# Patient Record
Sex: Female | Born: 1945 | Race: White | Hispanic: No | Marital: Married | Smoking: Never smoker
Health system: Southern US, Community
[De-identification: ages and names within clinical notes are randomized; demographics above are authoritative.]

## PROBLEM LIST (undated history)

## (undated) DIAGNOSIS — I495 Sick sinus syndrome: Secondary | ICD-10-CM

## (undated) DIAGNOSIS — C50412 Malignant neoplasm of upper-outer quadrant of left female breast: Secondary | ICD-10-CM

## (undated) DIAGNOSIS — E079 Disorder of thyroid, unspecified: Secondary | ICD-10-CM

## (undated) DIAGNOSIS — E039 Hypothyroidism, unspecified: Secondary | ICD-10-CM

## (undated) DIAGNOSIS — J309 Allergic rhinitis, unspecified: Secondary | ICD-10-CM

## (undated) DIAGNOSIS — K922 Gastrointestinal hemorrhage, unspecified: Secondary | ICD-10-CM

## (undated) DIAGNOSIS — K219 Gastro-esophageal reflux disease without esophagitis: Secondary | ICD-10-CM

## (undated) DIAGNOSIS — E785 Hyperlipidemia, unspecified: Secondary | ICD-10-CM

## (undated) DIAGNOSIS — I10 Essential (primary) hypertension: Secondary | ICD-10-CM

## (undated) DIAGNOSIS — Z950 Presence of cardiac pacemaker: Secondary | ICD-10-CM

## (undated) DIAGNOSIS — M1990 Unspecified osteoarthritis, unspecified site: Secondary | ICD-10-CM

## (undated) DIAGNOSIS — Z803 Family history of malignant neoplasm of breast: Secondary | ICD-10-CM

## (undated) DIAGNOSIS — Z923 Personal history of irradiation: Secondary | ICD-10-CM

## (undated) DIAGNOSIS — Z800 Family history of malignant neoplasm of digestive organs: Secondary | ICD-10-CM

## (undated) DIAGNOSIS — K279 Peptic ulcer, site unspecified, unspecified as acute or chronic, without hemorrhage or perforation: Secondary | ICD-10-CM

## (undated) DIAGNOSIS — Z85828 Personal history of other malignant neoplasm of skin: Secondary | ICD-10-CM

## (undated) DIAGNOSIS — M519 Unspecified thoracic, thoracolumbar and lumbosacral intervertebral disc disorder: Secondary | ICD-10-CM

## (undated) HISTORY — DX: Sick sinus syndrome: I49.5

## (undated) HISTORY — DX: Allergic rhinitis, unspecified: J30.9

## (undated) HISTORY — DX: Gastro-esophageal reflux disease without esophagitis: K21.9

## (undated) HISTORY — DX: Unspecified thoracic, thoracolumbar and lumbosacral intervertebral disc disorder: M51.9

## (undated) HISTORY — DX: Gastrointestinal hemorrhage, unspecified: K92.2

## (undated) HISTORY — PX: HERNIA REPAIR: SHX51

## (undated) HISTORY — DX: Presence of cardiac pacemaker: Z95.0

## (undated) HISTORY — DX: Hyperlipidemia, unspecified: E78.5

## (undated) HISTORY — DX: Family history of malignant neoplasm of breast: Z80.3

## (undated) HISTORY — PX: BREAST SURGERY: SHX581

## (undated) HISTORY — DX: Peptic ulcer, site unspecified, unspecified as acute or chronic, without hemorrhage or perforation: K27.9

## (undated) HISTORY — PX: ABDOMINAL HYSTERECTOMY: SHX81

## (undated) HISTORY — DX: Personal history of other malignant neoplasm of skin: Z85.828

## (undated) HISTORY — PX: PACEMAKER INSERTION: SHX728

## (undated) HISTORY — DX: Malignant neoplasm of upper-outer quadrant of left female breast: C50.412

## (undated) HISTORY — DX: Family history of malignant neoplasm of digestive organs: Z80.0

## (undated) HISTORY — DX: Hypothyroidism, unspecified: E03.9

---

## 1998-05-30 ENCOUNTER — Encounter: Admission: RE | Admit: 1998-05-30 | Discharge: 1998-06-21 | Payer: Self-pay | Admitting: Family Medicine

## 1999-06-12 ENCOUNTER — Encounter: Admission: RE | Admit: 1999-06-12 | Discharge: 1999-06-12 | Payer: Self-pay | Admitting: Obstetrics and Gynecology

## 1999-06-12 ENCOUNTER — Encounter: Payer: Self-pay | Admitting: Obstetrics and Gynecology

## 1999-07-24 ENCOUNTER — Encounter: Admission: RE | Admit: 1999-07-24 | Discharge: 1999-07-24 | Payer: Self-pay | Admitting: General Surgery

## 1999-07-24 ENCOUNTER — Encounter: Payer: Self-pay | Admitting: General Surgery

## 1999-07-26 ENCOUNTER — Ambulatory Visit (HOSPITAL_BASED_OUTPATIENT_CLINIC_OR_DEPARTMENT_OTHER): Admission: RE | Admit: 1999-07-26 | Discharge: 1999-07-26 | Payer: Self-pay | Admitting: General Surgery

## 1999-08-27 ENCOUNTER — Encounter: Admission: RE | Admit: 1999-08-27 | Discharge: 1999-08-27 | Payer: Self-pay | Admitting: Family Medicine

## 1999-08-27 ENCOUNTER — Encounter: Payer: Self-pay | Admitting: Family Medicine

## 1999-09-27 ENCOUNTER — Ambulatory Visit (HOSPITAL_BASED_OUTPATIENT_CLINIC_OR_DEPARTMENT_OTHER): Admission: RE | Admit: 1999-09-27 | Discharge: 1999-09-27 | Payer: Self-pay | Admitting: General Surgery

## 2000-01-11 ENCOUNTER — Inpatient Hospital Stay (HOSPITAL_COMMUNITY): Admission: EM | Admit: 2000-01-11 | Discharge: 2000-01-15 | Payer: Self-pay | Admitting: Emergency Medicine

## 2000-01-11 ENCOUNTER — Encounter: Payer: Self-pay | Admitting: Dentistry

## 2000-01-12 ENCOUNTER — Encounter: Payer: Self-pay | Admitting: Cardiology

## 2000-01-13 ENCOUNTER — Encounter: Payer: Self-pay | Admitting: Cardiology

## 2000-01-14 ENCOUNTER — Encounter: Payer: Self-pay | Admitting: Interventional Cardiology

## 2000-01-14 ENCOUNTER — Encounter: Payer: Self-pay | Admitting: Neurology

## 2000-01-14 ENCOUNTER — Encounter: Payer: Self-pay | Admitting: Neurosurgery

## 2000-01-15 ENCOUNTER — Encounter: Payer: Self-pay | Admitting: Interventional Cardiology

## 2000-01-16 ENCOUNTER — Encounter: Payer: Self-pay | Admitting: Interventional Cardiology

## 2000-02-17 ENCOUNTER — Encounter: Admission: RE | Admit: 2000-02-17 | Discharge: 2000-05-17 | Payer: Self-pay | Admitting: Anesthesiology

## 2001-01-25 ENCOUNTER — Encounter: Admission: RE | Admit: 2001-01-25 | Discharge: 2001-01-25 | Payer: Self-pay | Admitting: Family Medicine

## 2001-01-25 ENCOUNTER — Encounter: Payer: Self-pay | Admitting: Family Medicine

## 2001-03-26 ENCOUNTER — Ambulatory Visit (HOSPITAL_COMMUNITY): Admission: RE | Admit: 2001-03-26 | Discharge: 2001-03-26 | Payer: Self-pay | Admitting: Gastroenterology

## 2001-06-21 ENCOUNTER — Ambulatory Visit (HOSPITAL_COMMUNITY): Admission: RE | Admit: 2001-06-21 | Discharge: 2001-06-21 | Payer: Self-pay | Admitting: Gastroenterology

## 2002-05-03 ENCOUNTER — Encounter: Admission: RE | Admit: 2002-05-03 | Discharge: 2002-05-03 | Payer: Self-pay | Admitting: Family Medicine

## 2002-05-03 ENCOUNTER — Encounter: Payer: Self-pay | Admitting: Family Medicine

## 2003-02-17 ENCOUNTER — Encounter: Payer: Self-pay | Admitting: Family Medicine

## 2003-02-17 ENCOUNTER — Encounter: Admission: RE | Admit: 2003-02-17 | Discharge: 2003-02-17 | Payer: Self-pay | Admitting: Family Medicine

## 2003-08-07 ENCOUNTER — Encounter: Admission: RE | Admit: 2003-08-07 | Discharge: 2003-08-07 | Payer: Self-pay | Admitting: Family Medicine

## 2003-12-22 ENCOUNTER — Emergency Department (HOSPITAL_COMMUNITY): Admission: EM | Admit: 2003-12-22 | Discharge: 2003-12-22 | Payer: Self-pay | Admitting: Emergency Medicine

## 2004-08-23 ENCOUNTER — Encounter: Admission: RE | Admit: 2004-08-23 | Discharge: 2004-08-23 | Payer: Self-pay | Admitting: Orthopedic Surgery

## 2005-02-14 ENCOUNTER — Encounter: Admission: RE | Admit: 2005-02-14 | Discharge: 2005-02-14 | Payer: Self-pay | Admitting: Family Medicine

## 2005-10-09 ENCOUNTER — Inpatient Hospital Stay (HOSPITAL_COMMUNITY): Admission: EM | Admit: 2005-10-09 | Discharge: 2005-10-12 | Payer: Self-pay | Admitting: Internal Medicine

## 2005-10-09 ENCOUNTER — Encounter: Payer: Self-pay | Admitting: Internal Medicine

## 2006-02-06 ENCOUNTER — Ambulatory Visit: Payer: Self-pay | Admitting: Internal Medicine

## 2006-02-13 ENCOUNTER — Ambulatory Visit: Admission: RE | Admit: 2006-02-13 | Discharge: 2006-02-13 | Payer: Self-pay | Admitting: Internal Medicine

## 2006-02-13 ENCOUNTER — Ambulatory Visit: Payer: Self-pay | Admitting: Internal Medicine

## 2006-03-27 ENCOUNTER — Ambulatory Visit: Payer: Self-pay | Admitting: Internal Medicine

## 2006-04-17 ENCOUNTER — Encounter: Payer: Self-pay | Admitting: Internal Medicine

## 2006-04-17 ENCOUNTER — Ambulatory Visit: Payer: Self-pay | Admitting: Cardiovascular Disease

## 2006-04-17 ENCOUNTER — Ambulatory Visit: Payer: Self-pay

## 2006-07-09 ENCOUNTER — Ambulatory Visit (HOSPITAL_COMMUNITY): Admission: RE | Admit: 2006-07-09 | Discharge: 2006-07-09 | Payer: Self-pay | Admitting: Cardiology

## 2007-01-18 ENCOUNTER — Encounter: Payer: Self-pay | Admitting: Interventional Cardiology

## 2007-01-18 ENCOUNTER — Inpatient Hospital Stay (HOSPITAL_COMMUNITY): Admission: EM | Admit: 2007-01-18 | Discharge: 2007-01-19 | Payer: Self-pay | Admitting: Emergency Medicine

## 2007-03-29 ENCOUNTER — Encounter: Admission: RE | Admit: 2007-03-29 | Discharge: 2007-05-13 | Payer: Self-pay | Admitting: Family Medicine

## 2008-11-08 DIAGNOSIS — Z85828 Personal history of other malignant neoplasm of skin: Secondary | ICD-10-CM

## 2008-11-08 HISTORY — DX: Personal history of other malignant neoplasm of skin: Z85.828

## 2010-06-22 ENCOUNTER — Encounter: Payer: Self-pay | Admitting: Family Medicine

## 2010-10-15 NOTE — Discharge Summary (Signed)
[name], [name]                 ACCOUNT NO.:  [id number]   MEDICAL RECORD NO.:  [id number]          PATIENT TYPE:  INP   LOCATION:  1444                         FACILITY:  [facility]   PHYSICIAN:  Barnetta Chapel, MDDATE OF BIRTH:  14-Jan-1946   DATE OF ADMISSION:  01/18/2007  DATE OF DISCHARGE:  01/19/2007                               DISCHARGE SUMMARY   PRIMARY CARE PHYSICIAN:  Caryn Bee L. Little, M.D.   DISCHARGE DIAGNOSES:  1. Chest pain, likely musculoskeletal in origin.  2. Hypertension, well controlled.  3. Dyslipidemia.   CONSULTATIONS:  Cardiology.  The patient was seen by Dr. Katrinka Blazing.  The  patient had a CT angiogram of the coronaries.  This was a very poor  study.  It showed calcification in the left main, LAD, and circumflex  arteries.  Left ventricular function was said to be  normal.   PROCEDURES:  1. CT angiogram of the coronaries.  2. Cardiac catheterization which revealed normal coronaries.   BRIEF HISTORY AND HOSPITAL COURSE:  Please refer to the H&P done on  January 18, 2007.  The patient is 65 years old with past medical history  significant for hypertension, hyperlipidemia, hypothyroidism, and status  post atrial pacemaker placement for sick sinus syndrome.  The patient  presented with chest pain which was felt to be atypical.  However,  considering the patient's risk factors, the patient was admitted for  further cardiac workup.   The patient was admitted to the telemetry floor.  A cardiology consult  was called, and the patient was seen by Dr. Katrinka Blazing.  The patient had a CT  angiography of coronaries which was inconclusive.  Cardiac  catheterization was done today, and it revealed normal coronaries.  The  patient is chest pain free. She will be discharged home today to the  care of her primary care physician.   DISCHARGE MEDICATIONS:  1. Enteric-coated aspirin 81 mg p.o. once daily.  2. Darvocet-N 100 one tablet p.o. q. 6 h p.r.n. (28 tablets  prescribed).  3. Lipitor 80 mg p.o. once daily.  4. TriCor 145mg  p.o. once daily.  5. Synthroid 100 mcg p.o. once daily.  6. Hydrochlorothiazide.  7. Nexium 40 mg p.o. b.i.d.  8. Nasonex 2 sprays to each nostril once daily p.r.n.  9. MiraLax 17 grams p.o. once daily p.r.n.  10.Flovent 2 puffs b.i.d.   DISCHARGE PLAN:  1. Discharge patient home on above medications.  2. Follow up with primary care physician, Dr. Catha Gosselin, in a week.  3. Cardiac diet.  4. Activity as tolerated.      Barnetta Chapel, MD  Electronically Signed     SIO/MEDQ  D:  01/19/2007  T:  01/19/2007  Job:  [id number]   cc:   Caryn Bee L. Little, M.D.  Fax: [phone]

## 2010-10-15 NOTE — Consult Note (Signed)
[name], [name]                 ACCOUNT NO.:  [id number]   MEDICAL RECORD NO.:  [id number]          PATIENT TYPE:  INP   LOCATION:  1444                         FACILITY:  [facility]   PHYSICIAN:  Lyn Records, M.D.   DATE OF BIRTH:  10-16-1945   DATE OF CONSULTATION:  01/18/2007  DATE OF DISCHARGE:                                 CONSULTATION   REASON FOR CONSULTATION:  Chest pain.   CONCLUSION:  1. Prolonged chest pain involving left shoulder, neck, back, and arm.      Atypical features suggesting low to intermediate risk for coronary      disease.  Myocardial infarction has been ruled out.  2. Sick sinus syndrome, status post DDD pacemaker therapy      approximately 10 years ago.  3. Hypertension.  4. Hypothyroidism.  5. Hypercholesterolemia.   RECOMMENDATIONS:  1. We will administer a low-dose beta-blocker in the form of      metoprolol 25 mg.  2. Multi-slice coronary CT angiography will be done to rule out      significant obstructive coronary disease in this patient who is      unable to exercise on a treadmill because of pacemaker therapy, and      unable to receive adenosine because of asthma.  We were not able to      get her heart rate with pharmacologic stress either.  3. We will start and antecubital IV.  We will give 25 mg of metoprolol      orally, and we will perform CTA after sublingual nitroglycerine      administration later this afternoon.   COMMENTS:  The patient is 25, and has no prior history of coronary  artery disease.  She has sinus node dysfunction, and underwent DDD  pacemaker insertion approximately 10 years ago for chronotropic  incompetence and sinus node dysfunction.  She has done relatively well.  She has had some exertional dyspnea over the years.  She had a stress  Cardiolite performed with adenosine pharmacologic stress approximately 2  years ago that was apparently negative for evidence of ischemia.  She is  admitted to the hospital at this  time after beginning to have shoulder  and chest discomfort at bedtime.  Discomfort became more severe during  the night.  It radiated into the neck, arm, and into the back.  She was  brought to the emergency room at [facility] were morphine and  nitroglycerine was given, and she had gradual resolution of the  discomfort.  She had mild recurrence of chest tightness this morning.  She currently feels pain-free.   HABITS:  Does not smoke or drink.   SIGNIFICANT MEDICAL PROBLEMS:  1. See above.  2. Asthma, not mentioned above.   FAMILY HISTORY:  Father had strokes.  He died in his 9s.  No siblings,  and her mother did not have coronary disease.   VITAL SIGNS:  Blood pressure 128/90, heart rate 60, O2 saturation 98%.  NECK:  Exam reveals no JVD or carotid bruits.  LUNGS:  Clear.  CARDIAC:  No murmur.  No rub.  No click.  No gallop.  ABDOMEN:  Soft.  EXTREMITIES:  No edema.  Pulses are 2+ and symmetric in the upper and  lower extremities.   BUN and creatinine are 17 and 0.75.  Potassium is 3.8.  Hemoglobin is  13.2.  Cardiac markers are unremarkable.  EKG reveals atrial pacing  without significant ventricular pacing at a rate of 60 beats per minute.  Chest x-ray:  No active disease.   DISCUSSION:  The patient is 66, has moderate risk factors for coronary  artery disease.  Presented with a somewhat atypical story for coronary  artery disease, and was judged to have low to intermediate probability  pretest likelihood.  We are unable to do a stress Cardiolite today.  She  has had adenosine Cardiolites in the past.  We will go ahead and perform  a coronary CTA for expediency sake, and hopefully get the patient  discharged later today.      Lyn Records, M.D.  Electronically Signed     HWS/MEDQ  D:  01/18/2007  T:  01/18/2007  Job:  [id number]   cc:   Caryn Bee L. Little, M.D.  Fax: [phone]

## 2010-10-15 NOTE — Cardiovascular Report (Signed)
[name], [name]                 ACCOUNT NO.:  [id number]   MEDICAL RECORD NO.:  [id number]          PATIENT TYPE:  OUT   LOCATION:  CATH                         FACILITY:  [facility]   PHYSICIAN:  Lyn Records, M.D.   DATE OF BIRTH:  01-19-1946   DATE OF PROCEDURE:  01/19/2007  DATE OF DISCHARGE:                            CARDIAC CATHETERIZATION   INDICATION FOR STUDY:  Chest discomfort, recurrent, with atypical  features and felt to be intermediate probability recent coronary CTA  demonstrating heavy circumflex, left main and LAD calcification. Study  is being done to define coronary anatomy and rule out obstructive  disease.   PROCEDURE PERFORMED:  1. Left heart catheterization.  2. Selective coronary angiogram.  3. Left ventriculography.   OPERATOR:  Jake Bathe, M.D. and Lyn Records, M.D.   DESCRIPTION:  A 6-French sheath was placed in the right femoral artery  after 2 mg of IV Versed and 15 mcg of fentanyl.  Preformed 6-French  Judkins catheters were used for left and right coronary angiography and  an angled pigtail for left ventriculography.  The case was terminated  without complications.   RESULTS:  1. Hemodynamic data:.      a.     Aortic pressure 127/74.      b.     Left ventricular pressure 127/16.  2. Left ventriculography:  Left ventricular size and function are      normal.  No mitral regurgitation is noted.  EF is 70%.  3. Coronary angiography.      a.     Left main coronary:  Calcified, widely patent.      b.     Left anterior descending coronary artery:  The left anterior       descending coronary artery contains moderate calcification       proximally.  The vessel ends on the LV apex.  No significant       obstruction is seen.  There is up to 30-40% mid vessel narrowing.      c.     Circumflex:  Circumflex is dominant, gives origin to the PDA       and three large obtuse marginal branches.  Irregularities are       noted.  No high-grade obstruction  is seen.      d.     Right coronary artery:  Right coronary is nondominant,       ending in the mid right atrioventricular groove.   CONCLUSION:  1. Patent coronaries with evidence of atherosclerosis but no      obstructive lesions.  2. Normal left ventricular function.  3. Chest discomfort, noncardiac.   PLAN:  Discharge later today.      Lyn Records, M.D.  Electronically Signed     HWS/MEDQ  D:  01/19/2007  T:  01/19/2007  Job:  [id number]   cc:   Caryn Bee L. Little, M.D.

## 2010-10-15 NOTE — H&P (Signed)
NAMERAELEEN, WINSTANLEY                 ACCOUNT NO.:  [id number]   MEDICAL RECORD NO.:  [id number]          PATIENT TYPE:  EMS   LOCATION:  ED                           [facility]ACILITY:  [facility]   PHYSICIAN:  Barnetta Chapel, MDDATE OF BIRTH:  1946/04/01   DATE OF ADMISSION:  01/18/2007  DATE OF DISCHARGE:                              HISTORY & PHYSICAL   PRIMARY CARE PHYSICIAN:  Caryn Bee L. Little, M.D.   CHIEF COMPLAINT:  Chest discomfort.   HISTORY OF PRESENT ILLNESS:  The patient is a 65 year old white female  with past medical history of hypertension, hyperlipidemia, and atrial  pacemaker secondary to sick sinus syndrome who has had no previous  episodes of chest discomfort, and then starting tonight, on the night of  January 17, 2007, she started having what she describes as throbbing  above her left breast.  It started radiating down her left arm, with  some numbness.  She also had some mild associated shortness of breath.  She became concerned and came into the emergency room for further  evaluation.  In the emergency room, she was noted to have an EKG showing  some nonspecific T-wave abnormalities with essentially normal sinus  rhythm, specifically an electronic atrial paced rhythm.  She received  morphine and nitroglycerin in the emergency room, which she said helped  ease her pain off completely away.  She currently is doing well.  She  denies any headaches, vision changes, dysphagia, chest pain,  palpitations, shortness of breath, wheeze, cough, abdominal pain,  hematuria, dysuria, constipation, diarrhea, focal extremity numbness,  weakness, or pain.  Review of systems otherwise negative.  Her cardiac  markers, chest x-ray, and rest of labs were unremarkable.   PAST MEDICAL HISTORY:  1. Hypertension.  2. Hyperlipidemia.  3. Hypothyroidism.  4. Status post atrial pacemaker for sick sinus syndrome.   MEDICATIONS:  HCTZ, Lipitor, TriCor, Synthroid, Pulmicort, Flovent,  Nasonex.   ALLERGIES:  1. PENICILLIN.  2. TUSSIN.   SOCIAL HISTORY:  She denies any tobacco, alcohol, or drug use.   FAMILY HISTORY:  Noncontributory.   PHYSICAL EXAMINATION:  VITAL SIGNS:  Temperature 97.1, heart rate 61,  blood pressure 141/84, respirations 20, O2 saturations 99% on room air.  GENERAL:  The patient is alert and oriented x3, in no apparent distress.  HEENT:  Normocephalic, atraumatic.  Her mucous membranes are moist.  NECK:  She has no carotid bruits.  HEART:  Regular rate and rhythm.  S1, S2.  LUNGS:  Clear to auscultation bilaterally.  ABDOMEN:  Soft, nontender, nondistended.  Positive bowel sounds.  EXTREMITIES:  She had no clubbing, cyanosis, or edema.   LABORATORY WORK:  Sodium 141, potassium 3.8, chloride 110, bicarbonate  25, BUN 17, creatinine 0.8, glucose 102.  White count 5, H&H 13 and 38,  MCV 87, platelet count 326, no shift.  D-dimer 0.27.  CPK 41.5, MB less  than 1, troponin less than 0.5, second set is similar.  EKG and chest x-  ray as per HPI.   ASSESSMENT AND PLAN:  1. Chest pain.  The patient has multiple risk  factors, including age,      lipids, and hypertension.  Reportedly, her blood pressure at home      prior to coming in was in the 160s/110.  We will plan to check two      more sets of cardiac markers and, if negative, plan for a      Cardiolite stress test.  2. Hypertension.  Continue p.o. medication.  3. Hypothyroidism.  Continue Synthroid.      Hollice Espy, M.D.      Barnetta Chapel, MD  Electronically Signed    SKK/MEDQ  D:  01/18/2007  T:  01/18/2007  Job:  [id number]   cc:   Caryn Bee L. Little, M.D.  Fax: [phone]

## 2010-10-18 NOTE — Op Note (Signed)
[name], [name]                 ACCOUNT NO.:  [id number]   MEDICAL RECORD NO.:  [id number]          PATIENT TYPE:  INP   LOCATION:  3310                         FACILITY:  [facility]   PHYSICIAN:  John C. Madilyn Fireman, M.D.    DATE OF BIRTH:  28-Dec-1945   DATE OF PROCEDURE:  10/10/2005  DATE OF DISCHARGE:                                 OPERATIVE REPORT   PROCEDURE:  Esophagogastroduodenoscopy.   ENDOSCOPIST:  Everardo All. Madilyn Fireman, M.D.   INDICATIONS FOR PROCEDURE:  Continued GI bleeding with upper source  suspected and EGD yesterday nondiagnostic but showing some coffee-ground  material in the stomach.  Pooled RBC scan today was negative but the patient  continues to pass significant volumes of bloody stools and remains  orthostatic with an elevated BUN relative to creatinine and there is  suspicion of missed upper GI tract lesion prompting today's procedure with  the pediatric colonoscope for more distal visualization of small bowel.   DESCRIPTION OF PROCEDURE:  The patient was placed in the left lateral  decubitus position and placed on the pulse monitor with continuous low-flow  oxygen delivered by nasal cannula.  She was sedated with 50 mcg IV fentanyl  and 5 mg IV Versed.  The Olympus video pediatric colonoscope was passed  under direct vision into the oropharynx and esophagus.  The esophagus was  straight and of normal caliber at squamocolumnar line at 38 cm.  There was  no visible hiatal hernia, ring, stricture or other abnormality of the  esophagus or GE junction.  The stomach was entered and a small amount of  liquid secretions were suctioned from the he fundus.  A retroflexed view of  the cardia was unremarkable.  The fundus showed evidence of a previous  Nissen fundoplication and was otherwise unremarkable.  The fundus and body  appeared normal.  In the antrum there was seen a small crescent shaped  antral erosion with no stigma of hemorrhage as seen on yesterday's  procedure.  On  this occasion no coffee-ground material was seen anywhere in  the stomach and no blood seen in the stomach, duodenum or esophagus.  The  pylorus was traversed and both the bulb and second portion were well  inspected and appeared to be within normal limits.  The scope was able to be  advanced for a significant length down into the distal duodenum.  It was  unclear whether the ligament Treitz was traversed but with the scope  inserted its furthest distance, I did not see any old or fresh blood or any  potential bleeding sites.  The  scope were then withdrawn and the patient  returned to the recovery room in stable condition.  She tolerated the  procedure well, and there were no immediate complications.   IMPRESSION:  Basically normal study with partial small bowel enteroscopy.   PLAN:  At this point we will need to more strongly consider lower GI source  and will arrange for a colonoscopy after a purge.           ______________________________  Everardo All. Madilyn Fireman, M.D.  JCH/MEDQ  D:  10/10/2005  T:  10/11/2005  Job:  [id number]   cc:   Griffith Citron, M.D.  Fax: [phone]   Anna Genre Little, M.D.  Fax: [phone]   Lyn Records, M.D.  Fax: [phone]

## 2010-10-18 NOTE — Op Note (Signed)
NAMEJOMAIRA, [name]                 ACCOUNT NO.:  [id number]   MEDICAL RECORD NO.:  [id number]          PATIENT TYPE:  INP   LOCATION:  3310                         FACILITY:  [facility]   PHYSICIAN:  Shirley Friar, MDDATE OF BIRTH:  Jan 16, 1946   DATE OF PROCEDURE:  DATE OF DISCHARGE:  10/12/2005                                 OPERATIVE REPORT   PROCEDURE:  Colonoscopy.   INDICATIONS:  Melena, GI bleed.   MEDICATIONS:  Fentanyl 100 mcg IV, Versed 7 mg IV.   FINDINGS:  Rectal exam was normal.  The adult adjustable colonoscope was  inserted and advanced through a tortuous and redundant colon to the cecum.  Prior to reaching the cecum, abdominal pressure and changing the patient's  position to the supine position was necessary.  Once reaching the cecum, the  ileocecal valve and appendiceal orifice were identified.  No bleeding and no  blood was noted throughout the colon.  The terminal ileum was intubated and  was normal in appearance and the endoscope was advanced approximately 10 cm  up into the terminal ileum and no abnormalities were seen.  Careful  withdrawal of the colonoscope revealed no abnormalities and no bleeding.  Retroflexion showed small internal hemorrhoids but otherwise normal  colonoscopy.   ASSESSMENT:  1.  Small internal hemorrhoids.  2.  No bleeding seen.  3.  5.  At at this no  4.  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   PLAN:  1.  Recommend outpatient small bowel capsule endoscopy unless bleeding      recurs, and then would recommend inpatient small bowel endoscopy if the      patient remains hemodynamically stable  2.  Clear liquid diet.  3.  Check the H&H's.  4.  The patient needs to avoid all NSAIDs.      Shirley Friar, MD  Electronically Signed     VCS/MEDQ  D:  10/11/2005  T:  10/13/2005  Job:  [id number]   cc:   Everardo All. Madilyn Fireman, M.D.  Fax: [phone]

## 2010-10-18 NOTE — H&P (Signed)
NAMECHERE, BABSON                 ACCOUNT NO.:  [id number]   MEDICAL RECORD NO.:  [id number]          PATIENT TYPE:  EMS   LOCATION:  ED                           [facility]ACILITY:  [facility]   PHYSICIAN:  Jackie Plum, M.D.DATE OF BIRTH:  May 25, 1946   DATE OF ADMISSION:  10/09/2005  DATE OF DISCHARGE:                                HISTORY & PHYSICAL   CHIEF COMPLAINT:  Bloody stools.   HISTORY OF PRESENT ILLNESS:  The patient is a 65 year old lady with a  history of hypertension, dyslipidemia, allergies and sick sinus syndrome  status post pacemaker placement many years.  According to the patient, she  was in her usual health until this morning when she noted bloody, dark clots  in her stools.  She called her PCP who referred her to the emergency room.  Patient gives a history generalized weakness and diaphoresis.  Denied any  chest pain or shortness of breath.  She had felt dizzy a little bit.  She  has not had any dysuria, frequency, micturition, cough or sputum production.  She has not had any abdomen pain.  She has also had some epigastric pain  without any nausea and vomiting.  No fever or chills.  No recent weight gain  or weight loss.  In the emergency room, the patient was seen by Dr. Joen Laura who requested that the patient be admitted for further evaluation of  her GI bleed to the hospitalist service.   PAST MEDICAL HISTORY:  As listed above.   MEDICATIONS:  Patient is on aspirin, Lipitor, TriCor, Maxzide, Nexium,  Synthroid and MiraLax.   FAMILY HISTORY:  Positive for heart disease.  There is no family history of  bowel cancer.   SOCIAL HISTORY:  Patient is married.  Does not smoke cigarettes.  Does not  drink alcohol.   REVIEW OF SYSTEMS:  As listed above.  Otherwise unremarkable.   PHYSICAL EXAMINATION:  VITAL SIGNS:  BP 107/68, temperature 98.8 degrees  Fahrenheit (temperature was 99.1 degrees Fahrenheit earlier).  Pulse rate of  74 (pulse rate of 96  __________ ), respiratory rate 20, O2 sat of 99%.  GENERAL:  Patient was not in acute cardiopulmonary distress.  HEENT:  Normocephalic, atraumatic.  Pupils were equal, round and reactive to  light.  Extraocular movements were intact.  Her oropharynx was moist.  She  had mild scleral pallor without icterus.  NECK:  Supple.  No JVD.  LUNGS:  Clear to auscultation.  CARDIAC:  Regular.  No gallops.  ABDOMEN:  Patient had some mild epigastric tenderness without any guarding  or rebound tenderness.  EXTREMITIES:  No cyanosis or edema.  CNS:  Nonfocal.   LABORATORY DATA:  WBC count 11.6, hemoglobin 10.4, hematocrit 30.5, MCV  85.2, platelet count 455,000.  Sodium  137, potassium 3.9, chloride 104, CO2  37, glucose 163, BUN 49.0, creatinine 0.8, calcium 8.5.   IMPRESSION:  1.  Gastrointestinal hemorrhage.  2.  Anemia of blood loss.  3.  History of sick sinus syndrome status post pacemaker placement.  4.  Hypertension.   PLAN:  Patient  gives history of previous progressive ulcer, and has been  seen by Dr. Kinnie Scales who is actually not in town at this moment.  We will  admit her for serial monitoring of her H&H with p.r.n. packed red blood cell  transfusion.  We will give her some proton-pump inhibitors IV and call Dr.  Madilyn Fireman to take a look at this patient for possible upper endoscopy.      Jackie Plum, M.D.  Electronically Signed     GO/MEDQ  D:  10/09/2005  T:  10/09/2005  Job:  [id number]

## 2010-10-18 NOTE — Procedures (Signed)
[facility]  Patient:    [name], [name]                        MRN: [id number] Proc. Date: 02/17/00 Adm. Date:  [id number] Attending:  Thyra Breed CC:         Harvie Junior, M.D.   Procedure Report  PREOPERATIVE DIAGNOSIS:  Cervical spondylosis.  POSTOPERATIVE DIAGNOSIS:  Cervical spondylosis.  PROCEDURE:  Cervical epidural steroid injection.  SURGEON:  Thyra Breed, M.D.  INDICATIONS:  The patient is a very pleasant 65 year old who was sent to [place] by Dr. Luiz Blare for a cervical epidural steroid injection.  The patient states that she was in her usual state of health up until August when she developed a two week history of neck discomfort characterized as a sharp pain radiating out into her shoulder blades and into the chest.  She was seen by her primary care physician, Dr. Clarene Duke who hospitalized her to rule out an MI.  The course of the hospitalization includes an evaluation by Dr. Newell Coral who performed a cervical myelogram which showed small central disk protrusion at C5-6 with uncovertebral spurring at C6-7 with no neuroforaminal encroachment.  She was later seen by Dr. Luiz Blare who did a trigger point injection and placed her in physical therapy.  She does to physical therapy three times a week.  She has been tried on Neurontin and prednisone which partially helped her, and Vioxx which did nothing for her pain, and eventually has been on Lorcet and Flexeril which has eased the pain in combination with the physical therapy.  She continues to have a throbbing dull discomfort which is constant.  She does not know what makes it worse, but it is improved by the medications and physical therapy.  She has numbness and tingling into the third and fourth digits of the left upper extremity, as well as intermittent weakness.  She denies any bowel or bladder incontinence.  CURRENT MEDICATIONS:  Lipitor, atenolol, hydrochlorothiazide, Premarin, Lorcet,  Flexeril, aspirin, and Synthroid.  ALLERGIES:  PENICILLIN.  FAMILY HISTORY:  Positive for coronary artery disease, thyroid disease, hypertension, and liver cancer.  PAST SURGICAL HISTORY:  Significant for pacemaker placement in 1998 for sick sinus syndrome, hysterectomy, Nissen fundoplication, and lumpectomy.  ACTIVE MEDICAL PROBLEMS:  Migraine, sick sinus syndrome, hypertension, osteoarthritis, hypothyroidism, and allergic rhinitis.  REVIEW OF SYSTEMS:  General negative.  Head significant for headache which began last night with a long history of migraine headaches.  Her headache has more of an occipital distribution today and I suspect it may be partially related to her underlying neck problems.  Eyes negative.  Nose, mouth, and throat negative.  Ears negative.  Pulmonary negative.  Cardiovascular - see active medical problems.  GI - history of gastroesophageal reflux, status post Nissen fundoplication with no recurrence of symptoms.  GU negative. Musculoskeletal - the patient has pain in the small joints of the hands and feet, as well as shoulders.  Has been told she has some osteoarthritis there. Neurologic - see HPI for pertinent positives.  No history of seizure or stroke.  Hematologic negative.  Cutaneous negative.  Endocrine - chronic hypothyroidism.  Psychiatric negative.  Allergy and immunology positive for allergies to _______ and pollens as characterized by nose and eye symptoms.  PHYSICAL EXAMINATION:  Blood pressure 152/78, heart rate 66, respiratory rate 14, and O2 saturations 95%.  Temperature is 97 degrees.  Pain level is 7 out of 10.  Head was  normocephalic and atraumatic.  Eyes - extraocular movements intact with conjunctivae and sclerae clear.  Nose patent nares without discharge.  Oropharynx was free of lesions.  Neck demonstrated positive Spurlings sign with mild restriction in range of motion.  Lungs were clear. Heart was regular rate and rhythm.  Breasts,  abdomen, genitalia, and rectal exams were not performed.  Back exam revealed a negative straight leg raise sign with intact gait.  Extremities with no cyanosis, clubbing, or edema. Radial pulses and dorsalis pedis pulses are 2+ and symmetric.  She had bony enlargement of the first MPPs of the feet.  Neurologic - the patient was oriented x 4.  Cranial nerves 2-12 are grossly intact.  Deep tendon reflexes were symmetric in the upper and lower extremities with downgoing toes.  Motor was significant for symmetric bulk and tone with equal strength except for some mild triceps weakness.  Sensory exam revealed attenuated pinprick over the dorsum of the hand, over the third and fourth digits.  IMPRESSION: 1. Cervical radiculopathy of what appears to be C7 with underlying cervical    spondylosis and myelogram confirming this. 2. Multiple other medical problems which include sick sinus syndrome for which    he is currently pacemaker dependent, hypothyroidism, migraine headaches,    hypertension, osteoarthritis, and allergic rhinitis.  DISPOSITION:  I discussed the potential risks, benefits, and limitations of a cervical epidural steroid injection in detail with the patient as well as reviewed the side effects of corticosteroids.  She wishes to proceed.  DESCRIPTION OF PROCEDURE:  After informed consent was obtained, the patient was placed in the left lateral decubitus position and monitored.  Her neck was prepped with Betadine x 3.  The skin level was raised at the C7-T1 interspace with 1% lidocaine.  A 20-gauge Tuohy needle was introduced to the cervical epidural space with loss of resistance to preservative-free normal saline. There was no CSF nor blood.  Forty milligrams and Medrol and 3 cc of preservative-free normal saline was gently injected.  The needle was flushed with preservative-free normal saline and removed intact.  POSTPROCEDURE CONDITION:  Stable.  DISCHARGE INSTRUCTIONS: 1.  Resume previous diet. 2. Limitation and activities per instruction sheet. 3. Continue with current medications.  4. The patient plans to follow up with Dr. Luiz Blare.  I advised the patient that    cervical epidural steroid injections are not done in a series of three, but    in a single shot.  There is no added advantage to doing a series of three    in the literature. DD:  02/17/00 TD:  02/18/00 Job: [id number]

## 2010-10-18 NOTE — Op Note (Signed)
[name], [name]                 ACCOUNT NO.:  [id number]   MEDICAL RECORD NO.:  [id number]          PATIENT TYPE:  INP   LOCATION:  0104                         FACILITY:  [facility]   PHYSICIAN:  John C. Madilyn Fireman, M.D.    DATE OF BIRTH:  Jun 29, 1945   DATE OF PROCEDURE:  10/09/2005  DATE OF DISCHARGE:                                 OPERATIVE REPORT   INDICATIONS FOR PROCEDURE:  Clinically significant GI bleeding with upper  source suggested.   PROCEDURE:  The patient was placed in the left lateral decubitus position  and placed on the pulse monitor with continuous low-flow oxygen delivered by  nasal cannula.  He was sedated with 50 mcg IV fentanyl and 4 mg IV Versed.  Olympus video endoscope was advanced under direct vision into the  oropharynx, and esophagus.  The esophagus was straight and of normal caliber  with the squamocolumnar line at 38 cm.  There was no visible hiatal hernia,  ring, stricture, Mallory-Weiss tear, or other abnormality of the GE  junction.  The stomach was entered and there was a light amount of strandy,  coffee-grounds material scattered along several places in the stomach,  rather diffusely; but there was no definite body of pooled coffee-grounds  black or bloody emesis, and no blood clots, and no fresh blood seen.  A  considerable amount of time was spent washing these areas carefully for an  underlying vascular lesion, or other stigma of hemorrhage.  A fairly good  view of the stomach was obtained; and the only thing that was seen was a  small shallow prepyloric erosion with some exudate, but no stigma of  hemorrhage at all.  There was evidence of previous fundoplication seen on  retroflex view.   The pylorus was not deformed and easily allowed passage of the endoscope tip  into the duodenum.  Both bulb and second portion were well inspected and  appeared to be within normal limits without any significant blood.  These  areas were all examined very carefully  and repeatedly for any high-risk  bleeding lesions.  The scope was then withdrawn; and the patient returned to  the recovery room in stable condition.  She tolerated the procedure well and  there were no immediate complications.   IMPRESSION:  Small gastric erosion with evidence of previous upper  gastrointestinal bleeding as evidenced by coffee-grounds material in the  stomach but no definite bleeding source seen.   PLAN:  Will monitor very carefully; treat with proton pump inhibitor, hold  aspirin and NSAIDS, and consider colonoscopy, consider possible small bowel  capsule endoscopy, or even repeat endoscopy as situation warrants.           ______________________________  Everardo All. Madilyn Fireman, M.D.     JCH/MEDQ  D:  10/09/2005  T:  10/10/2005  Job:  [id number]   cc:   Griffith Citron, M.D.  Fax: [phone]   Anna Genre Little, M.D.  Fax: [phone]   Lyn Records, M.D.  Fax: [phone]

## 2010-10-18 NOTE — Op Note (Signed)
[name], Alexis                 ACCOUNT NO.:  [id number]   MEDICAL RECORD NO.:  [id number]          PATIENT TYPE:  OUT   LOCATION:  CARD                         FACILITY:  [facility]   PHYSICIAN:  Oley Balm. Sung Amabile, MD   DATE OF BIRTH:  [birth year]   DATE OF PROCEDURE:  02/13/2006  DATE OF DISCHARGE:  02/13/2006                                 OPERATIVE REPORT   INDICATION FOR TESTING:  Dyspnea.   PROCEDURE:  Cardiopulmonary stress testing was performed on a graded  treadmill.  Testing was stopped due to dyspnea and fatigue.  Effort was  submaximal.  At peak exercise oxygen uptake was 21.2 mL/kg per minute or 83%  of predicted maximum indicating low normal exercise tolerance.   At peak exercise, heart rate was 129 beats per minute or 81% of predicted  maximum indicating that cardiovascular limitation was approached.  Oxygen  pulse was normal suggesting normal stroke volume.  Blood pressure response  was normal.  EKG tracings revealed no ischemic changes and no arrhythmias.   At peak exercise minute ventilation was 46.4 per liters per minute or 80% of  predicted maximum indicating that ventilatory limitation was approached or  reached.  Gas exchange parameters revealed no abnormalities.  Baseline  pulmonary function tests revealed borderline restriction.  Postexercise  spirometry revealed a 24% decrease in FEV-1.   SUMMARY:  Low normal exercise tolerance.  Cardiovascular and ventilatory limitations  were approached simultaneously.  There was a 24% decrease in FEV-1  postexercise, suggesting possible exercise-induced bronchospasm.  However  spirometry test performance appears to have been suboptimal based on the  flow volume curves.  If not already considered, would consider a trial  bronchodilator therapy prior to exertion to see if this improves exercise  tolerance.           ______________________________  Oley Balm. Sung Amabile, MD     DBS/MEDQ  D:  02/22/2006  T:  02/24/2006   Job:  [id number]   cc:   Charlaine Dalton. Sherene Sires, MD, FCCP  [address]  [place]  [place] [address]

## 2010-10-18 NOTE — Consult Note (Signed)
[place]  Patient:    [name], [name]                        MRN: [id number] Proc. Date: 01/13/00 Adm. Date:  [id number] Attending:  Lyn Records. Iii CC:         Kevin L. Little, M.D.  Darci Needle, M.D.   Consultation Report  HISTORY OF PRESENT ILLNESS:  The patient is a 65 year old right-handed white female who was admitted two days ago for rule out MI.  Her symptoms began five days ago, when she developed pain running down through her left upper extremity, through the left shoulder, arm, and forearm.  She had some sense of weakness in the left hand.  Initially the symptoms were mild, but two days ago the pain worsened and became increasingly throbbing and unbearable.  She had pain not only through the left upper extremity but also in the left anterior superior chest and the left scapular region.  She described tightness in her neck, and she presented to the emergency room concerned about the possibility of cardiac ischemia.  The patient was evaluated by Dr. Cassell Clement and admitted to the hospital for rule out MI.  The patient has undergone extensive cardiac workup, including a Cardiolite stress test.  The adenosine Cardiolite was felt to be normal.  There was no ischemia, showing an ejection fraction of 62%.  Despite that, though, she has continued to have pain radiating through the left upper extremity as well as in the left anterior superior chest and left parascapular regions.  She continues to have a sense of weakness in her left hand and numbness and tingling in the left hand digits, less so prominently in the left third and fourth digits.  Neurosurgical consultation was requested by Dr. Verdis Prime for evaluation of possible cervical radiculopathy.  PAST MEDICAL HISTORY:  Notable for history of sick sinus syndrome, for which she had a pacemaker placed five years ago.  Also a history of  hypertension, hypercholesterolemia.  Also notable for a history of migraines, is otherwise unremarkable.  PAST SURGICAL HISTORY:  Previous surgeries include hiatal hernia surgery eight years ago by Dr. Orpah Greek, inguinal herniorrhaphy by Dr. Lindie Spruce, pacemaker placement five years ago for sick sinus syndrome by Dr. Amil Amen, vaginal hysterectomy in 1993, breast biopsy.  ALLERGIES:  PENICILLIN.  MEDICATIONS:  Medications at the time of admission included Lipitor 40 mg q.d., atenolol q.d., hydrochlorothiazide 25/triamterene 37.5 mg q.d., Premarin q.d., aspirin q.d.  FAMILY HISTORY:  Her mother has a history of heart disease and is age 58.  Her father has a history of multiple strokes and is age 21.  Her sister had a carotid endarterectomy at age 62.  SOCIAL HISTORY:  The patient works for [employer].  She has never smoked.  She has occasional wine.  She is married and has two children.  REVIEW OF SYSTEMS:  Negative except as described in her history of present illness.  PHYSICAL EXAMINATION:  GENERAL:  The patient is a well-developed, well-nourished white female in no acute distress.  VITAL SIGNS:  Height is 5 feet 4 inches, weight 165 pounds.  Temperature is 97.6, pulse 64, blood pressure 110/56, respiratory rate 16.  LUNGS:  Clear to auscultation.  She has symmetrical respiratory excursion.  HEART:  Regular rhythm with a normal S1, S2.  There is no murmur.  ABDOMEN:  Soft, nondistended.  Bowel sounds are present.  EXTREMITIES:  No clubbing, cyanosis, or edema.  MUSCULOSKELETAL:  No tenderness to palpation over the cervical spinous processes or paracervical musculature.  She has a good range of motion in the neck.  There is mild discomfort on range of motion of the neck.  NEUROLOGIC:  Motor exam with significant weakness in the left upper extremity, the deltoid and biceps are 5, but the triceps was 3, the intrinsics are 5, the grip is 4, and the wrist extensor 4-.   The corresponding strength in the right upper extremity is 5/5, including the deltoid, biceps, triceps, intrinsics, grip, and wrist extensor.  Sensation is diminished to pinprick in the third, fourth, and fifth digits of the left hand.  Reflexes are 2 at the biceps and brachioradialis, triceps, quadriceps, and gastrocnemius, and symmetrical bilaterally.  The toes are downgoing bilaterally.  IMPRESSION:  Probable left cervical radiculopathy, etiology uncertain.  RECOMMENDATIONS:  Discussed my assessment and impression with the patient, her husband and her sister, both of whom were present.  I do feel that a workup is reasonable.  Unfortunately, she cannot undergo MRI scan of the cervical spine due to her pacemaker, and therefore will need to consider cervical myelogram plus will get a CT scan as well as a cervical spine x-ray series with flexion-extension views.  I have discussed with them the nature of myelogram and postmyelogram CT scan, including the risks of postmyelogram syndrome and allergic reaction to the contrast material.  _____ all this, the patient does want to proceed.  Will plan on scheduling this for the morning so as to facilitate further treatment. DD:  01/13/00 TD:  01/14/00 Job: [id number]

## 2010-10-18 NOTE — Assessment & Plan Note (Signed)
[facility]                               PULMONARY OFFICE NOTE   [name], [name]                        MRN:          [id number]  DATE:03/27/2006                            DOB:          06-25-1945    HISTORY:  A 65 year old white female with paroxysms of dyspnea dating back  months to years which occur both at rest and activity and occasionally  while sleeping.  I had asked her to try albuterol  to see if she noticed any  improvement during the spells, and also to notice whether she lost her voice  during spells.   She now returns today having undergone a CPST to see if she had any evidence  of exercise-induced asthma.  This study was essentially normal.  She had  also been on a trial of Nexium which she says did nothing for her, but has  not yet used the albuterol that I recommended.   PHYSICAL EXAMINATION:  On physical examination, she is a somewhat depressed  appearing ambulatory woman in no acute distress.  Stable vital signs.  HEENT:  Unremarkable.  LUNGS:  Clear perfectly clear bilaterally to auscultation and percussion.  HEART:  Regular rate and rhythm, without murmurs, rubs or gallops.  ABDOMEN:  Soft, benign,  EXTREMITIES:  Warm without clubbing, cyanosis or edema.   PFTs were normal before and after exercise, as was the exercise study for  which she reached 83% of her predicted work load and O2 uptake.   IMPRESSION:  No evidence of exercise dysfunction despite the patient's  complaints of dyspnea at rest.  No evidence of exercise-induced asthma.  This does not mean we have ruled out asthma completely and I would like the  patient to at least try albuterol to see to what extent it improves her.   I also spent extra time in emphasizing to the patient that if she has normal  exercise tolerance she could take advantage of this by exercising where she  is short of breath but not out of breath, 30 minutes daily, to reduce the  likelihood of deconditioning.   Overall, I think the pattern is more consistent with panic disorder however,  and I do not really have anything further from a pulmonary perspective to  offer here.  Since the Nexium had no impact on her symptoms, I have asked  her to stop it and followup with Dr. Catha Gosselin.    ______________________________  Charlaine Dalton. Sherene Sires, MD, [facility]    MBW/MedQ  DD: 03/30/2006  DT: 03/30/2006  Job #: [id number]   cc:   Caryn Bee L. Little, M.D.

## 2010-10-18 NOTE — Consult Note (Signed)
NAMEJEARLINE, [name]                 ACCOUNT NO.:  [id number]   MEDICAL RECORD NO.:  [id number]          PATIENT TYPE:  EMS   LOCATION:  ED                           [facility]ACILITY[facility]  [facility]   PHYSICIAN:  John C. Madilyn Fireman, M.D.    DATE OF BIRTH:  1946-05-16   DATE OF CONSULTATION:  10/09/2005  DATE OF DISCHARGE:                                   CONSULTATION   REASON FOR CONSULTATION:  GI bleeding.   HISTORY OF PRESENT ILLNESS:  The patient is a very nice 65 year old white  female whom we were consulted to see for GI bleeding.  She awoke this a.m.  shortly after rising, while standing felt weak, dizzy, dyspneic, and  diaphoretic, and had her husband check her blood pressure with the reading  of 76/53.  This continued to recur intermittently, but she went on to work,  saw 1 client, continued to have the same feelings with diaphoresis and  called her cardiologist, Dr. Garnette Scheuermann, who told her to come over and have  her pacemaker checked.  This checked out fine, but the patient continued to  feel poorly and was told to go to Dr. Fredirick Maudlin office.  On the way there,  she stopped with the need to have a bowel movement and had a very large,  bloody stool.  She called Dr. Fredirick Maudlin office, who told her to go to the  emergency room and called Dr. Kinnie Scales.  She did that but found he was out of  town, and she came to the emergency room and was admitted by Dr. Julio Sicks.  The patient has had some abdominal cramps between the onset of her systemic  symptoms and her bleeding, and these are mainly in the epigastric area.  She  states that she has had 3 colonoscopies and 3 EGDs by Dr. Kinnie Scales, each of  which were 2-1/2 years ago.  She does not know that she has ever had any  findings on any colonoscopy including diverticulosis or polyps but states  she had ulcer which she was shown pictures of on her last EKG 2-1/2 years  ago.  She takes Nexium 40 mg daily but also takes an aspirin a day and has  been on Advil for  bursitis for the last 2-1/2 months.  She describes the  bleeding as dark red to black.   PAST MEDICAL HISTORY:  1.  Hypertension.  2.  Hyperlipidemia.  3.  Cardiac arrhythmias.  4.  Recent bronchitis.   PAST SURGICAL HISTORY:  1.  Breast lumpectomy.  2.  Hiatal hernia repair.  3.  Hysterectomy.  4.  Nissen fundoplication.  5.  Pacemaker insertion.   SOCIAL HISTORY:  The patient denies alcohol or tobacco use.  She is married.  She has 2 daughters.   ALLERGIES:  PENICILLIN and SULFA.   MEDICATIONS:  Aspirin, Lipitor, Tricor, Maxzide, Nexium, Synthroid, MiraLAX.   LABORATORY DATA:  WBC 11,500, hemoglobin 10.1.  Other lab not available at  this time.   PHYSICAL EXAMINATION:  GENERAL:  Well-developed, well-nourished white female  in no acute distress.  VITAL SIGNS:  Blood pressure currently 90/60 with pulse of 90 lying flat  after receiving significant IV fluids.  HEART:  Regular rate and rhythm without murmur.  LUNGS:  Clear.  ABDOMEN:  Soft, nondistended with hyperactive bowel sounds.  There is mild  epigastric tenderness.  No hepatosplenomegaly, mass, or guarding.  RECTAL EXAM:  Not done.  The patient observed to pass a fairly large amount  of maroon colored, melenic-smelling stool in the bed pan.   IMPRESSION:  Acute gastrointestinal bleeding with orthostatic hypotension,  overall picture suggestive of fairly significant upper gastrointestinal  bleed.   PLAN:  Urgent endoscopy tonight as well as fluid and blood product  resuscitation as needed.           ______________________________  Everardo All Madilyn Fireman, M.D.     JCH/MEDQ  D:  10/09/2005  T:  10/09/2005  Job:  [id number]   cc:   Caryn Bee L. Little, M.D.  Fax: [phone]   Griffith Citron, M.D.  Fax: [phone]   Lyn Records, M.D.  Fax: [phone]

## 2010-10-18 NOTE — H&P (Signed)
[place]  Patient:    [name], [name]                        MRN: [id number] Adm. Date:  [id number] Attending:  Lyn Records. Iii CC:         Kevin L. Little, M.D.  Celso Sickle, M.D.   History and Physical  CHIEF COMPLAINT:  Chest pain.  HISTORY OF PRESENT ILLNESS:  This is a 65 year old married Caucasian woman, admitted with left chest pain which radiated to the left arm and left shoulder, and somewhat to the left scapular area.  The pain began on January 08, 2000, during sedentary activity, but was mild at that time.  She had very little discomfort on August 9, and January 10, 2000, but today at around 7 p.m. while folding clothes, she developed left chest discomfort, associated with dyspnea.  The pain is a dull pain.  It is not worse with inspiration or movement.  There is no association with food intake.  The patient felt nauseated but did not vomit at the onset of this pain.  She did not become diaphoretic.  She took an aspirin at home, with no relief, and then came to the emergency room where her pain has persisted, despite being given morphine 4 mg twice.  She is also on IV nitroglycerin.  She does not have any prior history of known coronary artery disease, but does have risk factors of hypercholesterolemia and hypertension.  She is not diabetic.  She has had some increased exertional dyspnea over the past several months, but no exertional chest pain.  CURRENT MEDICATIONS: 1. Lipitor 40 mg q.d. 2. Atenolol once q.d. 3. Hydrochlorothiazide 25/triamterene 37.5, one q.d. 4. Premarin 0.625 mg q.d. 5. Aspirin one q.d.  FAMILY HISTORY:  Mother is living at age 77, but has some type of heart trouble.  Father is living at age 100, but has had multiple strokes.  She has a sister who has had carotid artery surgery at age 26.  There is no history of any definite premature coronary artery disease.  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  She has never smoked.  She drinks occasional wine.  She is married and has two children living well.  ALLERGIES:  PENICILLIN.  PAST MEDICAL/SURGICAL HISTORY: 1. She the repair of a hiatal hernia eight years ago by Dr. Milus Mallick. 2. She had inguinal hernia surgery last year by Dr. Jimmye Norman. 3. Five years ago she was noted to have marked bradycardia, secondary    to sick sinus syndrome, and had a dual-chamber pacer inserted by    Dr. Francisca December. 4. She has had a vaginal hysterectomy in 1983. 5. She has had a breast biopsy for benign disease. 6. About five years ago she had a normal treadmill by Dr. Darci Needle.  REVIEW OF SYSTEMS:  GASTROINTESTINAL:  No history of heartburn or dysphagia. RESPIRATORY:  No cough or sputum production but she has had exertional dyspnea.  GENITOURINARY:  Reveals no bladder symptoms.  ENDOCRINE:  Reveals heat tolerance.  She has not had any significant weight loss.  CARDIOVASCULAR: No history of traditional exertional angina.  NEUROLOGIC:  A past history of migraines, followed by Dr. Clabe Seal. [place].  PHYSICAL EXAMINATION:  VITAL SIGNS:  Blood pressure 112/51, pulse 64, respirations normal.  HEENT/NECK:  Jugular venous pressure normal.  Carotids normal, no bruits.  LUNGS:  Clear.  HEART:  Reveals a quiet precordium without murmur, gallop, rub, or click. There is no chest wall tenderness.  The patient pacer site looks okay.  ABDOMEN:  Soft, nontender.  EXTREMITIES:  Show good peripheral pulses.  No phlebitis or edema.  Electrocardiogram shows that she has P-wave pacing with normal QRS.  She has no ischemic ST-T wave changes.  Chest x-ray shows no active disease.  Cardiac enzymes are negative x 1.  DIAGNOSTIC IMPRESSION: 1. Chest pain, rule out myocardial infarction. 2. Hypertensive cardiovascular disease. 3. History of hypercholesterolemia. 4.  Postmenopausal, status post hysterectomy.  DISPOSITION:  Admit to a monitored bed.  Continue with IV nitroglycerin, IV heparin, beta blockers, and aspirin.  Will also use IV morphine.  Serial enzymes will be obtained.  Will also check thyroid function studies, in view of heat intolerance.  If we can get her on the schedule on Sunday morning, we will do an adenosine Cardiolite tomorrow. DD:  01/11/00 TD:  01/12/00 Job: [id number]

## 2010-10-18 NOTE — Op Note (Signed)
[place]  Patient:    [name], [name]                        MRN: [id number] Proc. Date: 07/26/99 Adm. Date:  [id number] Attending:  Cherylynn Ridges                           Operative Report  PREOPERATIVE DIAGNOSIS:  Right inguinal hernia, likely direct.  POSTOPERATIVE DIAGNOSIS:  Right direct inguinal hernia.  OPERATION PERFORMED:  Repair of right inguinal direct hernia with Marlex mesh.  SURGEON:  Jimmye Norman, M.D.  ASSISTANT:  None.  ANESTHESIA:  General with laryngeal airway.  ESTIMATED BLOOD LOSS:  Less than 30 cc.  COMPLICATIONS:  None.  CONDITION:  Stable.  INDICATIONS FOR PROCEDURE:  The patient is a 65 year old woman with sick sinus syndrome and pacemaker in place who comes in with a right inguinal hernia.  OPERATIVE FINDINGS:  The patient had a direct hernia with no evidence of any cord hernia, no indirect sac.  She had mesh repair.  DESCRIPTION OF PROCEDURE:  The patient was taken to the operating room  and placed on the table in supine position.  After an adequate general anesthetic was administered, he was prepped and draped in the usual sterile manner exposing the right groin area.  The general anesthetic was done with a laryngeal airway.  A 15 blade was used to make a transverse curvilinear incision at the level of the superficial ring.  It was taken down to the external oblique fascia which is opened through its superficial ring exposing the round ligament.  This was mobilized at the pubic tubercle and encased with a Penrose drain.  We did not resect the round ligament.  We retracted it out of place, found there to be a large direct hernia. We imbricated the hernia on itself using interrupted 0 Ethibond sutures.  We then subsequently an oval piece of Marlex mesh measuring approximately 3 x 6 cm in size attaching it to the pubic tubercle and then attaching it to itself to the internal ring.  We sutured it in  place with #1 Prolene.  Once this was done, we irrigated with antibiotic solution.  We soaked it in antibiotic solution prior to implanting it in the repair.  Once this was done, we reapproximated the external oblique fascia around the round ligament using a running 3-0 Vicryl.  The Scarpas fascia was reapproximated using interrupted sutures of 3-9 Vicryl.  Then the skin was closed using a running subcuticular stitch of 4-0 Prolene.  The wound was injected with 0.25% Marcaine with epinephrine. A sterile dressing was applied. DD:  07/26/99 TD:  07/27/99 Job: [id number]

## 2010-10-18 NOTE — Discharge Summary (Signed)
[place]. [facility]  Patient:    [name], [name]                        MRN: [id number] Adm. Date:  [id number] Disc. Date: 01/15/00 Attending:  Thyra Breed Dictator:   Anselm Lis, N.P. CC:         Anna Genre. Little, M.D.  Catherine A. Orlin Hilding, M.D.  Hewitt Shorts, M.D.   Discharge Summary  PRIMARY CARE PHYSICIAN:  Dr. Catha Gosselin.  CONSULTANTS: 1. Catherine A. Orlin Hilding, M.D. (neurology). 2. Hewitt Shorts, M.D. (neurosurgery).  PROCEDURES: 1. On January 14, 2000, cervical spine film with flexion and extension,    cervical myelogram, CAT scan C9-spine with contrast; essentially no    significant abnormality.  Degenerative changes demonstrated at C6-7.    Cervical myelogram showed small ventral defects at C5-6 and C6-7.  There    was no nerve root cutoff seen on the AP and oblique projections.  There was    small central protrusion at C5-6 with uncovertebral spurring at the C6-7    level.  No foraminal encroachment noted. 2. On January 13, 2000, abdominal ultrasound revealing normal gallbladder and    biliary tree.  Excessive epigastric bowel gas obscured visualization of    the pancreas. 3. On January 14, 2000, CT of the head with and without contrast media revealed    diffuse contrast throughout the subarachnoid spaces secondary to prior    cervical myelogram and CT of the cervical spine earlier.  No acute process    noted. 4. On January 16, 2000, three view left shoulder film:  Negative. 5. On January 12, 2000, Adenosine Cardiolite myocardial perfusion scan which    was negative for evidence of infarction nor ischemia.  Ejection fraction of    62%. 6. Chest x-ray on January 11, 2000, mild cardiomegaly; no active disease.    Cardiac pacer present with stable positioning of leads.  DISCHARGE DIAGNOSES/HOSPITAL COURSE:  #1 - [name] is a 65 year old female.  History significant for dyslipidemia, sick sinus syndrome with subsequent  pacemaker five years earlier (Dr. Amil Amen), who presented with a three day history of left shoulder and chest discomfort which, on the evening of admission, was associated with dyspnea and nausea.  She was admitted via [facility] to telemetry, and she had IV nitrates and heparin.  She did rule out by serial cardiac enzymes.  Her chest and arm discomfort continued during course of admission.  On January 12, 2000, she underwent myocardial perfusion scan which was negative for evidence of ischemia nor infarct.  By the second hospital day she no longer had chest discomfort, but complained of severe left arm and shoulder discomfort with stiffness on right side of her neck intermittently. She felt her left arm was somewhat weak.  Dr. Newell Coral of neurosurgery was consulted; his further diagnostic studies included a cervical myelogram/CT, C-spine films for evaluation of probable left cervical radiculopathy. Those studies were essentially normal.  Dr. Orlin Hilding of neurology was consulted; head CT was essentially normal, but increased dye from recent myelogram made interpretation somewhat difficult.  She recommended discharge on nonopioid analgesics, and Neurontin with follow up EMG in approximately 1 to 2 weeks.  She also recommended shoulder films which were obtained prior to discharge and were negative for significant abnormality.  She is discharged home with some continued discomfort with plans for follow up with Dr. Orlin Hilding and with primary care as  soon as possible.  #2 - HYPOTHYROIDISM:  TSH this admission was elevated at 15.683, with reference range of 0.350 to 5.100.  T4 was within normal range at 10.4 (reference range 4.5 to 10.9), and normal T3 of 147.2 (reference range of 60 to 181).  She was initiated on a small dose of Synthroid this admission (50 mcg), but was not discharged on this medication; further evaluation and followup and decision to treat pending primary care  physicians review.  #3 - HISTORY OF DYSLIPIDEMIA:  On Lipitor.  #4 - HISTORY OF HYPERTENSION:  Good control on current medical regimen.  PLAN:  The patient is discharged home in fair condition.  DISCHARGE MEDICATIONS: 1. Lipitor 40 mg p.o. q.d. 2. Atenolol 100 mg p.o. q.d. 3. Hydrochlorothiazide/triamterene 25/37.5 p.o. q.d. 4. Premarin 0.625 mg p.o. q.d. 5. Baby aspirin once daily. 6. (New) Neurontin 300 mg one p.o. q.h.s. 7. (New) Darvocet-N 100 one or two tablets q.6h. p.r.n. pain.  ACTIVITY:  As tolerated.  DIET:  Low fat, low cholesterol recommended.  WOUND CARE:  May shower.  FOLLOWUP: 1. Dr. Catha Gosselin (primary care) as soon as possible. 2. Dr. Orlin Hilding or Dr. Noreene Filbert in 7 to 14 days.  LABORATORY TESTS AND DATA:  CBC revealed hemoglobin 8, hematocrit of 36.8, platelets of 372.  Admission coags within normal range.  Sodium 140, potassium 3.8, chloride 109, glucose 101, BUN 20, creatinine 1.1.  LFTs within normal range, though albumin low normal 3.1 to 3.4.  Serial cardiac enzymes and troponin-I were negative x 3 sets.  Lipid profile:  Cholesterol of 208, triglycerides 297, HDL 44, LDL of 105.  Thyroid function tests as detailed above.  Urine culture was negative for nitrite, negative for glucose, negative for ketones, nor protein.  Chest x-ray revealed no active disease.  EKG revealed T-wave paced; normal QRS.  No ischemic changes.  PAST MEDICAL HISTORY: 1. Hiatal hernia surgery 8 years early by Dr. Orpah Greek. 2. Inguinal hernia surgery by Dr. Lindie Spruce. 3. Pacemaker five years early for marked brady-sick sinus syndrome by Dr.    Amil Amen, normal treadmill 5 years earlier. 4. Vaginal hysterectomy in 1993. 5. Breast biopsy. 6. Dyslipidemia. 7. Hypertension. DD:  02/24/00 TD:  02/25/00 Job: [id number]

## 2010-10-18 NOTE — Discharge Summary (Signed)
[name], [name]                 ACCOUNT NO.:  [id number]   MEDICAL RECORD NO.:  [id number]          PATIENT TYPE:  INP   LOCATION:  3310                         FACILITY:  [facility]   PHYSICIAN:  Kela Millin, M.D.DATE OF BIRTH:  [birth year]   DATE OF ADMISSION:  10/09/2005  DATE OF DISCHARGE:  10/12/2005                                 DISCHARGE SUMMARY   DISCHARGE DIAGNOSES:  1. Gastrointestinal bleeding - arteriovenous malformation versus non-      steroidal antiinflammatory drug induced ulcer.  2. Anemia, blood loss - secondary to #1.  3. History of hypertension.  4. History of hyperlipidemia.   PROCEDURE:  1. EGD - gastric erosions, old flecks of coffeegrounds noted.  2. Colonoscopy - no bleeding seen. Internal hemorrhoids.   CONSULTATIONS:  Gastroenterology.   HISTORY:  The patient is a 65 year old white female with a history of  hypertension, dyslipidemia, sick sinus syndrome - status post pacemaker who  presented with complaints of bloody stools. The patient reported that she  had been in her usual state of health until the a.m. of admission when she  noted bloody dark clots in her stools. She called her primary care physician  and was asked to come to the ER. She admitted to generalized weakness and  diaphoresis. She denied chest pain, shortness of breath and she admitted to  feeling dizzy. She denied dysuria, urinary frequency, cough. She admitted to  some epigastric pain but denied nausea and vomiting. She denied fevers and  chills, also no recent weight gain or loss. The patient was seen in the ER  and admission to the [facility] hospitalist service was requested for further GI  evaluation.   PHYSICAL EXAMINATION ON ADMISSION:  Per Dr. Julio [name].  VITAL SIGNS:  Blood pressure of 107/68, temperature 98.8, pulse of 74,  respiratory rate of 20.  HEENT:  Mild scleral pallor, anicteric.  ABDOMEN:  Mild epigastric tenderness, no guarding and no rebound tenderness.  The  rest of the physical examination was reported to be within normal  limits.   White cell count 11.6, hemoglobin 10.4, hematocrit 30.5, platelet count 455.  Sodium 137, potassium 3.7, chloride 104, CO2 37, glucose 163, BUN 49.  Creatinine 0.8, calcium 8.5.   HOSPITAL COURSE:  GI bleed - AVM versus NSAID induced ulcer per GI. Upon  admission, the patient's H&H was monitored. The patient's H&H dropped to 7.5  and 21.5 and she was transfused a total of 3 units of packed red blood  cells. Gastroenterology was consulted and saw the patient. An EGD as well as  colonoscopy were both done, the results as stated above. A small gastric  erosion was noted. A nuclear medicine RBC scan was also done while the  patient was in the hospital but the source of the bleeding was not seen on  the bleeding scan. Following these studies and the transfusion, the patient  was monitored, her H&H remained stable - her last H&H prior to discharge on  May 13 was 10.3 and 29.4. The patient was placed on a PPI  upon admission  and GI  recommended that she continues to take the PPI upon discharge and  recommended that she be discharged home and followup with [facility] at which  time a capsule endoscopy would be considered as appropriate. The patient was  instructed to discontinue aspirin until followup with Dr. Madilyn [name] and she was  also instructed to avoid all NSAIDs.   DISCHARGE MEDICATIONS:  Nexium p.o. daily and patient to continue the rest  of her preadmission medications.   FOLLOWUP:  1. Dr. Madilyn [name] as scheduled.  2. Dr. Clarene [name] in one week.   CONDITION ON DISCHARGE:  Improved/stable.      Kela Millin, M.D.  Electronically Signed     ACV/MEDQ  D:  12/18/2005  T:  12/18/2005  Job:  [id number]   cc:   Dr. [name]  [name], M.D.

## 2010-10-18 NOTE — Op Note (Signed)
[name], [name]                 ACCOUNT NO.:  [id number]   MEDICAL RECORD NO.:  [id number]          PATIENT TYPE:  OIB   LOCATION:  2852                         FACILITY:  [facility]   PHYSICIAN:  Francisca December, M.D.  DATE OF BIRTH:  May 22, 1946   DATE OF PROCEDURE:  07/09/2006  DATE OF DISCHARGE:  07/09/2006                               OPERATIVE REPORT   PROCEDURE PERFORMED:  1. Explant old pacing generator.  2. Insert new dual-chamber pacemaker.  3. Atrial and ventricular lead threshold testing.   INDICATION:  A 65 year old woman with sick sinus syndrome has a  Medtronic device implanted 1997 now at elective replacement interval.   PROCEDURE NOTE:  The patient is brought to cardiac catheterization  laboratory in fasting state.  The left prepectoral region was prepped  and draped in the usual sterile fashion.  Local anesthesia was obtained  with infiltration 1% lidocaine.  A 6 to 7 cm incision was then made over  the old pacemaker insertion site.  This was carried down by sharp and  blunt dissection to the pacemaker capsule.  The capsule was incised and  the pacemaker delivered without difficulty.  Atrial ventricular leads  were tested for adequate pacing parameters and this was reported below.  The pocket was then irrigated with 1% kanamycin solution.  The new  pacing generator was attached to the pacing wires carefully identifying  each by its serial number and placing each into the appropriate  receptacle.  Each lead was tightened into place and tested for security.  The pacing generator was then placed within the old pacemaker capsule.  The wound was then closed using 2-0 Vicryl running fashion in the  subcutaneous tissue.  Two layers applied.  The skin was approximated  using 4-0 Vicryl running subcuticular fashion.  Steri-Strips and sterile  dressing were applied.  The patient is transported to the recovery area  in stable condition.   EQUIPMENT DATA:  The new pacing  generator is a Medtronic Adapta model  ADDR L1, serial number [id number].   PACING DATA:  The atrial lead detected a 4.7 mV P-wave.  The pacing  threshold was 1.1 volts at 0.5 milliseconds pulse width.  The impedance  was 542 ohms resulting in a current at capture threshold of 2.1 MA.  The  ventricular lead detected a 10.8 mV R wave.  The pacing threshold was  0.9 volts at 0.5 milliseconds pulse width.  The impedance was 747 ohms  resulting in a current at capture threshold 1.4 MA.      Francisca December, M.D.  Electronically Signed     JHE/MEDQ  D:  07/09/2006  T:  07/10/2006  Job:  [id number]

## 2010-10-18 NOTE — Assessment & Plan Note (Signed)
[facility]                               PULMONARY OFFICE NOTE   [name], [name]                        MRN:          [id number]  DATE:02/06/2006                            DOB:          11/14/1945    PULMONARY CONSULTATION   CHIEF COMPLAINT:  Dyspnea.   HISTORY:  This is a 65 year old white female, never smoker, with many  months of paroxysms of dyspnea that typically occur at rest, but also  partially are reproducible with exercise.  They occasionally also occur at  night, but most of the symptoms occur paroxysmally during the day and last  for several minutes.  During the spells, she says she can barely speak,  but I was never clear whether she meant she was hoarse during the spells.  She denies any exertional chest pain, orthopnea, PND, leg swelling, overt  itching, sneezing, or subjective wheezing during the spells.   PAST MEDICAL HISTORY:  1. Hypertension (note she is not on any ACE inhibitors).  2. A diagnosis of asthma (note that she has never tried Albuterol during      one of her spells).  3. Chronic allergies and sinus problems that are seasonal in nature.  4. Status post hysterectomy.  5. Status post pacemaker eight years ago.   ALLERGIES:  PENICILLIN.   SOCIAL HISTORY:  She has never smoked.  She denies any unusual travel, pet,  or hobby exposure.   MEDICATIONS:  1. Nexium 40 mg one daily.  2. Lipitor 80 mg one daily.  3. Tricor 145 one daily.  4. Synthroid 100 mcg per day.  5. Maxzide-25 one daily.  6. Pulmicort, Flovent, Rhinocort, and Nasonex p.r.n.   FAMILY HISTORY:  Positive for asthma in her mother.   REVIEW OF SYSTEMS:  Taken from the detailed worksheet and significant for  occasional bad heartburn, otherwise negative except as outlined above.   PHYSICAL EXAMINATION:  GENERAL:  This is an obese female, who has gained  about 5 to 10 pounds over the time she has developed her complaint of  paroxysms of  dyspnea.  VITAL SIGNS:  Stable vital signs.  HEENT:  Unremarkable.  Pharynx clear.  She was slightly hoarse with mild  voice fatigue, however, nasal turbinates show normal air canals.  NECK:  Supple without cervical adenopathy or tenderness.  Trachea was  midline.  LUNGS:  The lung fields were perfectly clear bilaterally to auscultation and  percussion.  HEART:  Regular rhythm without murmur, gallop, or rub.  No increase in PT.  ABDOMEN:  Obese.  EXTREMITIES:  No calf tenderness, cyanosis, clubbing, or edema.   Hemoglobin saturation 98% on room air.   IMPRESSION:  Paroxysms of dyspnea are consistent with panic disorder, but a  differential diagnosis includes poorly controlled gastroesophageal reflux  disease (especially if she actually loses her phonation during a spell).  The other issue is that she might have a component of asthma and has yet to  try Albuterol during one of her spells (again, this is the fact that the  spells rarely occur at night and  resolve on their own without treatment with  Albuterol).   The fact that the spells are partially reproducible with exertion would  indicate to me that we might be able to identify an organic cause for the  symptom, and therefore I recommended trying to reproduce this in a lab on a  treadmill, and I have scheduled this, therefore, for a CPST as soon as  possible.   In the meantime, I am emphasized the importance of taking her Nexium  perfectly regularly at 30 minutes to an hour before meals, and also  observing dietary recommendations reviewed with her in writing.   The next time she has a spell, I would like her to see if Albuterol will  eliminate it any quicker than it resolves on its own, and also whether or  not a lack of phonation is part of her spell as part of the symptom  complex and education.                                   [name]. Sherene Sires, MD, [facility]   MBW/MedQ  DD:  02/06/2006  DT:  02/07/2006  Job #:  [id number]

## 2010-10-18 NOTE — Op Note (Signed)
[facility]  Patient:    [name], [name]                        MRN: [id number] Proc. Date: 09/27/99 Adm. 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  PROCEDURE:  Exploration and resection of ilioinguinal and iliohypogastric nerves with a release of fibrous tissue.  SURGEON:  Jimmye Norman, M.D.  ASSISTANT:  None.  ANESTHESIA:  General with a laryngeal airway.  ESTIMATED BLOOD LOSS:  Less than 30 cc.  COMPLICATIONS:  None.  CONDITION:  Stable.  SPECIMEN:  Nerve tissue from the ilioinguinal and iliohypogastric area.  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  The tenderness was over the mons pubis and down paralleling the ilioinguinal nerve, also extending onto the anteromedial aspect of the thigh.  OPERATION:  The patient was taken to the operating room, placed on table in the supine position.  After an adequate general laryngeal airway anesthetic was administered, she was prepped and draped in usual sterile manner exposing the right groin area, where her previous inguinal repair had been performed.  An incision was made through the previous incision down into the subcutaneous tissue.  We used electrocautery to dissect down to the external oblique fascia, which opened up into the superficial ring towards the mons pubis.  It could be seen at the level of the superficial ring, a large amount of scar tissue, which was tethering a nerve, which appeared to exit from  the external canal, go down towards the area of distribution that the patient had noted to be the site of her discomfort.  This represented likely the ilioinguinal nerve.  We resected it at the tissue level of the subcu going down towards the thigh and also at the superficial ring.  We subsequently opened up the superficial ring and the inguinal canal in order to expose the round ligament again.  There was a second nerve that came out superomedially, which appeared to be either the iliohypogastric or the genitofemoral nerve, which was resected also and one became out more inferolateral to the round ligament, which was taken, which may have been iliohypogastric.  These were both sent as specimens as nerve tissue generally.  As opposed to closing the external oblique fascia after resecting this, we left that open, but did close Scarpas fascia using 3-0 Vicryl suture and then the skin using a running subcuticular stitch of 5-0 Vicryl.  Marcaine 0.25% with epinephrine was injected into the subcu and then sterile dressing was applied along with Steri-Strips. DD:  09/27/99 TD:  09/28/99 Job: [id number]

## 2011-03-14 LAB — DIFFERENTIAL
Basophils Relative: 0
Eosinophils Absolute: 0.1
Eosinophils Relative: 3
Lymphs Abs: 1.5
Monocytes Absolute: 0.4
Neutro Abs: 2.9
Neutrophils Relative %: 58

## 2011-03-14 LAB — PROTIME-INR: Prothrombin Time: 12.8

## 2011-03-14 LAB — CBC
Hemoglobin: 13.2
MCHC: 34.6
RBC: 4.37
RDW: 12.9

## 2011-03-14 LAB — CARDIAC PANEL(CRET KIN+CKTOT+MB+TROPI)
CK, MB: 1.3
CK, MB: 1.4
Relative Index: INVALID
Total CK: 38
Total CK: 42
Troponin I: 0.02
Troponin I: 0.02

## 2011-03-14 LAB — POCT CARDIAC MARKERS
Myoglobin, poc: 41.5
Operator id: 4531
Troponin i, poc: <0.05

## 2011-03-14 LAB — BASIC METABOLIC PANEL
BUN: 17
Creatinine, Ser: 0.75
GFR calc Af Amer: >60
Glucose, Bld: 102 — ABNORMAL HIGH
Potassium: 3.8

## 2011-03-14 LAB — D-DIMER, QUANTITATIVE: D-Dimer, Quant: 0.27

## 2013-03-30 ENCOUNTER — Encounter (HOSPITAL_BASED_OUTPATIENT_CLINIC_OR_DEPARTMENT_OTHER): Payer: Self-pay | Admitting: Emergency Medicine

## 2013-03-30 ENCOUNTER — Emergency Department (HOSPITAL_BASED_OUTPATIENT_CLINIC_OR_DEPARTMENT_OTHER): Payer: No Typology Code available for payment source

## 2013-03-30 ENCOUNTER — Emergency Department (HOSPITAL_BASED_OUTPATIENT_CLINIC_OR_DEPARTMENT_OTHER)
Admission: EM | Admit: 2013-03-30 | Discharge: 2013-03-30 | Disposition: A | Discharge status: Home / Self Care | Payer: No Typology Code available for payment source | Attending: Emergency Medicine | Admitting: Emergency Medicine

## 2013-03-30 DIAGNOSIS — S39012A Strain of muscle, fascia and tendon of lower back, initial encounter: Secondary | ICD-10-CM

## 2013-03-30 DIAGNOSIS — Y92410 Unspecified street and highway as the place of occurrence of the external cause: Secondary | ICD-10-CM | POA: Insufficient documentation

## 2013-03-30 DIAGNOSIS — Z880 Allergy status to penicillin: Secondary | ICD-10-CM | POA: Insufficient documentation

## 2013-03-30 DIAGNOSIS — S239XXA Sprain of unspecified parts of thorax, initial encounter: Secondary | ICD-10-CM | POA: Insufficient documentation

## 2013-03-30 DIAGNOSIS — S161XXA Strain of muscle, fascia and tendon at neck level, initial encounter: Secondary | ICD-10-CM

## 2013-03-30 DIAGNOSIS — S139XXA Sprain of joints and ligaments of unspecified parts of neck, initial encounter: Secondary | ICD-10-CM | POA: Insufficient documentation

## 2013-03-30 DIAGNOSIS — E079 Disorder of thyroid, unspecified: Secondary | ICD-10-CM | POA: Insufficient documentation

## 2013-03-30 DIAGNOSIS — I10 Essential (primary) hypertension: Secondary | ICD-10-CM | POA: Insufficient documentation

## 2013-03-30 DIAGNOSIS — Z79899 Other long term (current) drug therapy: Secondary | ICD-10-CM | POA: Insufficient documentation

## 2013-03-30 DIAGNOSIS — Y9389 Activity, other specified: Secondary | ICD-10-CM | POA: Insufficient documentation

## 2013-03-30 HISTORY — DX: Disorder of thyroid, unspecified: E07.9

## 2013-03-30 HISTORY — DX: Essential (primary) hypertension: I10

## 2013-03-30 MED ORDER — HYDROCODONE-ACETAMINOPHEN 5-325 MG PO TABS: 2.0000 | ORAL_TABLET | ORAL | Status: DC | PRN
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
## 2013-04-13 ENCOUNTER — Encounter: Payer: Self-pay | Admitting: Interventional Cardiology

## 2013-05-30 ENCOUNTER — Other Ambulatory Visit: Payer: Self-pay | Admitting: Dermatology

## 2013-05-30 DIAGNOSIS — C4492 Squamous cell carcinoma of skin, unspecified: Secondary | ICD-10-CM

## 2013-05-30 HISTORY — DX: Squamous cell carcinoma of skin, unspecified: C44.92

## 2013-06-24 ENCOUNTER — Encounter: Payer: Self-pay | Admitting: *Deleted

## 2013-06-29 ENCOUNTER — Encounter: Payer: Self-pay | Admitting: Internal Medicine

## 2013-07-20 ENCOUNTER — Ambulatory Visit: Payer: Self-pay | Admitting: Interventional Cardiology

## 2013-08-30 ENCOUNTER — Encounter: Payer: Self-pay | Admitting: Internal Medicine

## 2013-09-06 ENCOUNTER — Ambulatory Visit (INDEPENDENT_AMBULATORY_CARE_PROVIDER_SITE_OTHER): Payer: No Typology Code available for payment source | Admitting: *Deleted

## 2013-09-06 ENCOUNTER — Ambulatory Visit (INDEPENDENT_AMBULATORY_CARE_PROVIDER_SITE_OTHER): Payer: No Typology Code available for payment source | Admitting: Interventional Cardiology

## 2013-09-06 ENCOUNTER — Encounter: Payer: Self-pay | Admitting: Interventional Cardiology

## 2013-09-06 ENCOUNTER — Encounter: Payer: Self-pay | Admitting: Internal Medicine

## 2013-09-06 VITALS — BP 134/83 | HR 78 | Ht 62.0 in | Wt 153.8 lb

## 2013-09-06 DIAGNOSIS — I10 Essential (primary) hypertension: Secondary | ICD-10-CM | POA: Insufficient documentation

## 2013-09-06 DIAGNOSIS — Z950 Presence of cardiac pacemaker: Secondary | ICD-10-CM

## 2013-09-06 DIAGNOSIS — E785 Hyperlipidemia, unspecified: Secondary | ICD-10-CM

## 2013-09-06 DIAGNOSIS — M25512 Pain in left shoulder: Secondary | ICD-10-CM

## 2013-09-06 DIAGNOSIS — I495 Sick sinus syndrome: Secondary | ICD-10-CM

## 2013-09-06 DIAGNOSIS — M25519 Pain in unspecified shoulder: Secondary | ICD-10-CM

## 2013-09-06 LAB — MDC_IDC_ENUM_SESS_TYPE_INCLINIC
Battery Impedance: 1014 Ohm
Battery Voltage: 2.75 V
Brady Statistic AP VP Percent: 0 %
Brady Statistic AP VS Percent: 89 %
Date Time Interrogation Session: 20150407150530
Lead Channel Impedance Value: 360 Ohm
Lead Channel Impedance Value: 772 Ohm
Lead Channel Pacing Threshold Pulse Width: 0.4 ms
Lead Channel Pacing Threshold Pulse Width: 0.4 ms
Lead Channel Sensing Intrinsic Amplitude: 2 mV
Lead Channel Setting Pacing Amplitude: 2 V
Lead Channel Setting Pacing Amplitude: 2.75 V
Lead Channel Setting Sensing Sensitivity: 4 mV
MDC IDC MSMT BATTERY REMAINING LONGEVITY: 38 mo
MDC IDC MSMT LEADCHNL RA PACING THRESHOLD AMPLITUDE: 1.25 V
MDC IDC MSMT LEADCHNL RV PACING THRESHOLD AMPLITUDE: 0.75 V
MDC IDC MSMT LEADCHNL RV SENSING INTR AMPL: 8 mV
MDC IDC SET LEADCHNL RV PACING PULSEWIDTH: 0.4 ms
MDC IDC STAT BRADY AS VP PERCENT: 0 %
MDC IDC STAT BRADY AS VS PERCENT: 11 %

## 2013-09-06 NOTE — Progress Notes (Signed)
Pacemaker check in clinic. Normal device function. Thresholds, sensing, impedances consistent with previous measurements. Device programmed to maximize longevity. No mode switches 17 HVRs---longest w/ EGM 9 beats; numerous EGMS show SVT. Device programmed at appropriate safety margins. Histogram distribution appropriate for patient activity level. Device programmed to optimize intrinsic conduction. Estimated longevity 3 yrs. Enrolled in [employer]. ROV w/ Dr. Caryl Comes in 26mo.

## 2013-09-06 NOTE — Patient Instructions (Signed)
Your physician recommends that you continue on your current medications as directed. Please refer to the Current Medication list given to you today.  Your physician wants you to follow-up in: 1 year. You will receive a reminder letter in the mail two months in advance. If you don't receive a letter, please call our office to schedule the follow-up appointment.  

## 2013-09-06 NOTE — Progress Notes (Signed)
Patient ID: [name], female   DOB: [birth year], 68 y.o.   MRN: [id number]    [address], [address], [place]  [address] Phone: [phone] Fax:  [phone]  Date:  09/06/2013   ID:  [name], DOB [birth year], MRN [id number]  PCP:  Gennette Pac, MD   ASSESSMENT:  1. Sinus node dysfunction 2. DDD pacemaker 3. Hypertension 4. Left shoulder/clavicle discomfort, positional  PLAN:  1. One-year clinical followup 2. Continue active lifestyle   SUBJECTIVE: [name] is a 68 y.o. female who has no cardiopulmonary complaints. There is some soreness in the left clavicle and shoulder area particularly if she lies on the left side. And arm in certain positions also accentuates the discomfort. She has not had palpitations or syncope. She denies lower extremity swelling.   Wt Readings from Last 3 Encounters:  09/06/13 153 lb 12.8 oz (69.763 kg)  03/30/13 153 lb (69.4 kg)     Past Medical History  Diagnosis Date  . Hypertension   . Thyroid disease   . Hyperlipidemia   . Allergic rhinitis   . Hypothyroidism   . GERD (gastroesophageal reflux disease)   . GI bleed   . History of basal cell cancer   . SSS (sick sinus syndrome)   . Peptic ulcer disease   . Lumbar disc disease     Current Outpatient Prescriptions  Medication Sig Dispense Refill  . albuterol (PROVENTIL HFA;VENTOLIN HFA) 108 (90 BASE) MCG/ACT inhaler Inhale into the lungs every 6 (six) hours as needed for wheezing or shortness of breath.      Marland Kitchen albuterol (PROVENTIL) (2.5 MG/3ML) 0.083% nebulizer solution Take 2.5 mg by nebulization every 6 (six) hours as needed for wheezing or shortness of breath.      Marland Kitchen atorvastatin (LIPITOR) 80 MG tablet Take 80 mg by mouth daily.      Marland Kitchen azelastine (ASTELIN) 137 MCG/SPRAY nasal spray Place 2 sprays into both nostrils 2 (two) times daily. Use in each nostril as directed      . DIHYDROERGOTAMINE MESYLATE NA Inject 2 mLs as directed as needed.       Marland Kitchen  esomeprazole (NEXIUM) 40 MG capsule Take 40 mg by mouth 2 (two) times daily before a meal.      . fenofibrate 160 MG tablet Take 80 mg by mouth daily.      . Fish Oil OIL Take 2,000 mg by mouth daily.       . hydrochlorothiazide (HYDRODIURIL) 25 MG tablet Take 25 mg by mouth daily.      Marland Kitchen HYDROcodone-acetaminophen (NORCO/VICODIN) 5-325 MG per tablet Take 2 tablets by mouth every 4 (four) hours as needed for pain.  15 tablet  0  . levothyroxine (SYNTHROID, LEVOTHROID) 88 MCG tablet Take 88 mcg by mouth daily before breakfast.      . mometasone (NASONEX) 50 MCG/ACT nasal spray Place 2 sprays into the nose daily.      . promethazine (PHENERGAN) 25 MG tablet Take 25 mg by mouth every 6 (six) hours as needed for nausea or vomiting.      . Vitamin D, Cholecalciferol, 1000 UNITS TABS Take 1 tablet by mouth daily.        No current facility-administered medications for this visit.    Allergies:    Allergies  Allergen Reactions  . Nsaids     Upper GI bleed  . Penicillins   . Sulfa Antibiotics     itching  . Wasp Venom  Social History:  The patient  reports that she has never smoked. She does not have any smokeless tobacco history on file. She reports that she does not drink alcohol or use illicit drugs.   ROS:  Please see the history of present illness.   She is purposefully lost weight. Exertional tolerance is improved. She has left clavicular discomfort.  The discomfort is made worse by moving the left arm All other systems reviewed and negative.   OBJECTIVE: VS:  BP 134/83  Pulse 78  Ht 5\' 2"  (1.575 m)  Wt 153 lb 12.8 oz (69.763 kg)  BMI 28.12 kg/m2 Well nourished, well developed, in no acute distress, consistent with her stated age. HEENT: normal Neck: JVD flat. Carotid bruit absent  Cardiac:  normal S1, S2; RRR; no murmur Lungs:  clear to auscultation bilaterally, no wheezing, rhonchi or rales Abd: soft, nontender, no hepatomegaly Ext: Edema absent. Pulses 2+ Skin: warm and  dry Neuro:  CNs 2-12 intact, no focal abnormalities noted  EKG:  Normal sinus rhythm/atrial pacing with nonspecific T-wave abnormality.       Signed, Illene Labrador III, MD 09/06/2013 1:13 PM

## 2013-10-24 ENCOUNTER — Emergency Department (HOSPITAL_BASED_OUTPATIENT_CLINIC_OR_DEPARTMENT_OTHER)
Admission: EM | Admit: 2013-10-24 | Discharge: 2013-10-24 | Disposition: A | Discharge status: Home / Self Care | Payer: Medicare Other | Attending: Emergency Medicine | Admitting: Emergency Medicine

## 2013-10-24 ENCOUNTER — Encounter (HOSPITAL_BASED_OUTPATIENT_CLINIC_OR_DEPARTMENT_OTHER): Payer: Self-pay | Admitting: Emergency Medicine

## 2013-10-24 DIAGNOSIS — Z23 Encounter for immunization: Secondary | ICD-10-CM | POA: Insufficient documentation

## 2013-10-24 DIAGNOSIS — Y929 Unspecified place or not applicable: Secondary | ICD-10-CM | POA: Insufficient documentation

## 2013-10-24 DIAGNOSIS — IMO0002 Reserved for concepts with insufficient information to code with codable children: Secondary | ICD-10-CM | POA: Insufficient documentation

## 2013-10-24 DIAGNOSIS — E039 Hypothyroidism, unspecified: Secondary | ICD-10-CM | POA: Insufficient documentation

## 2013-10-24 DIAGNOSIS — T23259A Burn of second degree of unspecified palm, initial encounter: Secondary | ICD-10-CM | POA: Insufficient documentation

## 2013-10-24 DIAGNOSIS — X19XXXA Contact with other heat and hot substances, initial encounter: Secondary | ICD-10-CM | POA: Insufficient documentation

## 2013-10-24 DIAGNOSIS — Z880 Allergy status to penicillin: Secondary | ICD-10-CM | POA: Insufficient documentation

## 2013-10-24 DIAGNOSIS — Z8709 Personal history of other diseases of the respiratory system: Secondary | ICD-10-CM | POA: Insufficient documentation

## 2013-10-24 DIAGNOSIS — K219 Gastro-esophageal reflux disease without esophagitis: Secondary | ICD-10-CM | POA: Insufficient documentation

## 2013-10-24 DIAGNOSIS — Z85828 Personal history of other malignant neoplasm of skin: Secondary | ICD-10-CM | POA: Insufficient documentation

## 2013-10-24 DIAGNOSIS — Z950 Presence of cardiac pacemaker: Secondary | ICD-10-CM | POA: Insufficient documentation

## 2013-10-24 DIAGNOSIS — T23001A Burn of unspecified degree of right hand, unspecified site, initial encounter: Secondary | ICD-10-CM

## 2013-10-24 DIAGNOSIS — Z8739 Personal history of other diseases of the musculoskeletal system and connective tissue: Secondary | ICD-10-CM | POA: Insufficient documentation

## 2013-10-24 DIAGNOSIS — Z79899 Other long term (current) drug therapy: Secondary | ICD-10-CM | POA: Insufficient documentation

## 2013-10-24 DIAGNOSIS — E785 Hyperlipidemia, unspecified: Secondary | ICD-10-CM | POA: Insufficient documentation

## 2013-10-24 DIAGNOSIS — I10 Essential (primary) hypertension: Secondary | ICD-10-CM | POA: Insufficient documentation

## 2013-10-24 DIAGNOSIS — Z8711 Personal history of peptic ulcer disease: Secondary | ICD-10-CM | POA: Insufficient documentation

## 2013-10-24 DIAGNOSIS — Y9389 Activity, other specified: Secondary | ICD-10-CM | POA: Insufficient documentation

## 2013-10-24 MED ORDER — HYDROMORPHONE HCL PF 1 MG/ML IJ SOLN
1.0000 mg | Freq: Once | INTRAMUSCULAR | Status: AC
  Administered 2013-10-24: 1 mg via INTRAMUSCULAR
  Filled 2013-10-24: qty 1

## 2013-10-24 MED ORDER — OXYCODONE-ACETAMINOPHEN 5-325 MG PO TABS: 1.0000 | ORAL_TABLET | ORAL | Status: DC | PRN

## 2013-10-24 MED ORDER — OXYCODONE-ACETAMINOPHEN 5-325 MG PO TABS
2.0000 | ORAL_TABLET | Freq: Once | ORAL | Status: AC
  Administered 2013-10-24: 2 via ORAL
  Filled 2013-10-24: qty 2

## 2013-10-24 MED ORDER — TETANUS-DIPHTH-ACELL PERTUSSIS 5-2.5-18.5 LF-MCG/0.5 IM SUSP
0.5000 mL | Freq: Once | INTRAMUSCULAR | Status: AC
  Administered 2013-10-24: 0.5 mL via INTRAMUSCULAR
  Filled 2013-10-24: qty 0.5
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
## 2013-12-09 ENCOUNTER — Encounter: Payer: Self-pay | Admitting: Cardiology

## 2013-12-09 ENCOUNTER — Telehealth: Payer: Self-pay | Admitting: Cardiology

## 2013-12-09 NOTE — Telephone Encounter (Signed)
Called and informed pt that due to upgrades to home monitoring we need her to send manual transmission from [place]. I informed pt that I would send her instructions in the mail on how to do this. Pt verbalized understanding.

## 2013-12-16 ENCOUNTER — Encounter: Payer: Self-pay | Admitting: Cardiology

## 2014-05-19 ENCOUNTER — Encounter: Payer: Self-pay | Admitting: Internal Medicine

## 2014-05-19 ENCOUNTER — Ambulatory Visit (INDEPENDENT_AMBULATORY_CARE_PROVIDER_SITE_OTHER): Payer: Medicare Other | Admitting: *Deleted

## 2014-05-19 DIAGNOSIS — I495 Sick sinus syndrome: Secondary | ICD-10-CM

## 2014-05-19 DIAGNOSIS — Z950 Presence of cardiac pacemaker: Secondary | ICD-10-CM

## 2014-05-19 LAB — MDC_IDC_ENUM_SESS_TYPE_INCLINIC
Battery Remaining Longevity: 29 mo
Brady Statistic AP VP Percent: 0 %
Brady Statistic AP VS Percent: 89 %
Brady Statistic AS VP Percent: 0 %
Brady Statistic AS VS Percent: 11 %
Date Time Interrogation Session: 20151218113141
Lead Channel Impedance Value: 737 Ohm
Lead Channel Pacing Threshold Amplitude: 0.75 V
Lead Channel Pacing Threshold Amplitude: 1.25 V
Lead Channel Pacing Threshold Pulse Width: 0.4 ms
Lead Channel Pacing Threshold Pulse Width: 0.4 ms
Lead Channel Sensing Intrinsic Amplitude: 2 mV
Lead Channel Sensing Intrinsic Amplitude: 8 mV
Lead Channel Setting Pacing Amplitude: 2 V
Lead Channel Setting Pacing Pulse Width: 0.4 ms
MDC IDC MSMT BATTERY IMPEDANCE: 1417 Ohm
MDC IDC MSMT BATTERY VOLTAGE: 2.75 V
MDC IDC MSMT LEADCHNL RA IMPEDANCE VALUE: 356 Ohm
MDC IDC SET LEADCHNL RA PACING AMPLITUDE: 2.5 V
MDC IDC SET LEADCHNL RV SENSING SENSITIVITY: 2.8 mV

## 2014-05-19 NOTE — Progress Notes (Signed)
Pacemaker check in clinic. Normal device function. Thresholds, sensing, impedances consistent with previous measurements. Device programmed to maximize longevity. 2 mode switches--- <0.1%. 9 high ventricular rates noted---longest 18 beats. Device programmed at appropriate safety margins---changed RA output from 2.75 to 2.5V. Histogram distribution appropriate for patient activity level. Device programmed to optimize intrinsic conduction. Estimated longevity 2.56yrs. ROV w/ Dr. Caryl Comes 11/21/14.  Pt's smart phone software not compatible with [employer].

## 2014-09-22 ENCOUNTER — Other Ambulatory Visit: Payer: Self-pay | Admitting: Family Medicine

## 2014-09-22 DIAGNOSIS — N289 Disorder of kidney and ureter, unspecified: Secondary | ICD-10-CM

## 2014-09-25 ENCOUNTER — Ambulatory Visit
Admission: RE | Admit: 2014-09-25 | Discharge: 2014-09-25 | Disposition: A | Discharge status: Home / Self Care | Payer: Medicare Other | Source: Ambulatory Visit | Attending: Family Medicine | Admitting: Family Medicine

## 2014-09-25 ENCOUNTER — Other Ambulatory Visit: Payer: Medicare Other

## 2014-09-25 DIAGNOSIS — N289 Disorder of kidney and ureter, unspecified: Secondary | ICD-10-CM

## 2014-10-29 ENCOUNTER — Emergency Department (HOSPITAL_BASED_OUTPATIENT_CLINIC_OR_DEPARTMENT_OTHER)
Admission: EM | Admit: 2014-10-29 | Discharge: 2014-10-29 | Disposition: A | Discharge status: Home / Self Care | Payer: Medicare Other | Attending: Emergency Medicine | Admitting: Emergency Medicine

## 2014-10-29 ENCOUNTER — Emergency Department (HOSPITAL_BASED_OUTPATIENT_CLINIC_OR_DEPARTMENT_OTHER): Payer: Medicare Other

## 2014-10-29 ENCOUNTER — Encounter (HOSPITAL_BASED_OUTPATIENT_CLINIC_OR_DEPARTMENT_OTHER): Payer: Self-pay

## 2014-10-29 DIAGNOSIS — M545 Low back pain: Secondary | ICD-10-CM | POA: Diagnosis present

## 2014-10-29 DIAGNOSIS — Z8711 Personal history of peptic ulcer disease: Secondary | ICD-10-CM | POA: Diagnosis not present

## 2014-10-29 DIAGNOSIS — E785 Hyperlipidemia, unspecified: Secondary | ICD-10-CM | POA: Insufficient documentation

## 2014-10-29 DIAGNOSIS — Z8709 Personal history of other diseases of the respiratory system: Secondary | ICD-10-CM | POA: Insufficient documentation

## 2014-10-29 DIAGNOSIS — R52 Pain, unspecified: Secondary | ICD-10-CM

## 2014-10-29 DIAGNOSIS — Z79899 Other long term (current) drug therapy: Secondary | ICD-10-CM | POA: Insufficient documentation

## 2014-10-29 DIAGNOSIS — Z880 Allergy status to penicillin: Secondary | ICD-10-CM | POA: Insufficient documentation

## 2014-10-29 DIAGNOSIS — I10 Essential (primary) hypertension: Secondary | ICD-10-CM | POA: Insufficient documentation

## 2014-10-29 DIAGNOSIS — M5416 Radiculopathy, lumbar region: Secondary | ICD-10-CM | POA: Diagnosis not present

## 2014-10-29 DIAGNOSIS — Z85828 Personal history of other malignant neoplasm of skin: Secondary | ICD-10-CM | POA: Diagnosis not present

## 2014-10-29 DIAGNOSIS — E039 Hypothyroidism, unspecified: Secondary | ICD-10-CM | POA: Insufficient documentation

## 2014-10-29 DIAGNOSIS — K219 Gastro-esophageal reflux disease without esophagitis: Secondary | ICD-10-CM | POA: Insufficient documentation

## 2014-10-29 MED ORDER — DIAZEPAM 5 MG PO TABS
5.0000 mg | ORAL_TABLET | Freq: Once | ORAL | Status: AC
  Administered 2014-10-29: 5 mg via ORAL
  Filled 2014-10-29: qty 1

## 2014-10-29 MED ORDER — OXYCODONE-ACETAMINOPHEN 5-325 MG PO TABS
1.0000 | ORAL_TABLET | Freq: Once | ORAL | Status: AC
  Administered 2014-10-29: 1 via ORAL
  Filled 2014-10-29: qty 1

## 2014-10-29 MED ORDER — OXYCODONE-ACETAMINOPHEN 5-325 MG PO TABS: 1.0000 | ORAL_TABLET | Freq: Four times a day (QID) | ORAL | Status: DC | PRN

## 2014-10-29 MED ORDER — DIAZEPAM 5 MG PO TABS: 5.0000 mg | ORAL_TABLET | Freq: Two times a day (BID) | ORAL | Status: DC | PRN

## 2014-10-29 MED ORDER — LIDOCAINE 5 % EX PTCH: 1.0000 | MEDICATED_PATCH | CUTANEOUS | Status: DC

## 2014-10-29 NOTE — ED Notes (Signed)
Patient here with lower back pain x 1 day, reports that it started suddenly while sitting at table. Patient having great difficulty with any change in position and radiation down right leg. Used ice and tylenol with no relief

## 2014-10-29 NOTE — ED Provider Notes (Signed)
CSN: [id number]     Arrival date & time 10/29/14  0915 History   First MD Initiated Contact with Patient 10/29/14 0935     Chief Complaint  Patient presents with  . Back Pain     (Consider location/radiation/quality/duration/timing/severity/associated sxs/prior Treatment) Patient is a 69 y.o. female presenting with back pain. The history is provided by the patient.  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 Associated symptoms: leg pain   Associated symptoms: no abdominal pain, no bladder incontinence, no bowel incontinence, no dysuria, no fever, no paresthesias and no weakness   Risk factors: menopause   Risk factors: no hx of osteoporosis and no steroid use     Past Medical History  Diagnosis Date  . Hypertension   . Thyroid disease   . Hyperlipidemia   . Allergic rhinitis   . Hypothyroidism   . GERD (gastroesophageal reflux disease)   . GI bleed   . History of basal cell cancer   . SSS (sick sinus syndrome)   . Peptic ulcer disease   . Lumbar disc disease    Past Surgical History  Procedure Laterality Date  . Abdominal hysterectomy    . Breast surgery    . Hernia repair    . Pacemaker insertion     Family History  Problem Relation Age of Onset  . Hypertension Father    History  Substance Use Topics  . Smoking status: Never Smoker   . Smokeless tobacco: Not on file  . Alcohol Use: No   OB History    No data available     Review of Systems  Constitutional: Negative for fever.  Gastrointestinal: Negative for  abdominal pain and bowel incontinence.  Genitourinary: Negative for bladder incontinence and dysuria.  Musculoskeletal: Positive for back pain.  Neurological: Negative for weakness and paresthesias.  All other systems reviewed and are negative.     Allergies  Nsaids; Penicillins; Sulfa antibiotics; and Wasp venom  Home Medications   Prior to Admission medications   Medication Sig Start Date End Date Taking? Authorizing Provider  albuterol (PROVENTIL HFA;VENTOLIN HFA) 108 (90 BASE) MCG/ACT inhaler Inhale into the lungs every 6 (six) hours as needed for wheezing or shortness of breath.    Historical Provider, MD  albuterol (PROVENTIL) (2.5 MG/3ML) 0.083% nebulizer solution Take 2.5 mg by nebulization every 6 (six) hours as needed for wheezing or shortness of breath.    Historical Provider, MD  atorvastatin (LIPITOR) 80 MG tablet Take 80 mg by mouth daily.    Historical Provider, MD  azelastine (ASTELIN) 137 MCG/SPRAY nasal spray Place 2 sprays into both nostrils 2 (two) times daily. Use in each nostril as directed    Historical Provider, MD  diazepam (VALIUM) 5 MG tablet Take 1 tablet (5 mg total) by mouth every 12 (twelve) hours as needed for muscle spasms. 10/29/14   Blanchie Dessert, MD  DIHYDROERGOTAMINE MESYLATE NA Inject 2 mLs as directed as needed.     Historical Provider, MD  esomeprazole (NEXIUM) 40 MG capsule Take 40 mg by  mouth 2 (two) times daily before a meal.    Historical Provider, MD  fenofibrate 160 MG tablet Take 80 mg by mouth daily.    Historical Provider, MD  Fish Oil OIL Take 2,000 mg by mouth daily.     Historical Provider, MD  hydrochlorothiazide (HYDRODIURIL) 25 MG tablet Take 25 mg by mouth daily.    Historical Provider, MD  levothyroxine (SYNTHROID, LEVOTHROID) 88 MCG tablet Take 88 mcg by mouth daily before breakfast.    Historical Provider, MD  lidocaine (LIDODERM) 5 % Place 1 patch onto the skin daily. Remove & Discard patch within 12 hours or as directed by MD  10/29/14   Blanchie Dessert, MD  mometasone (NASONEX) 50 MCG/ACT nasal spray Place 2 sprays into the nose as needed.     Historical Provider, MD  oxyCODONE-acetaminophen (PERCOCET/ROXICET) 5-325 MG per tablet Take 1-2 tablets by mouth every 6 (six) hours as needed for severe pain. 10/29/14   Blanchie Dessert, MD  promethazine (PHENERGAN) 25 MG tablet Take 25 mg by mouth every 6 (six) hours as needed for nausea or vomiting.    Historical Provider, MD  Vitamin D, Cholecalciferol, 1000 UNITS TABS Take 1 tablet by mouth daily.     Historical Provider, MD   BP 103/60 mmHg  Pulse 62  Temp(Src) 97.5 F (36.4 C) (Oral)  Resp 18  Ht 5\' 2"  (1.575 m)  Wt 153 lb (69.4 kg)  BMI 27.98 kg/m2  SpO2 96% Physical Exam  Constitutional: She is oriented to person, place, and time. She appears well-developed and well-nourished. She appears distressed.  HENT:  Head: Normocephalic and atraumatic.  Mouth/Throat: Oropharynx is clear and moist.  Eyes: EOM are normal. Pupils are equal, round, and reactive to light.  Neck: Normal range of motion. Neck supple. No spinous process tenderness and no muscular tenderness present. No rigidity. Normal range of motion present.  Cardiovascular: Normal rate, regular rhythm, normal heart sounds and intact distal pulses.   No murmur heard. Pulmonary/Chest: Effort normal and breath sounds normal. She has no wheezes. She has no rales.  Abdominal: Soft. She exhibits no distension. There is no tenderness. There is no CVA tenderness.  Musculoskeletal: She exhibits no tenderness.       Lumbar back: She exhibits decreased range of motion, bony tenderness, pain and spasm. She exhibits no swelling, no deformity and normal pulse.       Back:  Neurological: She is alert and oriented to person, place, and time. Coordination and gait normal.  Reflex Scores:      Patellar reflexes are 1+ on the right side and 1+ on the left side. Skin: Skin is warm and dry. No rash noted.  Psychiatric: She  has a normal mood and affect.  Nursing note and vitals reviewed.   ED Course  Procedures (including critical care time) Labs Review Labs Reviewed - No data to display  Imaging Review Dg Lumbar Spine Complete  10/29/2014   CLINICAL DATA:  Low back pain x1 day  EXAM: LUMBAR SPINE - COMPLETE 4+ VIEW  COMPARISON:  Lumbar spine CT dated 08/23/2004  FINDINGS: Five lumbar type vertebral bodies.  Normal lumbar lordosis.  Grade 1 anterolisthesis of L4 on L5.  No evidence of fracture. Vertebral body heights and intervertebral disc spaces are maintained.  Mild multilevel degenerative changes.  Visualized bony pelvis appears intact.  Surgical clips in the left upper abdomen.  Vascular calcifications.  IMPRESSION: No fracture is seen.  Mild degenerative changes.  Grade 1 anterolisthesis of L4  on L5.   Electronically Signed   By: Julian Hy M.D.   On: 10/29/2014 10:06     EKG Interpretation None      MDM   Final diagnoses:  Pain  Lumbar radiculopathy    Pt with gradual onset of back pain suggestive of radiculopathy that started yesterday with prior hx of similar but more severe today.  No neurovascular compromise and no incontinence.  Pt has no infectious sx, hx of CA  or other red flags concerning for pathologic back pain such as IVDU.  Pt is able to ambulate but is painful.  Normal strength and reflexes on exam.  Denies trauma.  Imaging negative for compression fracture. Will give pt pain control and to return for developement of above sx.     Blanchie Dessert, MD 10/29/14 1447

## 2014-11-21 ENCOUNTER — Encounter: Payer: Medicare Other | Admitting: Internal Medicine

## 2014-11-24 ENCOUNTER — Encounter: Payer: Self-pay | Admitting: Internal Medicine

## 2014-11-24 ENCOUNTER — Ambulatory Visit (INDEPENDENT_AMBULATORY_CARE_PROVIDER_SITE_OTHER): Payer: Medicare Other | Admitting: Internal Medicine

## 2014-11-24 VITALS — BP 140/84 | HR 81 | Ht 62.0 in | Wt 152.0 lb

## 2014-11-24 DIAGNOSIS — Z950 Presence of cardiac pacemaker: Secondary | ICD-10-CM

## 2014-11-24 DIAGNOSIS — I495 Sick sinus syndrome: Secondary | ICD-10-CM | POA: Diagnosis not present

## 2014-11-24 DIAGNOSIS — R0602 Shortness of breath: Secondary | ICD-10-CM | POA: Diagnosis not present

## 2014-11-24 DIAGNOSIS — R079 Chest pain, unspecified: Secondary | ICD-10-CM | POA: Diagnosis not present

## 2014-11-24 DIAGNOSIS — Z45018 Encounter for adjustment and management of other part of cardiac pacemaker: Secondary | ICD-10-CM | POA: Diagnosis not present

## 2014-11-24 LAB — CUP PACEART INCLINIC DEVICE CHECK
Battery Remaining Longevity: 26 mo
Battery Voltage: 2.73 V
Brady Statistic AP VP Percent: 0 %
Brady Statistic AS VP Percent: 0 %
Brady Statistic AS VS Percent: 12 %
Lead Channel Pacing Threshold Amplitude: 1.25 V
Lead Channel Sensing Intrinsic Amplitude: 2 mV
Lead Channel Setting Pacing Amplitude: 2 V
Lead Channel Setting Pacing Amplitude: 2.5 V
Lead Channel Setting Pacing Pulse Width: 0.4 ms
Lead Channel Setting Sensing Sensitivity: 4 mV
MDC IDC MSMT BATTERY IMPEDANCE: 1786 Ohm
MDC IDC MSMT LEADCHNL RA IMPEDANCE VALUE: 347 Ohm
MDC IDC MSMT LEADCHNL RA PACING THRESHOLD PULSEWIDTH: 0.64 ms
MDC IDC MSMT LEADCHNL RV IMPEDANCE VALUE: 842 Ohm
MDC IDC MSMT LEADCHNL RV PACING THRESHOLD AMPLITUDE: 0.75 V
MDC IDC MSMT LEADCHNL RV PACING THRESHOLD PULSEWIDTH: 0.4 ms
MDC IDC MSMT LEADCHNL RV SENSING INTR AMPL: 11.2 mV
MDC IDC SESS DTM: 20160624140825
MDC IDC STAT BRADY AP VS PERCENT: 87 %

## 2014-11-24 NOTE — Progress Notes (Signed)
ELECTROPHYSIOLOGY CONSULT NOTE  Patient ID: [name], MRN: [id number], DOB/AGE: [birth year] 69 y.o. Admit date: (Not on file) Date of Consult: 11/24/2014  Primary Physician: Gennette Pac, MD Primary Cardiologist: [facility] Chief Complaint: Establish for pacemaker   HPI [name] is a 69 y.o. female  Seen to establish for pacemaker follow-up. The device was implanted about 15 years ago and underwent generator replacement in 2008  for sinus node dysfunction by Dr. Ilda Foil. It was a Medtronic Adapta L.  She has had problems with exertional chest discomfort which he describes as a tightness with some radiation into the left arm. It is relieved by rest. She has some dyspnea on exertion and these not infrequently: Clear. There is no peripheral edema. She has had no palpitations.  She has no family history of heart disease. She is on statins as well as FIBRATES she does not smoke  Echocardiogram 11/7 and demonstrated normal LV function  Past Medical History  Diagnosis Date  . Hypertension   . Thyroid disease   . Hyperlipidemia   . Allergic rhinitis   . Hypothyroidism   . GERD (gastroesophageal reflux disease)   . GI bleed   . History of basal cell cancer   . SSS (sick sinus syndrome)   . Peptic ulcer disease   . Lumbar disc disease       Surgical History:  Past Surgical History  Procedure Laterality Date  . Abdominal hysterectomy    . Breast surgery    . Hernia repair    . Pacemaker insertion       Home Meds: Prior to Admission medications   Medication Sig Start Date End Date Taking? Authorizing Provider  albuterol (PROVENTIL HFA;VENTOLIN HFA) 108 (90 BASE) MCG/ACT inhaler Inhale into the lungs every 6 (six) hours as needed for wheezing or shortness of breath.   Yes Historical Provider, MD  albuterol (PROVENTIL) (2.5 MG/3ML) 0.083% nebulizer solution Take 2.5 mg by nebulization every 6 (six) hours as needed for wheezing or shortness of breath.   Yes Historical  Provider, MD  atorvastatin (LIPITOR) 80 MG tablet Take 80 mg by mouth daily.   Yes Historical Provider, MD  azelastine (ASTELIN) 137 MCG/SPRAY nasal spray Place 2 sprays into both nostrils 2 (two) times daily. Use in each nostril as directed   Yes Historical Provider, MD  DIHYDROERGOTAMINE MESYLATE NA Inject 2 mLs as directed as needed.    Yes Historical Provider, MD  esomeprazole (NEXIUM) 40 MG capsule Take 40 mg by mouth 2 (two) times daily before a meal.   Yes Historical Provider, MD  fenofibrate 160 MG tablet Take 80 mg by mouth daily.   Yes Historical Provider, MD  Fish Oil OIL Take 2,000 mg by mouth daily.    Yes Historical Provider, MD  hydrochlorothiazide (HYDRODIURIL) 25 MG tablet Take 25 mg by mouth daily.   Yes Historical Provider, MD  levothyroxine (SYNTHROID, LEVOTHROID) 88 MCG tablet Take 88 mcg by mouth daily before breakfast.   Yes Historical Provider, MD  mometasone (NASONEX) 50 MCG/ACT nasal spray Place 2 sprays into the nose as needed.    Yes Historical Provider, MD  promethazine (PHENERGAN) 25 MG tablet Take 25 mg by mouth every 6 (six) hours as needed for nausea or vomiting.   Yes Historical Provider, MD  Vitamin D, Cholecalciferol, 1000 UNITS TABS Take 1 tablet by mouth daily.    Yes Historical Provider, MD      Allergies:  Allergies  Allergen Reactions  . Bee  Venom   . Nsaids     Upper GI bleed  . Penicillins   . Sulfa Antibiotics     itching  . Wasp Venom     History   Social History  . Marital Status: Married    Spouse Name: N/A  . Number of Children: N/A  . Years of Education: N/A   Occupational History  . Not on file.   Social History Main Topics  . Smoking status: Never Smoker   . Smokeless tobacco: Not on file  . Alcohol Use: No  . Drug Use: No  . Sexual Activity: No   Other Topics Concern  . Not on file   Social History Narrative     Family History  Problem Relation Age of Onset  . Hypertension Father      ROS:  Please see the  history of present illness.     All other systems reviewed and negative.    Physical Exam: Blood pressure 140/84, pulse 81, height 5\' 2"  (1.575 m), weight 152 lb (68.947 kg). General: Well developed, well nourished female in no acute distress. Head: Normocephalic, atraumatic, sclera non-icteric, no xanthomas, nares are without discharge. EENT: normal Lymph Nodes:  none Back: without scoliosis/kyphosis , no CVA tendersness Neck: Negative for carotid bruits. JVD not elevated. Lungs: Clear bilaterally to auscultation without wheezes, rales, or rhonchi. Breathing is unlabored. Heart: RRR with S1 S2.  2/6 systolic  murmur , rubs, or gallops appreciated. Abdomen: Soft, non-tender, non-distended with normoactive bowel sounds. No hepatomegaly. No rebound/guarding. No obvious abdominal masses. Msk:  Strength and tone appear normal for age. Extremities: No clubbing or cyanosis. No   edema.  Distal pedal pulses are 2+ and equal bilaterally. Skin: Warm and Dry Neuro: Alert and oriented X 3. CN III-XII intact Grossly normal sensory and motor function . Psych:  Responds to questions appropriately with a normal affect.      Labs: Cardiac Enzymes No results for input(s): CKTOTAL, CKMB, TROPONINI in the last 72 hours. CBC Lab Results  Component Value Date   WBC 5.0 01/18/2007   HGB 13.2 01/18/2007   HCT 38.2 01/18/2007   MCV 87.4 01/18/2007   PLT 326 01/18/2007   PROTIME: No results for input(s): LABPROT, INR in the last 72 hours. Chemistry No results for input(s): NA, K, CL, CO2, BUN, CREATININE, CALCIUM, PROT, BILITOT, ALKPHOS, ALT, AST, GLUCOSE in the last 168 hours.  Invalid input(s): LABALBU Lipids No results found for: CHOL, HDL, LDLCALC, TRIG BNP No results found for: PROBNP Thyroid Function Tests: No results for input(s): TSH, T4TOTAL, T3FREE, THYROIDAB in the last 72 hours.  Invalid input(s): FREET3    Miscellaneous Lab Results  Component Value Date   DDIMER  01/18/2007     0.27        AT THE INHOUSE ESTABLISHED CUTOFF VALUE OF 0.48 ug/mL FEU, THIS ASSAY HAS BEEN DOCUMENTED IN THE LITERATURE TO HAVE    Radiology/Studies:  Dg Lumbar Spine Complete  10/29/2014   CLINICAL DATA:  Low back pain x1 day  EXAM: LUMBAR SPINE - COMPLETE 4+ VIEW  COMPARISON:  Lumbar spine CT dated 08/23/2004  FINDINGS: Five lumbar type vertebral bodies.  Normal lumbar lordosis.  Grade 1 anterolisthesis of L4 on L5.  No evidence of fracture. Vertebral body heights and intervertebral disc spaces are maintained.  Mild multilevel degenerative changes.  Visualized bony pelvis appears intact.  Surgical clips in the left upper abdomen.  Vascular calcifications.  IMPRESSION: No fracture is seen.  Mild degenerative  changes.  Grade 1 anterolisthesis of L4 on L5.   Electronically Signed   By: Julian Hy M.D.   On: 10/29/2014 10:06    EKG: Atrial pacing with intrinsic conduction and left bundle branch block   Assessment and Plan:  Sinus node dysfunction  Medtronic pacemaker  Left bundle branch block  Exertional chest pain  Dyspnea on exertion  The patient has a stable pacemaker. The patient developed new left bundle branch block. The concern would be is her underlying cardiac myopathy and so we will evaluate left ventricular function. In the context of worsening symptoms of shortness of breath and exertional chest discomfort, will undertake a Myoview to assess perfusion as well as ejection fraction.  Her husband has cirrhosis with esophageal varices apparently idiopathic       Virl Axe

## 2014-11-24 NOTE — Patient Instructions (Addendum)
Medication Instructions:  Your physician recommends that you continue on your current medications as directed. Please refer to the Current Medication list given to you today.  Labwork: None ordered  Testing/Procedures: Your physician has requested that you have a lexiscan myoview. For further information please visit [email or web address]. Please follow instruction sheet, as given.  Follow-Up: Remote monitoring is used to monitor your Pacemaker of ICD from home. This monitoring reduces the number of office visits required to check your device to one time per year. It allows [place] to keep an eye on the functioning of your device to ensure it is working properly. You are scheduled for a device check from home on 02/26/15. You may send your transmission at any time that day. If you have a wireless device, the transmission will be sent automatically. After your physician reviews your transmission, you will receive a postcard with your next transmission date.  Your physician wants you to follow-up in: 1 year with Dr. Caryl Comes.  You will receive a reminder letter in the mail two months in advance. If you don't receive a letter, please call our office to schedule the follow-up appointment.  Thank you for choosing [facility]!!

## 2014-12-15 ENCOUNTER — Encounter (HOSPITAL_COMMUNITY): Payer: Medicare Other

## 2014-12-29 ENCOUNTER — Encounter (HOSPITAL_COMMUNITY): Payer: Medicare Other

## 2015-01-02 ENCOUNTER — Telehealth (HOSPITAL_COMMUNITY): Payer: Self-pay

## 2015-01-02 NOTE — Telephone Encounter (Signed)
Left message on voicemail in reference to upcoming appointment scheduled for 01-05-2015. Phone number given for a call back so details instructions can be given. [name], [name]

## 2015-01-03 ENCOUNTER — Telehealth: Payer: Self-pay | Admitting: Internal Medicine

## 2015-01-03 ENCOUNTER — Telehealth (HOSPITAL_COMMUNITY): Payer: Self-pay | Admitting: *Deleted

## 2015-01-03 NOTE — Telephone Encounter (Signed)
Spoke w/pt and gave her CPT [phone] and explained her test is outpt hospital service.  She will contact her insurance to see what her portion will be.  Also explained she can pick up a financial aid application to see if she qualifies for any [facility] financial assistance.

## 2015-01-03 NOTE — Telephone Encounter (Signed)
Left message on voicemail in reference to upcoming appointment scheduled for 01/05/15. Phone number given for a call back so details instructions can be given. Gissella Niblack, Ranae Palms

## 2015-01-03 NOTE — Telephone Encounter (Signed)
Patient given detailed instructions per Myocardial Perfusion Study Information Sheet for test on 01/05/15 at 0815. Patient Notified to arrive 15 minutes early, and that it is imperative to arrive on time for appointment to keep from having the test rescheduled. Patient verbalized understanding. [name], Ranae Palms

## 2015-01-03 NOTE — Telephone Encounter (Signed)
Pt called [name] in our nuclear depart because she had a message from [place] to call her insurance company.  She has never had to do that before.  I didn't leave original message but I called pt and left a message w/my name and direct# for pt to call back to try to answer any questions she may have.

## 2015-01-05 ENCOUNTER — Ambulatory Visit (HOSPITAL_COMMUNITY): Payer: Medicare Other | Attending: Cardiovascular Disease

## 2015-01-05 DIAGNOSIS — R9439 Abnormal result of other cardiovascular function study: Secondary | ICD-10-CM | POA: Diagnosis not present

## 2015-01-05 DIAGNOSIS — R079 Chest pain, unspecified: Secondary | ICD-10-CM | POA: Diagnosis not present

## 2015-01-05 DIAGNOSIS — R0602 Shortness of breath: Secondary | ICD-10-CM

## 2015-01-05 LAB — MYOCARDIAL PERFUSION IMAGING
CHL CUP NUCLEAR SRS: 5
CHL CUP NUCLEAR SSS: 6
LV dias vol: 79 mL
LVSYSVOL: 33 mL
Peak HR: 64 {beats}/min
RATE: 0.29
Rest HR: 69 {beats}/min
SDS: 1
TID: 0.95

## 2015-01-05 MED ORDER — TECHNETIUM TC 99M SESTAMIBI GENERIC - CARDIOLITE
30.0000 | Freq: Once | INTRAVENOUS | Status: AC | PRN
  Administered 2015-01-05: 30 via INTRAVENOUS

## 2015-01-05 MED ORDER — REGADENOSON 0.4 MG/5ML IV SOLN
0.4000 mg | Freq: Once | INTRAVENOUS | Status: AC
Start: 2015-01-05 — End: 2015-01-05
  Administered 2015-01-05: 0.4 mg via INTRAVENOUS

## 2015-01-05 MED ORDER — TECHNETIUM TC 99M SESTAMIBI GENERIC - CARDIOLITE
10.4000 | Freq: Once | INTRAVENOUS | Status: AC | PRN
  Administered 2015-01-05: 10 via INTRAVENOUS

## 2015-01-08 ENCOUNTER — Encounter: Payer: Self-pay | Admitting: Cardiology

## 2015-02-26 ENCOUNTER — Encounter: Payer: Medicare Other | Admitting: *Deleted

## 2015-02-26 ENCOUNTER — Telehealth: Payer: Self-pay | Admitting: Cardiology

## 2015-02-26 NOTE — Telephone Encounter (Signed)
Spoke with pt and reminded pt of remote transmission that is due today. Pt verbalized understanding.   

## 2015-02-27 ENCOUNTER — Encounter: Payer: Self-pay | Admitting: Cardiology

## 2015-03-27 ENCOUNTER — Ambulatory Visit (INDEPENDENT_AMBULATORY_CARE_PROVIDER_SITE_OTHER): Payer: Medicare Other | Admitting: *Deleted

## 2015-03-27 DIAGNOSIS — I495 Sick sinus syndrome: Secondary | ICD-10-CM

## 2015-03-28 NOTE — Progress Notes (Signed)
Remote pacemaker transmission.   

## 2015-04-03 LAB — CUP PACEART REMOTE DEVICE CHECK
Battery Impedance: 2121 Ohm
Brady Statistic AP VP Percent: 0 %
Brady Statistic AP VS Percent: 90 %
Brady Statistic AS VP Percent: 0 %
Brady Statistic AS VS Percent: 10 %
Date Time Interrogation Session: 20161025173913
Implantable Lead Implant Date: 19970926
Implantable Lead Location: 753860
Implantable Lead Model: 4524
Lead Channel Impedance Value: 368 Ohm
Lead Channel Impedance Value: 926 Ohm
Lead Channel Pacing Threshold Amplitude: 0.875 V
Lead Channel Pacing Threshold Amplitude: 1.375 V
Lead Channel Pacing Threshold Pulse Width: 0.4 ms
Lead Channel Sensing Intrinsic Amplitude: 8 mV
Lead Channel Setting Pacing Amplitude: 2.75 V
MDC IDC LEAD IMPLANT DT: 19970926
MDC IDC LEAD LOCATION: 753859
MDC IDC MSMT BATTERY REMAINING LONGEVITY: 20 mo
MDC IDC MSMT BATTERY VOLTAGE: 2.72 V
MDC IDC MSMT LEADCHNL RV PACING THRESHOLD PULSEWIDTH: 0.4 ms
MDC IDC SET LEADCHNL RV PACING AMPLITUDE: 2 V
MDC IDC SET LEADCHNL RV PACING PULSEWIDTH: 0.4 ms
MDC IDC SET LEADCHNL RV SENSING SENSITIVITY: 4 mV

## 2015-04-04 ENCOUNTER — Encounter: Payer: Self-pay | Admitting: Cardiology

## 2015-06-26 ENCOUNTER — Telehealth: Payer: Self-pay | Admitting: Cardiology

## 2015-06-26 ENCOUNTER — Encounter: Payer: Medicare Other | Admitting: *Deleted

## 2015-06-26 NOTE — Telephone Encounter (Signed)
LMOVM reminding pt to send remote transmission.   

## 2015-06-27 ENCOUNTER — Encounter: Payer: Self-pay | Admitting: Cardiology

## 2016-04-09 ENCOUNTER — Other Ambulatory Visit: Payer: Self-pay | Admitting: Radiology

## 2016-04-11 ENCOUNTER — Telehealth: Payer: Self-pay | Admitting: *Deleted

## 2016-04-11 ENCOUNTER — Encounter: Payer: Self-pay | Admitting: *Deleted

## 2016-04-11 DIAGNOSIS — C50412 Malignant neoplasm of upper-outer quadrant of left female breast: Secondary | ICD-10-CM | POA: Insufficient documentation

## 2016-04-11 DIAGNOSIS — Z171 Estrogen receptor negative status [ER-]: Secondary | ICD-10-CM

## 2016-04-11 HISTORY — DX: Malignant neoplasm of upper-outer quadrant of left female breast: C50.412

## 2016-04-11 NOTE — Telephone Encounter (Signed)
Confirmed [facility] for 04/16/16 at 0815 .  Instructions and contact information given.

## 2016-04-16 ENCOUNTER — Encounter: Payer: Self-pay | Admitting: Physical Therapy

## 2016-04-16 ENCOUNTER — Encounter: Payer: Self-pay | Admitting: Genetic Counselor

## 2016-04-16 ENCOUNTER — Ambulatory Visit
Admission: RE | Admit: 2016-04-16 | Discharge: 2016-04-16 | Disposition: A | Discharge status: Home / Self Care | Payer: Medicare Other | Source: Ambulatory Visit | Attending: Radiation Oncology | Admitting: Radiation Oncology

## 2016-04-16 ENCOUNTER — Ambulatory Visit (HOSPITAL_BASED_OUTPATIENT_CLINIC_OR_DEPARTMENT_OTHER): Payer: Medicare Other | Admitting: Genetic Counselor

## 2016-04-16 ENCOUNTER — Ambulatory Visit: Payer: Medicare Other

## 2016-04-16 ENCOUNTER — Encounter: Payer: Self-pay | Admitting: *Deleted

## 2016-04-16 ENCOUNTER — Ambulatory Visit: Payer: Medicare Other | Attending: Surgery | Admitting: Physical Therapy

## 2016-04-16 ENCOUNTER — Other Ambulatory Visit: Payer: Self-pay | Admitting: Surgery

## 2016-04-16 ENCOUNTER — Ambulatory Visit: Payer: Self-pay | Admitting: Oncology

## 2016-04-16 ENCOUNTER — Other Ambulatory Visit (HOSPITAL_BASED_OUTPATIENT_CLINIC_OR_DEPARTMENT_OTHER): Payer: Medicare Other

## 2016-04-16 VITALS — BP 156/87 | HR 81 | Temp 97.6°F | Resp 20 | Ht 62.0 in | Wt 158.9 lb

## 2016-04-16 DIAGNOSIS — Z171 Estrogen receptor negative status [ER-]: Secondary | ICD-10-CM | POA: Diagnosis present

## 2016-04-16 DIAGNOSIS — C50412 Malignant neoplasm of upper-outer quadrant of left female breast: Secondary | ICD-10-CM | POA: Diagnosis not present

## 2016-04-16 DIAGNOSIS — Z803 Family history of malignant neoplasm of breast: Secondary | ICD-10-CM | POA: Diagnosis not present

## 2016-04-16 DIAGNOSIS — Z170 Estrogen receptor positive status [ER+]: Principal | ICD-10-CM

## 2016-04-16 DIAGNOSIS — Z800 Family history of malignant neoplasm of digestive organs: Secondary | ICD-10-CM

## 2016-04-16 DIAGNOSIS — R293 Abnormal posture: Secondary | ICD-10-CM | POA: Diagnosis present

## 2016-04-16 DIAGNOSIS — C50912 Malignant neoplasm of unspecified site of left female breast: Secondary | ICD-10-CM

## 2016-04-16 LAB — CBC WITH DIFFERENTIAL/PLATELET
BASO%: 0.5 % (ref 0.0–2.0)
Basophils Absolute: 0 10*3/uL (ref 0.0–0.1)
EOS%: 3 % (ref 0.0–7.0)
Eosinophils Absolute: 0.2 10*3/uL (ref 0.0–0.5)
HCT: 38.7 % (ref 34.8–46.6)
HGB: 13.2 g/dL (ref 11.6–15.9)
LYMPH%: 21.5 % (ref 14.0–49.7)
MCH: 29.4 pg (ref 25.1–34.0)
MCHC: 34.1 g/dL (ref 31.5–36.0)
MCV: 86.2 fL (ref 79.5–101.0)
MONO#: 0.5 10*3/uL (ref 0.1–0.9)
MONO%: 9.5 % (ref 0.0–14.0)
NEUT#: 3.7 10*3/uL (ref 1.5–6.5)
NEUT%: 65.5 % (ref 38.4–76.8)
PLATELETS: 321 10*3/uL (ref 145–400)
RBC: 4.49 10*6/uL (ref 3.70–5.45)
RDW: 13.2 % (ref 11.2–14.5)
WBC: 5.7 10*3/uL (ref 3.9–10.3)
lymph#: 1.2 10*3/uL (ref 0.9–3.3)

## 2016-04-16 LAB — COMPREHENSIVE METABOLIC PANEL
ALT: 18 U/L (ref 0–55)
ANION GAP: 9 meq/L (ref 3–11)
AST: 20 U/L (ref 5–34)
Albumin: 3.8 g/dL (ref 3.5–5.0)
Alkaline Phosphatase: 68 U/L (ref 40–150)
BUN: 25.3 mg/dL (ref 7.0–26.0)
CALCIUM: 9.7 mg/dL (ref 8.4–10.4)
CHLORIDE: 110 meq/L — AB (ref 98–109)
CO2: 23 mEq/L (ref 22–29)
Creatinine: 0.8 mg/dL (ref 0.6–1.1)
EGFR: 71 mL/min/{1.73_m2} — ABNORMAL LOW (ref >90)
Glucose: 102 mg/dl (ref 70–140)
POTASSIUM: 3.8 meq/L (ref 3.5–5.1)
Sodium: 142 mEq/L (ref 136–145)
Total Bilirubin: 0.6 mg/dL (ref 0.20–1.20)
Total Protein: 6.9 g/dL (ref 6.4–8.3)

## 2016-04-16 NOTE — Progress Notes (Signed)
Clinical Social Work [place] Psychosocial Distress Screening [place]  Patient completed distress screening protocol and scored a 5 on the Psychosocial Distress Thermometer which indicates moderate distress. Clinical Social Worker met with patient and patients family in [facility] to assess for distress and other psychosocial needs. Patient stated she was feeling overwhelmed but felt "better" after meeting with the treatment team and getting more information on her treatment plan.  Patients main concern is finances.  She is the caregiver for her husband and primary provider for the family.  CSW and patient discussed financial resources and CSW provided patient with applications for [employer] [facility] [place] and the [employer].  Patient will collect requested documentation and return to CSW. CSW and patient also discussed common feeling and emotions when being diagnosed with cancer, and the importance of support during treatment. CSW informed patient of the support team and support services at [facility]. CSW provided contact information and encouraged patient to call with any questions or concerns.   ONCBCN DISTRESS SCREENING 04/16/2016  Screening Type Initial Screening  Distress experienced in past week (1-10) 5  Family Problem type Partner  Emotional problem type Nervousness/Anxiety  Information Concerns Type Lack of info about diagnosis;Lack of info about treatment;Lack of info about complementary therapy choices;Lack of info about maintaining fitness  Physical Problem type Changes in urination;Skin dry/itchy  Physician notified of physical symptoms Yes  Referral to clinical social work Yes  Referral to financial advocate Yes    Johnnye Lana, MSW, LCSW, OSW-C Clinical Social Worker [place] [phone]

## 2016-04-16 NOTE — Progress Notes (Signed)
Nutrition Assessment  Reason for Assessment:  Pt seen in Breast Clinic  ASSESSMENT:  70 year old female with left breast cancer.  Past medical history of HTN, HLD, PUD, GERD, pacemaker  Patient reports not eating as much as normal but still eating healthy diet. Reports stable weight.  Medications:  reviewed  Labs: reviewed  Anthropometrics:   Height: 62 Weight: 158 lb 14.4 oz BMI: 29.1   NUTRITION DIAGNOSIS: Food and nutrition related knowledge deficit related to new diagnosis of breast cancer as evidenced by no prior need for nutrition related information.  INTERVENTION:   Discussed and provided packet of information regarding nutritional tips for breast cancer patients.  Questions answered.  Teachback method used.      MONITORING, EVALUATION, and GOAL: Pt will consume a healthy plant based diet to maintain lean body mass throughout treatment.   [name] B. Zenia Resides, [place], [place] (pager)

## 2016-04-16 NOTE — Patient Instructions (Signed)

## 2016-04-16 NOTE — Progress Notes (Signed)
REFERRING PROVIDER: [name], MD [place], [place] [address]   Alexis Del, MD  PRIMARY PROVIDER:  Gennette Pac, MD  PRIMARY REASON FOR VISIT:  1. Malignant neoplasm of upper-outer quadrant of left female breast, unspecified estrogen receptor status ([place])   2. Family history of breast cancer   3. Family history of pancreatic cancer   4. Family history of colon cancer      HISTORY OF PRESENT ILLNESS:   [name], a 70 y.o. female, was seen for a [place] cancer genetics consultation at the request of Dr. Rex [name] due to a personal and family history of cancer.  [name] presents to clinic today, with her daughter [name], to discuss the possibility of a hereditary predisposition to cancer, genetic testing, and to further clarify her future cancer risks, as well as potential cancer risks for family members.   In 2017, at the age of 52, [name] was diagnosed with invasive ductal carcinoma of the left breast.  The tumor is ER-/PR-/Her2+.  This is anticipated to be treated with lumpectomy and radiation, however it will depend on her genetic test results.     CANCER HISTORY:   No history exists.     HORMONAL RISK FACTORS:  Menarche was at age 89.  First live birth at age 6.  OCP use for approximately 0 years.  Ovaries intact: yes.  Hysterectomy: no.  Menopausal status: postmenopausal.  HRT use: 2 years. Colonoscopy: yes; normal. Mammogram within the last year: yes. Number of breast biopsies: 2. Up to date with pelvic exams:  n/a. Any excessive radiation exposure in the past:  no  Past Medical History:  Diagnosis Date  . Allergic rhinitis   . Family history of breast cancer   . Family history of colon cancer   . Family history of pancreatic cancer   . GERD (gastroesophageal reflux disease)   . GI bleed   . History of basal cell cancer   . Hyperlipidemia   . Hypertension   . Hypothyroidism   . Lumbar disc disease   . Malignant neoplasm of  upper-outer quadrant of left female breast ([place]) 04/11/2016  . Peptic ulcer disease   . SSS (sick sinus syndrome) ([place])   . Thyroid disease     Past Surgical History:  Procedure Laterality Date  . ABDOMINAL HYSTERECTOMY    . BREAST SURGERY    . HERNIA REPAIR    . PACEMAKER INSERTION      Social History   Social History  . Marital status: Married    Spouse name: N/A  . Number of children: 2  . Years of education: N/A   Social History Main Topics  . Smoking status: Never Smoker  . Smokeless tobacco: Never Used  . Alcohol use No  . Drug use: No  . Sexual activity: No   Other Topics Concern  . None   Social History Narrative  . None     FAMILY HISTORY:  We obtained a detailed, 4-generation family history.  Significant diagnoses are listed below: Family History  Problem Relation Age of Onset  . Congestive Heart Failure Mother   . Hypertension Father   . Bone cancer Father   . Breast cancer Sister 20  . Bradycardia Cousin   . Breast cancer Cousin 2  . Breast cancer Cousin 17  . Colon cancer Cousin 25  . Pancreatic cancer Paternal Aunt 76  . Rectal cancer Paternal Aunt 49  . Heart attack Maternal Grandmother   .  Heart attack Paternal Grandmother   . Stroke Paternal Grandfather   . Melanoma Daughter     dx in her 4s    The patient has two daughters, one has had melanoma in her 69's.  The patient has three brothers and two sisters, one sister was diagnosed with breast cancer at 50.  The patient's mother died of CHF at 31 and her father died of bone cancer, possibly multiple myeloma, at 67.  The patient's mother had two sisters and a brother who did not have cancer.  One sister had a daughter with breast cancer at 37.  There is no other family history of cancer on this side of the family.  The patient's father had three sisters.  One sister had pancreatic cancer and this sister had a daughter with breat cancer and a son with colon cancer at 35.  Another sister had  rectal cancer 41.  Patient's maternal ancestors are of [place] descent, and paternal ancestors are of [place] descent. There is no reported Ashkenazi Jewish ancestry. There is no known consanguinity.  GENETIC COUNSELING ASSESSMENT: [name] is a 70 y.o. female with a personal and family history of breast cancer and family history of pancreatic, colon cancer and melanoma which is somewhat suggestive of a hereditary cancer syndrome and predisposition to cancer. We, therefore, discussed and recommended the following at today's visit.   DISCUSSION: We discussed that about 5-10% of breast cancer is hereditary with most cases due to BRCA mutations.  Other genes associated with hereditary breast cancer include PALB2, ATM and CHEK2.  When we see breast cancer with early onset colon cancer we can also think of Lynch syndrome.  Typically Lynch syndrome is a colon cancer syndrome, but some families have breast cancer.  We reviewed the characteristics, features and inheritance patterns of hereditary cancer syndromes. We also discussed genetic testing, including the appropriate family members to test, the process of testing, insurance coverage and turn-around-time for results. We discussed the implications of a negative, positive and/or variant of uncertain significant result. We recommended [name] pursue genetic testing for the Comprehensive cancer gene panel. The Comprehensive Cancer Panel offered by [facility] includes sequencing and/or deletion duplication testing of the following 32 genes: APC, ATM, AXIN2, BARD1, BMPR1A, BRCA1, BRCA2, BRIP1, CDH1, CDK4, CDKN2A, CHEK2, EPCAM, FANCC, MLH1, MSH2, MSH6, MUTYH, NBN, PALB2, PMS2, POLD1, POLE, PTEN, RAD51C, RAD51D, SCG5/GREM1, SMAD4, STK11, TP53, VHL, and XRCC2.     Based on [name]'s personal and family history of cancer, she meets medical criteria for genetic testing. Despite that she meets criteria, she may still have an out of pocket cost. We discussed that if her out  of pocket cost for testing is over $100, the laboratory will call and confirm whether she wants to proceed with testing.  If the out of pocket cost of testing is less than $100 she will be billed by the genetic testing laboratory.   PLAN: After considering the risks, benefits, and limitations, [name]  provided informed consent to pursue genetic testing and the blood sample was sent to [facility] for analysis of the Comprehensive cancer panel. Results were placed on a [facility], and should be available within approximately 2-3 weeks' time, at which point they will be disclosed by telephone to [name], as will any additional recommendations warranted by these results. [name] will receive a summary of her genetic counseling visit and a copy of her results once available. This information will also be available in Epic. We  encouraged [name] to remain in contact with cancer genetics annually so that we can continuously update the family history and inform her of any changes in cancer genetics and testing that may be of benefit for her family. [name] questions were answered to her satisfaction today. Our contact information was provided should additional questions or concerns arise.  Lastly, we encouraged [name] to remain in contact with cancer genetics annually so that we can continuously update the family history and inform her of any changes in cancer genetics and testing that may be of benefit for this family.   Ms.  [name] questions were answered to her satisfaction today. Our contact information was provided should additional questions or concerns arise. Thank you for the referral and allowing [place] to share in the care of your patient.   [name] P. Florene Glen, [place], [facility] Certified Genetic Counselor [email or web address] phone: [phone]  The patient was seen for a total of 45 minutes in face-to-face genetic counseling.  This patient was discussed with Drs. Magrinat, Lindi Adie and/or Burr Medico who  agrees with the above.    _______________________________________________________________________ For Office Staff:  Number of people involved in session: 2 Was an Intern/ student involved with case: no

## 2016-04-16 NOTE — Progress Notes (Signed)
Radiation Oncology         [phone] ________________________________  Initial Outpatient Consultation  Name: [name] MRN: [id number]  Date: 04/16/2016  DOB: 1945/08/27  WU:[id number],YNWGN Marigene Ehlers, MD  Alphonsa Overall, MD   REFERRING PHYSICIAN: Alphonsa Overall, MD  DIAGNOSIS:    ICD-9-CM ICD-10-CM   1. Malignant neoplasm of upper-outer quadrant of left breast in female, estrogen receptor negative ([place]) 174.4 C50.412    V86.1 Z17.1    Stage T1a N0 Left Breast UOQ Invasive Ductal Carcinoma, ER- / PR- / Her2+, Grade 1-2  CHIEF COMPLAINT: Here to discuss management of left breast cancer  HISTORY OF PRESENT ILLNESS::[name] is a 70 y.o. female who presented with a left breast mass upon screening mammography. Biopsy showed invasive ductal carcinoma with calcifications, grade 1-2. Ultrasound revealed 0.5 cm mass. Receptor status is ER (0%), PR (0%), Her2 (+), and Ki67 (10%)  Of note, patient has a pacemaker. She also has a family history of breast cancer, sister was diagnosed at age 74.   Patient is positive for fatigue, pain in the hands, hips, legs, and feet, sinus problems, heartburn, irregular heartbeat, poor circulation, nausea, psoriasis, bruise easily, back pain, joint pain, arthritis, headaches, and thyroid problems.   PREVIOUS RADIATION THERAPY: No  PAST MEDICAL HISTORY:  has a past medical history of Allergic rhinitis; Family history of breast cancer; Family history of colon cancer; Family history of pancreatic cancer; GERD (gastroesophageal reflux disease); GI bleed; History of basal cell cancer; Hyperlipidemia; Hypertension; Hypothyroidism; Lumbar disc disease; Malignant neoplasm of upper-outer quadrant of left female breast ([place]) (04/11/2016); Peptic ulcer disease; SSS (sick sinus syndrome) ([place]); and Thyroid disease.    PAST SURGICAL HISTORY: Past Surgical History:  Procedure Laterality Date  . ABDOMINAL HYSTERECTOMY    . BREAST SURGERY    . HERNIA REPAIR    .  PACEMAKER INSERTION      FAMILY HISTORY: family history includes Bone cancer in her father; Bradycardia in her cousin; Breast cancer (age of onset: 66) in her cousin and cousin; Breast cancer (age of onset: 37) in her sister; Colon cancer (age of onset: 17) in her cousin; Congestive Heart Failure in her mother; Heart attack in her maternal grandmother and paternal grandmother; Hypertension in her father; Melanoma in her daughter; Pancreatic cancer (age of onset: 42) in her paternal aunt; Rectal cancer (age of onset: 50) in her paternal aunt; Stroke in her paternal grandfather.  SOCIAL HISTORY:  reports that she has never smoked. She has never used smokeless tobacco. She reports that she does not drink alcohol or use drugs.  ALLERGIES: Nsaids; Penicillins; Bee venom; Sulfa antibiotics; and Wasp venom  MEDICATIONS:  Current Outpatient Prescriptions  Medication Sig Dispense Refill  . albuterol (PROVENTIL HFA;VENTOLIN HFA) 108 (90 BASE) MCG/ACT inhaler Inhale into the lungs every 6 (six) hours as needed for wheezing or shortness of breath.    Marland Kitchen albuterol (PROVENTIL) (2.5 MG/3ML) 0.083% nebulizer solution Take 2.5 mg by nebulization every 6 (six) hours as needed for wheezing or shortness of breath.    Marland Kitchen atorvastatin (LIPITOR) 80 MG tablet Take 80 mg by mouth daily.    Marland Kitchen azelastine (ASTELIN) 137 MCG/SPRAY nasal spray Place 2 sprays into both nostrils 2 (two) times daily. Use in each nostril as directed    . DIHYDROERGOTAMINE MESYLATE NA Inject 2 mLs as directed as needed.     Marland Kitchen esomeprazole (NEXIUM) 40 MG capsule Take 40 mg by mouth 2 (two) times daily before a meal.    .  fenofibrate 160 MG tablet Take 80 mg by mouth daily.    . Fish Oil OIL Take 2,000 mg by mouth daily.     Marland Kitchen levothyroxine (SYNTHROID, LEVOTHROID) 100 MCG tablet Take 100 mcg by mouth daily before breakfast.    . mometasone (NASONEX) 50 MCG/ACT nasal spray Place 2 sprays into the nose as needed.     . promethazine (PHENERGAN) 25 MG  tablet Take 25 mg by mouth every 6 (six) hours as needed for nausea or vomiting.    . triamterene-hydrochlorothiazide (MAXZIDE) 75-50 MG tablet Take by mouth daily.    . Vitamin D, Cholecalciferol, 1000 UNITS TABS Take 1 tablet by mouth daily.      No current facility-administered medications for this encounter.     REVIEW OF SYSTEMS:  Notable for that above. A 15 point review of systems was obtained. All pertinent positives noted in the HPI, all others are negative.   PHYSICAL EXAM:    Vitals with BMI 04/16/2016  Height _0   Weight 158 lbs 14 oz  BMI 40.8  Systolic 144  Diastolic 87  Pulse 81  Respirations 20   Lungs are clear to auscultation bilaterally. Heart has regular rate and rhythm. No palpable cervical, supraclavicular, or axillary adenopathy. Abdomen soft, non-tender, normal bowel sounds.  Right breast: no palpable mass or nipple discharge. Left breast: bruising in upper central aspect and left lower quadrant. No palpable mass within the left breast. No nipple discharge or bleeding. Faint periareolar scar present from prior benign biopsy. Pacemaker present in upper aspect of left breast .   ECOG = 0   LABORATORY DATA:  Lab Results  Component Value Date   WBC 5.7 04/16/2016   HGB 13.2 04/16/2016   HCT 38.7 04/16/2016   MCV 86.2 04/16/2016   PLT 321 04/16/2016   CMP     Component Value Date/Time   NA 142 04/16/2016 0822   K 3.8 04/16/2016 0822   CL 110 01/18/2007 0300   CO2 23 04/16/2016 0822   GLUCOSE 102 04/16/2016 0822   BUN 25.3 04/16/2016 0822   CREATININE 0.8 04/16/2016 0822   CALCIUM 9.7 04/16/2016 0822   PROT 6.9 04/16/2016 0822   ALBUMIN 3.8 04/16/2016 0822   AST 20 04/16/2016 0822   ALT 18 04/16/2016 0822   ALKPHOS 68 04/16/2016 0822   BILITOT 0.60 04/16/2016 0822   GFRNONAA >60 01/18/2007 0300   GFRAA  01/18/2007 0300    >60        The eGFR has been calculated using the MDRD equation. This calculation has not been validated in all  clinical         RADIOGRAPHY: reports and images from [facility] reviewed in conference    IMPRESSION/PLAN: ClinicalStage T1b N0 Left Breast UOQ Invasive Ductal Carcinoma, ER- / PR- / Her2+, Grade 1-2  Patient would appear to be a good candidate for breast conservation therapy with radiation therapy to left breast. Patient had an ER/PR- tumor and would not be a good candidate for adjuvant hormonal therapy and would be at risk for recurrence in the breast. So therefore, likely recommend radiotherapy in the post operative setting. Complicating her management is the pacemaker in the upper left breast which would need to be removed and placed along the right breast prior to radiation therapy. The other potential option would be to follow up the patient closely without radiation therapy as she appears to have a small  T1a lesion, but the patient is not comfortable with this approach.  Plan: Patient will be scheduled for cardiac evaluation prior to upcoming lumpectomy and sentinel lymph node procedure. Patient will also undergo genetic evaluation prior to her anticipated surgery as this may have bearing on her surgical management. Patient may be a potential candidate for chemotherapy depending on final tumor size, especially as tumor is Her2 +.       __________________________________________   Blair Promise, PhD, MD  This document serves as a record of services personally performed by Gery Pray, MD. It was created on his behalf by Bethann Humble, a trained medical scribe. The creation of this record is based on the scribe's personal observations and the provider's statements to them. This document has been checked and approved by the attending provider.

## 2016-04-16 NOTE — Progress Notes (Signed)
[name]  [phone] [phone]     ID: [id number] SACHDEV DOB: March 21, 1946  MR#: [id number]  DJM#:[id number]  Patient Care Team: [name], MD as PCP - General (Family Medicine) Chauncey Cruel, MD as Consulting Physician (Oncology) Gery Pray, MD as Consulting Physician (Radiation Oncology) Alphonsa Overall, MD as Consulting Physician (General Surgery) Chauncey Cruel, MD OTHER MD:  CHIEF COMPLAINT: HER-2 positive breast cancer  CURRENT TREATMENT: Awaiting definitive surgery   BREAST CANCER HISTORY: [name] had bilateral screening mammography at [facility] 03/28/2016. This found the breast density to be category A. In the left breast at the 12:00 position there was a new oval lesion. There were no other findings of concern. On 04/04/2016 the patient underwent left ultrasonography this showed a possible hypoechoic lesion in the upper outer quadrant of the left breast, measuring approximately 0.5 cm. The left axilla was mammographically benign.  Biopsy of the left breast area in question 04/09/2016 showed ([id number]) invasive ductal carcinoma, grade 1 or 2, estrogen and progesterone receptor negative, with an MIB-1 of 10%, but HER-2 amplified, the signals ratio being 7.36 and the number per cell 9.20.  Her subsequent history is as detailed below.  INTERVAL HISTORY: [name] was evaluated in the multidisciplinary breast cancer clinic 04/16/2016 accompanied by her daughter [name]. Her case was also presented in the multidisciplinary breast cancer conference that same morning. At that time a preliminary plan was proposed: Breast conserving surgery with sentinel lymph node sampling, then depending on the size of the tumor consideration of adjuvant chemotherapy with anti-HER-2 immunotherapy, and adjuvant radiation (which will necessitate pacemaker relocation).  REVIEW OF SYSTEMS: There were no specific symptoms leading to the original mammogram, which was  routinely scheduled. The patient denies unusual headaches, visual changes, nausea, vomiting, stiff neck, dizziness, or gait imbalance. There has been no cough, phlegm production, or pleurisy, no chest pain or pressure, and no change in bowel or bladder habits. The patient denies fever, rash, bleeding, unexplained fatigue or unexplained weight loss. She does have significant pain in her hands hips feet and legs which she describes as achy cramping and throbbing. She feels tired all the time. She has frequent sinus symptoms. 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 She has heartburn problems. She has psoriasis. She bruises easily. A detailed review of systems was otherwise entirely negative.   PAST MEDICAL HISTORY: Past Medical History:  Diagnosis Date  . Allergic rhinitis   . Family history of breast cancer   . Family history of colon cancer   . Family history of pancreatic cancer   . GERD (gastroesophageal reflux disease)   . GI bleed   . History of basal cell cancer   . Hyperlipidemia   . Hypertension   . Hypothyroidism   . Lumbar disc disease   . Malignant neoplasm of upper-outer quadrant of left female breast ([place]) 04/11/2016  . Peptic ulcer disease   . SSS (sick sinus syndrome) ([place])   . Thyroid disease     PAST SURGICAL HISTORY: Past Surgical History:  Procedure Laterality Date  . ABDOMINAL HYSTERECTOMY    . BREAST SURGERY    . HERNIA REPAIR    . PACEMAKER INSERTION      FAMILY HISTORY Family History  Problem Relation Age of Onset  . Congestive Heart Failure Mother   . Hypertension Father   . Bone cancer Father   . Breast cancer Sister 1  . Bradycardia Cousin   . Breast  cancer Cousin 29  . Breast cancer Cousin 37  . Colon cancer Cousin 60  . Pancreatic cancer Paternal Aunt 76  . Rectal cancer Paternal Aunt 75  . Heart attack Maternal Grandmother   . Heart attack Paternal Grandmother   . Stroke Paternal Grandfather   . Melanoma  Daughter     dx in her 38s  The patient's father died at the age of 71 with myeloma. The patient's mother died at the age of 71 from heart disease the patient has a sister diagnosed with breast cancer at age 45, a paternal cousin diagnosed with breast cancer at age 58, and a maternal cousin diagnosed with breast cancer. There is also a history of pancreatic, rectal, and: colon cancers all on the father's side of the family  GYNECOLOGIC HISTORY:  No LMP recorded. Patient has had a hysterectomy. Menarche age 56, first live birth age 48, the patient is [name]. She stopped having periods in 1981. She did not take hormone replacement. She used oral contraceptives remotely with no complications  SOCIAL HISTORY:  She has worked as a Pharmacist, hospital substituted, in Scientist, research (medical), and more recently as a Engineer, building services. Her husband Alexis(Bobby) [name] is under the care of hospice because of cirrhosis. The patient's daughter [name] lives in [place] where she works as a Pharmacist, hospital. Her daughter [name] lives in [place]. 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     ADVANCED DIRECTIVES: The patient's daughter, [name], is her healthcare power of attorney. She can be reached at [phone]   HEALTH MAINTENANCE: Social History  Substance Use Topics  . Smoking status: Never Smoker  . Smokeless tobacco: Never Used  . Alcohol use No     Colonoscopy: 2007/ [facility]  PAP:  Bone density: 03/28/2016 at [place], T score of -1.3   Allergies  Allergen Reactions  . Nsaids Other (See Comments)    Upper GI bleed  . Penicillins Itching  . Bee Venom   . Sulfa Antibiotics     itching  . Wasp Venom     Current Outpatient Prescriptions  Medication Sig Dispense Refill  . atorvastatin (LIPITOR) 80 MG tablet Take 80 mg by mouth daily.    [place] Kitchen esomeprazole (NEXIUM) 40 MG capsule Take 40 mg by mouth 2 (two) times daily before a meal.    . fenofibrate 160 MG tablet Take 80 mg by mouth daily.    . Fish Oil OIL Take  2,000 mg by mouth daily.     [place] Kitchen levothyroxine (SYNTHROID, LEVOTHROID) 100 MCG tablet Take 100 mcg by mouth daily before breakfast.    . triamterene-hydrochlorothiazide (MAXZIDE) 75-50 MG tablet Take by mouth daily.    . Vitamin D, Cholecalciferol, 1000 UNITS TABS Take 1 tablet by mouth daily.     [place] Kitchen albuterol (PROVENTIL HFA;VENTOLIN HFA) 108 (90 BASE) MCG/ACT inhaler Inhale into the lungs every 6 (six) hours as needed for wheezing or shortness of breath.    [place] Kitchen albuterol (PROVENTIL) (2.5 MG/3ML) 0.083% nebulizer solution Take 2.5 mg by nebulization every 6 (six) hours as needed for wheezing or shortness of breath.    [place] Kitchen azelastine (ASTELIN) 137 MCG/SPRAY nasal spray Place 2 sprays into both nostrils 2 (two) times daily. Use in each nostril as directed    . DIHYDROERGOTAMINE MESYLATE NA Inject 2 mLs as directed as needed.     . mometasone (NASONEX) 50 MCG/ACT nasal spray Place 2 sprays into the nose as needed.     . promethazine (PHENERGAN) 25 MG tablet  Take 25 mg by mouth every 6 (six) hours as needed for nausea or vomiting.     No current facility-administered medications for this visit.     OBJECTIVE: Middle-aged white woman in no acute distress Vitals:   04/16/16 0851  BP: (!) 156/87  Pulse: 81  Resp: 20  Temp: 97.6 F (36.4 C)     Body mass index is 29.06 kg/m.    ECOG FS:1 - Symptomatic but completely ambulatory  Ocular: Sclerae unicteric, pupils equal, round and reactive to light Ear-nose-throat: Oropharynx clear and moist Lymphatic: No cervical or supraclavicular adenopathy Lungs no rales or rhonchi, good excursion bilaterally Heart regular rate and rhythm, no murmur appreciated Abd soft, nontender, positive bowel sounds MSK no focal spinal tenderness, no joint edema Neuro: non-focal, well-oriented, appropriate affect Breasts: The right breast is unremarkable. I do not palpate a mass in the left breast. There are no skin or nipple changes of concern. The left axilla is  benign.   LAB RESULTS:  CMP     Component Value Date/Time   NA 142 04/16/2016 0822   K 3.8 04/16/2016 0822   CL 110 01/18/2007 0300   CO2 23 04/16/2016 0822   GLUCOSE 102 04/16/2016 0822   BUN 25.3 04/16/2016 0822   CREATININE 0.8 04/16/2016 0822   CALCIUM 9.7 04/16/2016 0822   PROT 6.9 04/16/2016 0822   ALBUMIN 3.8 04/16/2016 0822   AST 20 04/16/2016 0822   ALT 18 04/16/2016 0822   ALKPHOS 68 04/16/2016 0822   BILITOT 0.60 04/16/2016 0822   GFRNONAA >60 01/18/2007 0300   GFRAA  01/18/2007 0300    >60        The eGFR has been calculated using the MDRD equation. This calculation has not been validated in all clinical    INo results found for: SPEP, UPEP  Lab Results  Component Value Date   WBC 5.7 04/16/2016   NEUTROABS 3.7 04/16/2016   HGB 13.2 04/16/2016   HCT 38.7 04/16/2016   MCV 86.2 04/16/2016   PLT 321 04/16/2016      Chemistry      Component Value Date/Time   NA 142 04/16/2016 0822   K 3.8 04/16/2016 0822   CL 110 01/18/2007 0300   CO2 23 04/16/2016 0822   BUN 25.3 04/16/2016 0822   CREATININE 0.8 04/16/2016 0822      Component Value Date/Time   CALCIUM 9.7 04/16/2016 0822   ALKPHOS 68 04/16/2016 0822   AST 20 04/16/2016 0822   ALT 18 04/16/2016 0822   BILITOT 0.60 04/16/2016 0822       No results found for: LABCA2  No components found for: LABCA125  No results for input(s): INR in the last 168 hours.  Urinalysis No results found for: COLORURINE, APPEARANCEUR, LABSPEC, PHURINE, GLUCOSEU, HGBUR, BILIRUBINUR, KETONESUR, PROTEINUR, UROBILINOGEN, NITRITE, LEUKOCYTESUR   STUDIES: Outside studies reviewed with patient  ELIGIBLE FOR AVAILABLE RESEARCH PROTOCOL: no  ASSESSMENT: 70 y.[place]. [place] woman status post left breast upper outer quadrant biopsy 04/09/2016 for a clinical T1a N0, stage IA invasive ductal carcinoma, grade 1 or 2, estrogen and progesterone receptor negative, with an MIB-1 of 10%, but HER-2 amplified  (1) definitive  surgery pending  (2) consideration of anti-HER-2 immunotherapy/chemotherapy depending on final tumor size  (3) adjuvant radiation will require relocation of the patient's pacemaker  PLAN: We spent the better part of today's hour-long appointment discussing the biology of breast cancer in general, and the specifics of the patient's tumor in particular. We first  reviewed the fact that cancer is not one disease but more than 100 different diseases and that it is important to keep them separate-- otherwise when friends and relatives discuss their own cancer experiences with [place] confusion can result. Similarly we discussed that if breast cancer spreads to the bones, lungs or liver, Deshundra  would not have bone cancer or liver cancer, but breast cancer in the bone and breast cancer in the liver-- one cancer in three places-- not three different cancers, which otherwise would have to be treated in 3 different ways.  We emphasized the difference between local and systemic therapies. In terms of loco/regional treatment, lumpectomy plus radiation is equivalent to mastectomy as far as survival is concerned. We also noted that in terms of sequencing of treatments, whether systemic therapy or surgery is done first does not affect the ultimate outcome.  One concern regarding radiation treatments is the fact that her pacemaker is currently on the left side. However the patient tells me her pacemaker needs to be changed in the next 2-3 months. Possibly then it could be relocated at the same time to facilitate adjuvant radiation.  We then reviewed systemic therapy options. These include anti-estrogens, anti-HER-2 treatment, and chemotherapy. She is not a candidate for anti-estrogen therapy. She is a good candidate for anti-HER-2 immunotherapy, which is given with chemotherapy. However, if her breast cancer is 5 mm or less, even in HER-2 positive tumors NCCN guidelines recommend no systemic therapy.  Accordingly we  will await final surgical results before making a definitive decision regarding systemic treatment.  Callahan has a good understanding of the overall plan. She agrees with it. She knows the goal of treatment in her case is cure. She will call with any problems that may develop before her next visit here.  Chauncey Cruel, MD   04/19/2016 10:55 AM Medical Oncology and Hematology [facility] [address] [place], [place] [address] Tel. [phone]    Fax. [phone]

## 2016-04-16 NOTE — Therapy (Signed)
[place], [place], [address] Phone: [phone]   Fax:  [phone]  Physical Therapy Evaluation  Patient Details  Name: [name] MRN: [id number] Date of Birth: Oct 24, 1945 Referring Provider: Dr. Alphonsa Overall  Encounter Date: 04/16/2016      PT End of Session - 04/16/16 1156    Visit Number 1   Number of Visits 1   PT Start Time 1021   PT Stop Time 1050   PT Time Calculation (min) 29 min   Activity Tolerance Patient tolerated treatment well   Behavior During Therapy [facility] for tasks assessed/performed      Past Medical History:  Diagnosis Date  . Allergic rhinitis   . GERD (gastroesophageal reflux disease)   . GI bleed   . History of basal cell cancer   . Hyperlipidemia   . Hypertension   . Hypothyroidism   . Lumbar disc disease   . Malignant neoplasm of upper-outer quadrant of left female breast ([place]) 04/11/2016  . Peptic ulcer disease   . SSS (sick sinus syndrome) ([place])   . Thyroid disease     Past Surgical History:  Procedure Laterality Date  . ABDOMINAL HYSTERECTOMY    . BREAST SURGERY    . HERNIA REPAIR    . PACEMAKER INSERTION      There were no vitals filed for this visit.       Subjective Assessment - 04/16/16 1121    Subjective Patient reports she is here today to be seen for her newly diagnosed breast cancer.   Patient is accompained by: Family member   Pertinent History Patient was diagnosed 03/28/16 with left grade 1-2 invasive breast cancer.  It measures 5 mm, is ER/PR negative and HER2 positive.   Patient Stated Goals Reduce lymphedema risk and learn post op shoulder ROM HEP   Currently in Pain? No/denies  Has some chronic osteoarthritis throughout joints            [facility] PT Assessment - 04/16/16 0001      Assessment   Medical Diagnosis Lt breast cancer   Referring Provider Dr. Alphonsa Overall   Onset Date/Surgical Date 03/28/16   Hand Dominance Right    Prior Therapy none     Precautions   Precautions Other (comment)   Precaution Comments Active cancer; pacemaker; osteopenia     Restrictions   Weight Bearing Restrictions No     Balance Screen   Has the patient fallen in the past 6 months No   Has the patient had a decrease in activity level because of a fear of falling?  No   Is the patient reluctant to leave their home because of a fear of falling?  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   Leisure She reports that she walks "some" but her job is also physically demanding     Cognition   Overall Cognitive Status Within Functional Limits for tasks assessed     Posture/Postural Control   Posture/Postural Control Postural limitations   Postural Limitations Rounded Shoulders;Forward head;Increased thoracic kyphosis     ROM / Strength   AROM / PROM / Strength AROM;Strength     AROM   AROM Assessment Site Shoulder;Cervical  Right/Left Shoulder Right;Left   Right Shoulder Extension 58 Degrees   Right Shoulder Flexion 168 Degrees   Right Shoulder ABduction 177 Degrees   Right Shoulder Internal Rotation 71 Degrees   Right Shoulder External Rotation 63 Degrees   Left Shoulder Extension 40 Degrees   Left Shoulder Flexion 152 Degrees   Left Shoulder ABduction 160 Degrees   Left Shoulder Internal Rotation 79 Degrees   Left Shoulder External Rotation 72 Degrees   Cervical Flexion WNL   Cervical Extension WNL   Cervical - Right Side Bend 25% limited   Cervical - Left Side Bend 25% limited   Cervical - Right Rotation WNL   Cervical - Left Rotation WNL     Strength   Overall Strength Within functional limits for tasks performed           LYMPHEDEMA/ONCOLOGY QUESTIONNAIRE -  04/16/16 1151      Type   Cancer Type Left breast cancer     Lymphedema Assessments   Lymphedema Assessments Upper extremities     Right Upper Extremity Lymphedema   10 cm Proximal to Olecranon Process 26.1 cm   Olecranon Process 24.1 cm   10 cm Proximal to Ulnar Styloid Process 19.4 cm   Just Proximal to Ulnar Styloid Process 14.1 cm   Across Hand at [employer] 18.5 cm   At [place] of 2nd Digit 5.8 cm     Left Upper Extremity Lymphedema   10 cm Proximal to Olecranon Process 28.3 cm   Olecranon Process 23.9 cm   10 cm Proximal to Ulnar Styloid Process 19.9 cm   Just Proximal to Ulnar Styloid Process 14.2 cm   Across Hand at [employer] 18.2 cm   At [place] of 2nd Digit 5.7 cm      Patient was instructed today in a home exercise program today for post op shoulder range of motion. These included active assist shoulder flexion in sitting, scapular retraction, wall walking with shoulder abduction, and hands behind head external rotation.  She was encouraged to do these twice a day, holding 3 seconds and repeating 5 times when permitted by her physician.         PT Education - 04/16/16 1155    Education provided Yes   Education Details Lymphedema risk reduction and post op shoulder ROM HEP   Person(s) Educated Patient;Child(ren)   Methods Explanation;Demonstration;Handout   Comprehension Returned demonstration;Verbalized understanding              Breast Clinic Goals - 04/16/16 1207      Patient will be able to verbalize understanding of pertinent lymphedema risk reduction practices relevant to her diagnosis specifically related to skin care.   Time 1   Period Days   Status Achieved     Patient will be able to return demonstrate and/or verbalize understanding of the post-op home exercise program related to regaining shoulder range of motion.   Time 1   Period Days   Status Achieved     Patient will be able to verbalize understanding of the importance of  attending the postoperative After Breast Cancer Class for further lymphedema risk reduction education and therapeutic exercise.   Time 1   Period Days   Status Achieved              Plan - 04/16/16 1156    Clinical Impression Statement Patient was diagnosed 03/28/16 with left grade 1-2 invasive breast cancer.  It measures 5 mm, is ER/PR negative and HER2  positive. 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 Her multidisciplinary medical team met prior to her assessments to deterine a recommended treatment plan.  She is planning to have genetic testing, a left lumpectomy and sentinel node biopsy, possible chemo and possible radiation.  She may benefit from post op PT to regain shoulder ROM and reduce lymphedema risk.  Due to her current situation of needing to provide full time care to her disabled husband and also needing to work full time, and having several comorbidities, her eval is of moderate complexity.   Rehab Potential Excellent   Clinical Impairments Affecting Rehab Potential none   PT Frequency One time visit   PT Treatment/Interventions Patient/family education;Therapeutic exercise   PT Next Visit Plan Will f/u after surgery to determine PT needs   PT Home Exercise Plan Post op shoulder ROM and lymphedema risk reduction   Consulted and Agree with Plan of Care Patient;Family member/caregiver   Family Member Consulted daughter      Patient will benefit from skilled therapeutic intervention in order to improve the following deficits and impairments:  Postural dysfunction, Decreased knowledge of precautions, Pain, Impaired UE functional use, Decreased range of motion  Visit Diagnosis: Carcinoma of upper-outer quadrant of left breast in female, estrogen receptor negative ([place]) - Plan: PT plan of care cert/re-cert  Abnormal posture - Plan: PT plan of care cert/re-cert      G-Codes - 17/47/15 09-05-1205     Functional Assessment Tool Used Clinical judgement   Functional Limitation Other PT primary   Other PT Primary Current Status (N5396) At least 1 percent but less than 20 percent impaired, limited or restricted   Other PT Primary Goal Status (D2897) At least 1 percent but less than 20 percent impaired, limited or restricted   Other PT Primary Discharge Status (V1504) At least 1 percent but less than 20 percent impaired, limited or restricted       Problem List Patient Active Problem List   Diagnosis Date Noted  . Malignant neoplasm of upper-outer quadrant of left female breast ([place]) 04/11/2016  . Essential hypertension 09/06/2013  . Sick sinus syndrome ([place]) 09/06/2013  . History of permanent cardiac pacemaker placement 09/06/2013  . Hyperlipidemia 09/06/2013   Annia Friendly, PT 04/16/16 12:12 PM  [place], [place], [address] Phone: [phone]   Fax:  [phone]  Name: [name] MRN: [id number] Date of Birth: December 05, 1945

## 2016-04-17 ENCOUNTER — Other Ambulatory Visit: Payer: Medicare Other

## 2016-04-17 ENCOUNTER — Encounter: Payer: Medicare Other | Admitting: Genetic Counselor

## 2016-04-18 ENCOUNTER — Telehealth: Payer: Self-pay | Admitting: Interventional Cardiology

## 2016-04-18 NOTE — Telephone Encounter (Signed)
New message   Pt wantd to speak to rn she verbalized that she had been diagnosed with Cancer and that her pace maker needs to be moved to the other side.

## 2016-04-18 NOTE — Telephone Encounter (Signed)
Patient recently diagnosed with breast cancer- will forward to Dr. Caryl Comes to review.

## 2016-04-21 ENCOUNTER — Telehealth: Payer: Self-pay | Admitting: *Deleted

## 2016-04-21 NOTE — Telephone Encounter (Signed)
  Oncology Nurse Navigator Documentation  Navigator Location: [facility] (04/21/16 1000)   )Navigator Encounter Type: Telephone (04/21/16 1000) Telephone: [name] Call;Clinic/MDC Follow-up (04/21/16 1000)  Denies questions or concerns regarding dx or treatment care plan. Encourage pt to call with needs. Received verbal understanding.     Genetic Counseling Date: 04/17/16 (04/21/16 1000) Genetic Counseling Type: Urgent (04/21/16 1000)                                        Time Spent with Patient: 15 (04/21/16 1000)

## 2016-04-22 NOTE — Telephone Encounter (Signed)
Thanks

## 2016-04-22 NOTE — Telephone Encounter (Signed)
I called and spoke with the patient. I advised her Dr. Caryl Comes was aware of her diagnosis of breast cancer and that her device will need to be moved. He wants her to come to the office to discuss this.  I have scheduled her for 11/30 at 3:00 pm with Dr. Caryl Comes. I advised her I would advise Dr. Tamala Julian of this as well as she wanted to make sure he knew what was going on.

## 2016-05-01 ENCOUNTER — Ambulatory Visit (INDEPENDENT_AMBULATORY_CARE_PROVIDER_SITE_OTHER): Payer: Medicare Other | Admitting: Internal Medicine

## 2016-05-01 ENCOUNTER — Ambulatory Visit (INDEPENDENT_AMBULATORY_CARE_PROVIDER_SITE_OTHER): Payer: Medicare Other | Admitting: Cardiology

## 2016-05-01 ENCOUNTER — Encounter (INDEPENDENT_AMBULATORY_CARE_PROVIDER_SITE_OTHER): Payer: Self-pay

## 2016-05-01 ENCOUNTER — Encounter: Payer: Self-pay | Admitting: Internal Medicine

## 2016-05-01 ENCOUNTER — Telehealth: Payer: Self-pay | Admitting: Genetic Counselor

## 2016-05-01 ENCOUNTER — Encounter: Payer: Self-pay | Admitting: Genetic Counselor

## 2016-05-01 ENCOUNTER — Encounter: Payer: Self-pay | Admitting: Cardiology

## 2016-05-01 VITALS — BP 130/80 | HR 62 | Ht 63.23 in | Wt 156.0 lb

## 2016-05-01 VITALS — BP 130/86 | HR 76 | Ht 63.0 in | Wt 157.2 lb

## 2016-05-01 DIAGNOSIS — I10 Essential (primary) hypertension: Secondary | ICD-10-CM

## 2016-05-01 DIAGNOSIS — Z01812 Encounter for preprocedural laboratory examination: Secondary | ICD-10-CM | POA: Diagnosis not present

## 2016-05-01 DIAGNOSIS — Z950 Presence of cardiac pacemaker: Secondary | ICD-10-CM

## 2016-05-01 DIAGNOSIS — I447 Left bundle-branch block, unspecified: Secondary | ICD-10-CM | POA: Diagnosis not present

## 2016-05-01 DIAGNOSIS — I495 Sick sinus syndrome: Secondary | ICD-10-CM

## 2016-05-01 DIAGNOSIS — Z1379 Encounter for other screening for genetic and chromosomal anomalies: Secondary | ICD-10-CM | POA: Insufficient documentation

## 2016-05-01 LAB — BASIC METABOLIC PANEL
BUN: 18 mg/dL (ref 7–25)
CALCIUM: 9.6 mg/dL (ref 8.6–10.4)
CO2: 25 mmol/L (ref 20–31)
Chloride: 103 mmol/L (ref 98–110)
Creat: 0.97 mg/dL — ABNORMAL HIGH (ref 0.60–0.93)
GLUCOSE: 87 mg/dL (ref 65–99)
Potassium: 4 mmol/L (ref 3.5–5.3)
SODIUM: 140 mmol/L (ref 135–146)

## 2016-05-01 LAB — CBC WITH DIFFERENTIAL/PLATELET
BASOS PCT: 1 %
Basophils Absolute: 56 cells/uL (ref 0–200)
EOS ABS: 168 {cells}/uL (ref 15–500)
Eosinophils Relative: 3 %
HCT: 40.4 % (ref 35.0–45.0)
Hemoglobin: 13.5 g/dL (ref 11.7–15.5)
LYMPHS PCT: 32 %
Lymphs Abs: 1792 cells/uL (ref 850–3900)
MCH: 28.7 pg (ref 27.0–33.0)
MCHC: 33.4 g/dL (ref 32.0–36.0)
MCV: 85.8 fL (ref 80.0–100.0)
MONOS PCT: 10 %
MPV: 8.9 fL (ref 7.5–12.5)
Monocytes Absolute: 560 cells/uL (ref 200–950)
Neutro Abs: 3024 cells/uL (ref 1500–7800)
Neutrophils Relative %: 54 %
PLATELETS: 374 10*3/uL (ref 140–400)
RBC: 4.71 MIL/uL (ref 3.80–5.10)
RDW: 13.8 % (ref 11.0–15.0)
WBC: 5.6 10*3/uL (ref 3.8–10.8)

## 2016-05-01 NOTE — Telephone Encounter (Signed)
LM on VM with good news. Asked that she CB for results. 

## 2016-05-01 NOTE — Progress Notes (Signed)
Patient Care Team: [name], MD as PCP - General (Family Medicine) Chauncey Cruel, MD as Consulting Physician (Oncology) Gery Pray, MD as Consulting Physician (Radiation Oncology) Alphonsa Overall, MD as Consulting Physician (General Surgery) Teena Irani, MD as Consulting Physician (Gastroenterology)   HPI  [name] is a 70 y.o. female Is seen at the request of radiation oncology to discuss repositioning of her pacemaker pulse generator from the left side to the right side move it out of the way of anticipated radiation therapy for breast cancer. She is scheduled for surgery with Dr. Lucia Gaskins of 06/06/16. ; Radiation therapy is to follow  Device was originally implanted about 15+ years ago and she underwent generator replacement in 2008. Her device is approaching ERI.  Myoview 8/16 demonstrated normal LV function and no ischemia     Past Medical History:  Diagnosis Date  . Allergic rhinitis   . Family history of breast cancer   . Family history of colon cancer   . Family history of pancreatic cancer   . GERD (gastroesophageal reflux disease)   . GI bleed   . History of basal cell cancer   . Hyperlipidemia   . Hypertension   . Hypothyroidism   . Lumbar disc disease   . Malignant neoplasm of upper-outer quadrant of left female breast ([place]) 04/11/2016  . Pacemaker   . Peptic ulcer disease   . SSS (sick sinus syndrome) ([place])   . Thyroid disease     Past Surgical History:  Procedure Laterality Date  . ABDOMINAL HYSTERECTOMY    . BREAST SURGERY    . HERNIA REPAIR    . PACEMAKER INSERTION      Current Outpatient Prescriptions  Medication Sig Dispense Refill  . albuterol (PROVENTIL HFA;VENTOLIN HFA) 108 (90 BASE) MCG/ACT inhaler Inhale into the lungs every 6 (six) hours as needed for wheezing or shortness of breath.    Marland Kitchen albuterol (PROVENTIL) (2.5 MG/3ML) 0.083% nebulizer solution Take 2.5 mg by nebulization every 6 (six) hours as needed for wheezing or  shortness of breath.    Marland Kitchen atorvastatin (LIPITOR) 80 MG tablet Take 80 mg by mouth daily.    Marland Kitchen azelastine (ASTELIN) 137 MCG/SPRAY nasal spray Place 2 sprays into both nostrils 2 (two) times daily. Use in each nostril as directed    . DIHYDROERGOTAMINE MESYLATE NA Inject 2 mLs as directed as needed.     Marland Kitchen esomeprazole (NEXIUM) 40 MG capsule Take 40 mg by mouth 2 (two) times daily before a meal.    . fenofibrate 160 MG tablet Take 80 mg by mouth daily.    . Fish Oil OIL Take 2,000 mg by mouth daily.     Marland Kitchen levothyroxine (SYNTHROID, LEVOTHROID) 100 MCG tablet Take 100 mcg by mouth daily before breakfast.    . mometasone (NASONEX) 50 MCG/ACT nasal spray Place 2 sprays into the nose as needed.     . promethazine (PHENERGAN) 25 MG tablet Take 25 mg by mouth every 6 (six) hours as needed for nausea or vomiting.    . triamterene-hydrochlorothiazide (MAXZIDE) 75-50 MG tablet Take by mouth daily.    . Vitamin D, Cholecalciferol, 1000 UNITS TABS Take 1 tablet by mouth daily.      No current facility-administered medications for this visit.     Allergies  Allergen Reactions  . Nsaids Other (See Comments)    Upper GI bleed  . Penicillins Itching  . Bee Venom   . Sulfa Antibiotics  itching  . Wasp Venom       Review of Systems negative except from HPI and PMH  Physical Exam BP 130/86   Pulse 76   Ht 5\' 3"  (1.6 m)   Wt 157 lb 3.2 oz (71.3 kg)   SpO2 96%   BMI 27.85 kg/m  Well developed and well nourished in no acute distress HENT normal E scleral and icterus clear Neck Supple JVP flat; carotids brisk and full Clear to ausculation  Device pocket well healed; without hematoma or erythema.  There is no tethering Regular rate and rhythm, no murmurs gallops or rub Soft with active bowel sounds No clubbing cyanosis  Edema Alert and oriented, grossly normal motor and sensory function Skin Warm and Dry  ECG Apacing   Assessment and  Plan  Sinus node dysfunction  Breast Cancer    Pacemaker L sided with device approaching ERI  The patient has anticipated radiation therapy. Her device is approaching ERI. I have spoken with the radiation oncologist. Device will need to be moved. Radiographically there appears to be possibly enough slack to allow the least moved to the right side without need for extenders. We have reviewed the benefits and risks of generator replacement.  These include but are not limited to lead fracture and infection.  The patient understands, agrees and is willing to proceed.      My schedule does not permit for number of weeks. Is Dr. Dr. Lovena Le to see if he get it done for more expeditiously.   Current medicines are reviewed at length with the patient today .  The patient does not  have concerns regarding medicines.

## 2016-05-01 NOTE — Progress Notes (Signed)
05/01/2016 [place]   December 30, 1945  MD:[id number]  Primary Physician Gennette Pac, MD Primary Cardiologist: Dr. Tamala Julian Electrophysiologist: Dr. Caryl Comes   Reason for Visit/CC: Surgical Clearance  HPI:  [name] presents to clinic today for surgical clearance prior to undergoing left breast lumpectomy with radioactive seed placement + left axillary sentinel lymph node biopsy.    She has a h/o sinus node dysfunction s/p PPM, followed by Dr. Caryl Comes, as well as a h/o HTN and HLD. She has also been followed in the past by Dr. Tamala Julian. She has no known h/o of coronary disease. She as a LBBB. She had a NST 01/11/15 that showed no ischemia. EF was normal at 55-60%. This was a low risk study. Her last device interrogation was 04/08/15. This was a remote transmission which showed some NSVT and atrial tach but overall normal device function.   The patient also has an appt with Dr. Caryl Comes today to discuss relocation of her PPM due to breast CA and need for radiation. Her device will be interrogated later today.  She denies any anginal symptomatology. She is able to ambulate a flight of stairs w/o exertional CP or dyspnea. No syncope/ near syncope, dizziness, LEE, orthopnea or PND.   EKG today shows chronic LBBB with atrial pacemaker. HR 62 bpm. BP is well controlled at 130/80.    Current Meds  Medication Sig  . albuterol (PROVENTIL HFA;VENTOLIN HFA) 108 (90 BASE) MCG/ACT inhaler Inhale into the lungs every 6 (six) hours as needed for wheezing or shortness of breath.  Marland Kitchen albuterol (PROVENTIL) (2.5 MG/3ML) 0.083% nebulizer solution Take 2.5 mg by nebulization every 6 (six) hours as needed for wheezing or shortness of breath.  Marland Kitchen atorvastatin (LIPITOR) 80 MG tablet Take 80 mg by mouth daily.  Marland Kitchen azelastine (ASTELIN) 137 MCG/SPRAY nasal spray Place 2 sprays into both nostrils 2 (two) times daily. Use in each nostril as directed  . DIHYDROERGOTAMINE MESYLATE NA Inject 2 mLs as directed as needed.   Marland Kitchen  esomeprazole (NEXIUM) 40 MG capsule Take 40 mg by mouth 2 (two) times daily before a meal.  . fenofibrate 160 MG tablet Take 80 mg by mouth daily.  . Fish Oil OIL Take 2,000 mg by mouth daily.   Marland Kitchen levothyroxine (SYNTHROID, LEVOTHROID) 100 MCG tablet Take 100 mcg by mouth daily before breakfast.  . mometasone (NASONEX) 50 MCG/ACT nasal spray Place 2 sprays into the nose as needed.   . promethazine (PHENERGAN) 25 MG tablet Take 25 mg by mouth every 6 (six) hours as needed for nausea or vomiting.  . triamterene-hydrochlorothiazide (MAXZIDE) 75-50 MG tablet Take by mouth daily.  . Vitamin D, Cholecalciferol, 1000 UNITS TABS Take 1 tablet by mouth daily.    Allergies  Allergen Reactions  . Nsaids Other (See Comments)    Upper GI bleed  . Penicillins Itching  . Bee Venom   . Sulfa Antibiotics     itching  . Wasp Venom    Past Medical History:  Diagnosis Date  . Allergic rhinitis   . Family history of breast cancer   . Family history of colon cancer   . Family history of pancreatic cancer   . GERD (gastroesophageal reflux disease)   . GI bleed   . History of basal cell cancer   . Hyperlipidemia   . Hypertension   . Hypothyroidism   . Lumbar disc disease   . Malignant neoplasm of upper-outer quadrant of left female breast ([place]) 04/11/2016  . Pacemaker   .  Peptic ulcer disease   . SSS (sick sinus syndrome) ([place])   . Thyroid disease    Family History  Problem Relation Age of Onset  . Congestive Heart Failure Mother   . Hypertension Father   . Bone cancer Father   . Breast cancer Sister 58  . Bradycardia Cousin   . Breast cancer Cousin 51  . Breast cancer Cousin 28  . Colon cancer Cousin 12  . Pancreatic cancer Paternal Aunt 76  . Rectal cancer Paternal Aunt 14  . Heart attack Maternal Grandmother   . Heart attack Paternal Grandmother   . Stroke Paternal Grandfather   . Melanoma Daughter     dx in her 55s   Past Surgical History:  Procedure Laterality Date  . ABDOMINAL  HYSTERECTOMY    . BREAST SURGERY    . HERNIA REPAIR    . PACEMAKER INSERTION     Social History   Social History  . Marital status: Married    Spouse name: N/A  . Number of children: 2  . Years of education: N/A   Occupational History  . Not on file.   Social History Main Topics  . Smoking status: Never Smoker  . Smokeless tobacco: Never Used  . Alcohol use No  . Drug use: No  . Sexual activity: No   Other Topics Concern  . Not on file   Social History Narrative  . No narrative on file     Review of Systems: General: negative for chills, fever, night sweats or weight changes.  Cardiovascular: negative for chest pain, dyspnea on exertion, edema, orthopnea, palpitations, paroxysmal nocturnal dyspnea or shortness of breath Dermatological: negative for rash Respiratory: negative for cough or wheezing Urologic: negative for hematuria Abdominal: negative for nausea, vomiting, diarrhea, bright red blood per rectum, melena, or hematemesis Neurologic: negative for visual changes, syncope, or dizziness All other systems reviewed and are otherwise negative except as noted above.   Physical Exam:  Blood pressure 130/80, height 5' 3.23" (1.606 m), weight 156 lb (70.8 kg).  General appearance: alert, cooperative and no distress Neck: no carotid bruit and no JVD Lungs: clear to auscultation bilaterally Heart: regular rate and rhythm, S1, S2 normal, no murmur, click, rub or gallop Extremities: extremities normal, atraumatic, no cyanosis or edema Pulses: 2+ and symmetric Skin: Skin color, texture, turgor normal. No rashes or lesions Neurologic: Grossly normal  EKG chronic LBBB, electronic atrial pacemaker.   ASSESSMENT AND PLAN:   1. Sinus Node Dysfunction: s/p Med[employer] PPM insertion. Last device interrogation was 01/2015 which showed normal device function with NSVT and atrial tach. The patient also has an appt with Dr. Caryl Comes today to discuss relocation of her PPM due to  breast CA and need for radiation. Her device will be interrogated later today.  2. HTN: well controlled at 130/80.   3. HLD:  On Lipitor and fish oil. Followed by PCP.   4. Chronic LBBB: seen on prior EKGs. Was present on EKG 01/2015 with subsequent NST showing no ischemia with normal LVEF. She denies CP.   4. Pre-operative Assessment: Pt with no known h/o coronary disease. She had a NST a little over 1 year ago, 01/2015 that was low risk and negative for ischemia with normal EF. She denies any anginal symptomatology. She is able to ambuate a flight of stairs (4 + Mets of physical activity) w/o exertional chest pain or dyspnea. No EKG changes. We are awaiting PPM device interrogation by the EP staff later today. If  normal device function, she can be cleared for surgery.    PLAN  Keep EP clinic appt with Dr. Caryl Comes today and f/u with Dr. Tamala Julian 06/2017.   Lyda Jester PA-C 05/01/2016 8:29 AM

## 2016-05-01 NOTE — Patient Instructions (Addendum)
Medication Instructions:  Your physician recommends that you continue on your current medications as directed. Please refer to the Current Medication list given to you today.   Labwork: Your physician recommends that you return for lab work in: TODAY (bmet, cbc, pt/inr, ptt)   Testing/Procedures: Dates for possible procedure: with Dr. Crissie Sickles Friday,12/8 - preferred dated Monday, 12/11 Thursday, 12/14  We will follow up with your to schedule a date that works best for you once we have approval from Dr. Lovena Le.   Any Other Special Instructions Will Be Listed Below (If Applicable).     If you need a refill on your cardiac medications before your next appointment, please call your pharmacy.

## 2016-05-01 NOTE — Patient Instructions (Signed)
Medication Instructions:  Your physician recommends that you continue on your current medications as directed. Please refer to the Current Medication list given to you today.   Labwork: None   Testing/Procedures: None   Follow-Up: Your physician recommends that you schedule a follow-up appointment this afternoon with Dr Caryl Comes at 3 PM.  Your physician recommends that you schedule a follow-up appointment in: January 2018 with Dr Tamala Julian.       If you need a refill on your cardiac medications before your next appointment, please call your pharmacy.

## 2016-05-02 ENCOUNTER — Telehealth: Payer: Self-pay | Admitting: Oncology

## 2016-05-02 LAB — CUP PACEART INCLINIC DEVICE CHECK
Battery Voltage: 2.62 V
Brady Statistic AP VS Percent: 90 %
Brady Statistic AS VP Percent: 0 %
Brady Statistic AS VS Percent: 9 %
Date Time Interrogation Session: 20171130213917
Implantable Lead Implant Date: 19970926
Implantable Lead Implant Date: 19970926
Implantable Lead Location: 753859
Lead Channel Impedance Value: 885 Ohm
Lead Channel Pacing Threshold Amplitude: 1 V
Lead Channel Pacing Threshold Pulse Width: 0.4 ms
Lead Channel Pacing Threshold Pulse Width: 0.4 ms
Lead Channel Sensing Intrinsic Amplitude: 8 mV
Lead Channel Setting Pacing Amplitude: 2.75 V
Lead Channel Setting Sensing Sensitivity: 4 mV
MDC IDC LEAD LOCATION: 753860
MDC IDC MSMT BATTERY IMPEDANCE: 4532 Ohm
MDC IDC MSMT BATTERY REMAINING LONGEVITY: 5 mo
MDC IDC MSMT LEADCHNL RA IMPEDANCE VALUE: 384 Ohm
MDC IDC MSMT LEADCHNL RA PACING THRESHOLD AMPLITUDE: 1.25 V
MDC IDC MSMT LEADCHNL RA SENSING INTR AMPL: 2 mV
MDC IDC PG IMPLANT DT: 20080207
MDC IDC SET LEADCHNL RV PACING AMPLITUDE: 2.25 V
MDC IDC SET LEADCHNL RV PACING PULSEWIDTH: 0.4 ms
MDC IDC STAT BRADY AP VP PERCENT: 0 %

## 2016-05-02 LAB — PROTIME-INR
INR: 1.1
PROTHROMBIN TIME: 11.2 s (ref 9.0–11.5)

## 2016-05-02 LAB — APTT: aPTT: 27 s (ref 22–34)

## 2016-05-02 NOTE — Telephone Encounter (Signed)
LM on VM with good news.  Asked that she CB. 

## 2016-05-02 NOTE — Telephone Encounter (Signed)
sw pt to confirm r/s Dec appt to 06/16/16 per LOS

## 2016-05-02 NOTE — Telephone Encounter (Signed)
Negative genetic testing on the Comprehensive cancer panel.  Discussed that we do not know why she has developed breast cancer or why there is cancer in the family. We asked that she keep in contact with [place] every couple years to ensure that there is not any further testing that would need to be performed.

## 2016-05-05 ENCOUNTER — Telehealth: Payer: Self-pay | Admitting: Internal Medicine

## 2016-05-05 NOTE — Telephone Encounter (Signed)
Pt is wondering when she will get her new device

## 2016-05-05 NOTE — Telephone Encounter (Signed)
Pt states Dr Caryl Comes mentioned scheduling device change for this Friday Dec 8. Pt states she needs to make arrangements for work and wants to confirm device change will be done Dec 8. Pt advised I will ask Heather to follow up with her tomorrow, I am unable to confirm procedure will be scheduled Dec 8.

## 2016-05-06 ENCOUNTER — Encounter: Payer: Self-pay | Admitting: *Deleted

## 2016-05-06 NOTE — Telephone Encounter (Signed)
I called and spoke with the patient- she is aware that Dr. Lovena Le can do her procedure on 05/15/16. She is agreeable. I advised I will call to schedule this for her and call back to confirm the date/ time.

## 2016-05-06 NOTE — Telephone Encounter (Signed)
The patient is aware her gen change is on 12/14 at 7:30 am (arrive at 5:30 am). She will come pick up a letter of instructions and her surgical scrub.

## 2016-05-09 ENCOUNTER — Encounter: Payer: Medicare Other | Admitting: Nurse Practitioner

## 2016-05-09 ENCOUNTER — Ambulatory Visit: Payer: Self-pay | Admitting: Genetic Counselor

## 2016-05-09 DIAGNOSIS — Z170 Estrogen receptor positive status [ER+]: Secondary | ICD-10-CM

## 2016-05-09 DIAGNOSIS — Z803 Family history of malignant neoplasm of breast: Secondary | ICD-10-CM

## 2016-05-09 DIAGNOSIS — Z1379 Encounter for other screening for genetic and chromosomal anomalies: Secondary | ICD-10-CM

## 2016-05-09 DIAGNOSIS — Z800 Family history of malignant neoplasm of digestive organs: Secondary | ICD-10-CM

## 2016-05-09 DIAGNOSIS — C50412 Malignant neoplasm of upper-outer quadrant of left female breast: Secondary | ICD-10-CM

## 2016-05-09 NOTE — Progress Notes (Signed)
HPI: [name] was previously seen in the [facility] due to a personal and family history of cancer and concerns regarding a hereditary predisposition to cancer. Please refer to our prior cancer genetics clinic note for more information regarding [name]'s medical, social and family histories, and our assessment and recommendations, at the time. [name] recent genetic test results were disclosed to her, as were recommendations warranted by these results. These results and recommendations are discussed in more detail below.  FAMILY HISTORY:  We obtained a detailed, 4-generation family history.  Significant diagnoses are listed below: Family History  Problem Relation Age of Onset  . Congestive Heart Failure Mother   . Hypertension Father   . Bone cancer Father   . Breast cancer Sister 90  . Bradycardia Cousin   . Breast cancer Cousin 63  . Breast cancer Cousin 27  . Colon cancer Cousin 62  . Pancreatic cancer Paternal Aunt 76  . Rectal cancer Paternal Aunt 23  . Heart attack Maternal Grandmother   . Heart attack Paternal Grandmother   . Stroke Paternal Grandfather   . Melanoma Daughter     dx in her 98s    The patient has two daughters, one has had melanoma in her 24's.  The patient has three brothers and two sisters, one sister was diagnosed with breast cancer at 61.  The patient's mother died of CHF at 57 and her father died of bone cancer, possibly multiple myeloma, at 82.  The patient's mother had two sisters and a brother who did not have cancer.  One sister had a daughter with breast cancer at 64.  There is no other family history of cancer on this side of the family.  The patient's father had three sisters.  One sister had pancreatic cancer and this sister had a daughter with breat cancer and a son with colon cancer at 69.  Another sister had rectal cancer 59.  Patient's maternal ancestors are of [place] descent, and paternal ancestors are of [place] descent.  There is no reported Ashkenazi Jewish ancestry. There is no known consanguinity.  GENETIC TEST RESULTS: Genetic testing reported out on April 29, 2016 through the Comprehensive cancer panel found no deleterious mutations.  The Comprehensive Cancer Panel offered by [facility] includes sequencing and/or deletion duplication testing of the following 32 genes: APC, ATM, AXIN2, BARD1, BMPR1A, BRCA1, BRCA2, BRIP1, CDH1, CDK4, CDKN2A, CHEK2, EPCAM, FANCC, MLH1, MSH2, MSH6, MUTYH, NBN, PALB2, PMS2, POLD1, POLE, PTEN, RAD51C, RAD51D, SCG5/GREM1, SMAD4, STK11, TP53, VHL, and XRCC2.   The test report has been scanned into EPIC and is located under the Molecular Pathology section of the Results Review tab.   We discussed with [name] that since the current genetic testing is not perfect, it is possible there may be a gene mutation in one of these genes that current testing cannot detect, but that chance is small. We also discussed, that it is possible that another gene that has not yet been discovered, or that we have not yet tested, is responsible for the cancer diagnoses in the family, and it is, therefore, important to remain in touch with cancer genetics in the future so that we can continue to offer [name] the most up to date genetic testing.   CANCER SCREENING RECOMMENDATIONS: This result is reassuring and indicates that [name] likely does not have an increased risk for a future cancer due to a mutation in one of these genes. This normal test also  suggests that [name]'s cancer was most likely not due to an inherited predisposition associated with one of these genes.  Most cancers happen by chance and this negative test suggests that her cancer falls into this category.  We, therefore, recommended she continue to follow the cancer management and screening guidelines provided by her oncology and primary healthcare provider.   RECOMMENDATIONS FOR FAMILY MEMBERS: Women in this family might be at some  increased risk of developing cancer, over the general population risk, simply due to the family history of cancer. We recommended women in this family have a yearly mammogram beginning at age 48, or 39 years younger than the earliest onset of cancer, an annual clinical breast exam, and perform monthly breast self-exams. Women in this family should also have a gynecological exam as recommended by their primary provider. All family members should have a colonoscopy by age 74.  FOLLOW-UP: Lastly, we discussed with [name] that cancer genetics is a rapidly advancing field and it is possible that new genetic tests will be appropriate for her and/or her family members in the future. We encouraged her to remain in contact with cancer genetics on an annual basis so we can update her personal and family histories and let her know of advances in cancer genetics that may benefit this family.   Our contact number was provided. [name] questions were answered to her satisfaction, and she knows she is welcome to call us at anytime with additional questions or concerns.   Alexis Kayser, MS, [facility] Certified Genetic Counselor [name]

## 2016-05-15 ENCOUNTER — Encounter (HOSPITAL_COMMUNITY)
Admission: RE | Disposition: A | Discharge status: Home / Self Care | Payer: Self-pay | Source: Ambulatory Visit | Attending: Internal Medicine

## 2016-05-15 ENCOUNTER — Encounter (HOSPITAL_COMMUNITY): Payer: Self-pay | Admitting: Internal Medicine

## 2016-05-15 ENCOUNTER — Ambulatory Visit (HOSPITAL_COMMUNITY)
Admission: RE | Admit: 2016-05-15 | Discharge: 2016-05-15 | Disposition: A | Discharge status: Home / Self Care | Payer: Medicare Other | Source: Ambulatory Visit | Attending: Internal Medicine | Admitting: Internal Medicine

## 2016-05-15 DIAGNOSIS — E039 Hypothyroidism, unspecified: Secondary | ICD-10-CM | POA: Insufficient documentation

## 2016-05-15 DIAGNOSIS — I495 Sick sinus syndrome: Secondary | ICD-10-CM

## 2016-05-15 DIAGNOSIS — Z808 Family history of malignant neoplasm of other organs or systems: Secondary | ICD-10-CM | POA: Insufficient documentation

## 2016-05-15 DIAGNOSIS — Z800 Family history of malignant neoplasm of digestive organs: Secondary | ICD-10-CM | POA: Insufficient documentation

## 2016-05-15 DIAGNOSIS — K219 Gastro-esophageal reflux disease without esophagitis: Secondary | ICD-10-CM | POA: Insufficient documentation

## 2016-05-15 DIAGNOSIS — Z882 Allergy status to sulfonamides status: Secondary | ICD-10-CM | POA: Diagnosis not present

## 2016-05-15 DIAGNOSIS — Z01812 Encounter for preprocedural laboratory examination: Secondary | ICD-10-CM

## 2016-05-15 DIAGNOSIS — Z950 Presence of cardiac pacemaker: Secondary | ICD-10-CM | POA: Diagnosis present

## 2016-05-15 DIAGNOSIS — C50412 Malignant neoplasm of upper-outer quadrant of left female breast: Secondary | ICD-10-CM | POA: Diagnosis not present

## 2016-05-15 DIAGNOSIS — I10 Essential (primary) hypertension: Secondary | ICD-10-CM | POA: Diagnosis not present

## 2016-05-15 DIAGNOSIS — J309 Allergic rhinitis, unspecified: Secondary | ICD-10-CM | POA: Diagnosis not present

## 2016-05-15 DIAGNOSIS — Z8711 Personal history of peptic ulcer disease: Secondary | ICD-10-CM | POA: Diagnosis not present

## 2016-05-15 DIAGNOSIS — Z880 Allergy status to penicillin: Secondary | ICD-10-CM | POA: Insufficient documentation

## 2016-05-15 DIAGNOSIS — E785 Hyperlipidemia, unspecified: Secondary | ICD-10-CM | POA: Diagnosis not present

## 2016-05-15 DIAGNOSIS — Z803 Family history of malignant neoplasm of breast: Secondary | ICD-10-CM | POA: Diagnosis not present

## 2016-05-15 DIAGNOSIS — Z45010 Encounter for checking and testing of cardiac pacemaker pulse generator [battery]: Secondary | ICD-10-CM | POA: Insufficient documentation

## 2016-05-15 DIAGNOSIS — Z85828 Personal history of other malignant neoplasm of skin: Secondary | ICD-10-CM | POA: Diagnosis not present

## 2016-05-15 HISTORY — PX: EP IMPLANTABLE DEVICE: SHX172B

## 2016-05-15 LAB — SURGICAL PCR SCREEN
MRSA, PCR: NEGATIVE
Staphylococcus aureus: NEGATIVE

## 2016-05-15 SURGERY — PPM GENERATOR CHANGEOUT

## 2016-05-15 MED ORDER — LIDOCAINE HCL (PF) 1 % IJ SOLN
INTRAMUSCULAR | Status: AC
  Filled 2016-05-15: qty 30

## 2016-05-15 MED ORDER — MUPIROCIN 2 % EX OINT
TOPICAL_OINTMENT | CUTANEOUS | Status: AC
  Filled 2016-05-15: qty 22

## 2016-05-15 MED ORDER — VANCOMYCIN HCL IN DEXTROSE 1-5 GM/200ML-% IV SOLN
INTRAVENOUS | Status: AC
  Filled 2016-05-15: qty 200

## 2016-05-15 MED ORDER — MIDAZOLAM HCL 5 MG/5ML IJ SOLN
INTRAMUSCULAR | Status: DC | PRN
  Administered 2016-05-15 (×6): 1 mg via INTRAVENOUS

## 2016-05-15 MED ORDER — FENTANYL CITRATE (PF) 100 MCG/2ML IJ SOLN
INTRAMUSCULAR | Status: DC | PRN
  Administered 2016-05-15: 12.5 ug via INTRAVENOUS
  Administered 2016-05-15: 25 ug via INTRAVENOUS
  Administered 2016-05-15 (×3): 12.5 ug via INTRAVENOUS

## 2016-05-15 MED ORDER — FENTANYL CITRATE (PF) 100 MCG/2ML IJ SOLN
25.0000 ug | Freq: Once | INTRAMUSCULAR | Status: AC
  Administered 2016-05-15: 25 ug via INTRAVENOUS

## 2016-05-15 MED ORDER — FENTANYL CITRATE (PF) 100 MCG/2ML IJ SOLN
INTRAMUSCULAR | Status: AC
  Filled 2016-05-15: qty 2

## 2016-05-15 MED ORDER — LIDOCAINE HCL (PF) 1 % IJ SOLN
INTRAMUSCULAR | Status: DC | PRN
  Administered 2016-05-15 (×2): 35 mL

## 2016-05-15 MED ORDER — ACETAMINOPHEN 325 MG PO TABS
ORAL_TABLET | ORAL | Status: AC
  Administered 2016-05-15: 650 mg via ORAL
  Filled 2016-05-15: qty 2

## 2016-05-15 MED ORDER — VANCOMYCIN HCL IN DEXTROSE 1-5 GM/200ML-% IV SOLN
1000.0000 mg | INTRAVENOUS | Status: AC
  Administered 2016-05-15: 1000 mg via INTRAVENOUS

## 2016-05-15 MED ORDER — ACETAMINOPHEN 325 MG PO TABS
325.0000 mg | ORAL_TABLET | ORAL | Status: DC | PRN
  Administered 2016-05-15: 650 mg via ORAL
  Filled 2016-05-15: qty 2

## 2016-05-15 MED ORDER — CHLORHEXIDINE GLUCONATE 4 % EX LIQD: 60.0000 mL | Freq: Once | CUTANEOUS | Status: DC

## 2016-05-15 MED ORDER — SODIUM CHLORIDE 0.9 % IR SOLN
Status: AC
  Filled 2016-05-15: qty 2

## 2016-05-15 MED ORDER — MIDAZOLAM HCL 5 MG/5ML IJ SOLN
INTRAMUSCULAR | Status: AC
  Filled 2016-05-15: qty 5

## 2016-05-15 MED ORDER — MUPIROCIN 2 % EX OINT
TOPICAL_OINTMENT | Freq: Once | CUTANEOUS | Status: DC
  Filled 2016-05-15: qty 22

## 2016-05-15 MED ORDER — HEPARIN (PORCINE) IN NACL 2-0.9 UNIT/ML-% IJ SOLN
INTRAMUSCULAR | Status: AC
  Filled 2016-05-15: qty 500

## 2016-05-15 MED ORDER — SODIUM CHLORIDE 0.9 % IV SOLN
INTRAVENOUS | Status: DC
  Administered 2016-05-15: 07:00:00 via INTRAVENOUS

## 2016-05-15 MED ORDER — ONDANSETRON HCL 4 MG/2ML IJ SOLN
INTRAMUSCULAR | Status: AC
  Filled 2016-05-15: qty 2

## 2016-05-15 MED ORDER — HEPARIN (PORCINE) IN NACL 2-0.9 UNIT/ML-% IJ SOLN
INTRAMUSCULAR | Status: DC | PRN
  Administered 2016-05-15: 500 mL

## 2016-05-15 MED ORDER — SODIUM CHLORIDE 0.9 % IR SOLN
80.0000 mg | Status: AC
  Administered 2016-05-15 (×2): 80 mg

## 2016-05-15 MED ORDER — ONDANSETRON HCL 4 MG/2ML IJ SOLN
4.0000 mg | Freq: Four times a day (QID) | INTRAMUSCULAR | Status: DC | PRN
  Administered 2016-05-15: 4 mg via INTRAVENOUS

## 2016-05-15 MED ORDER — FENTANYL CITRATE (PF) 100 MCG/2ML IJ SOLN
INTRAMUSCULAR | Status: AC
  Administered 2016-05-15: 25 ug via INTRAVENOUS
  Filled 2016-05-15: qty 2

## 2016-05-15 SURGICAL SUPPLY — 7 items
CABLE SURGICAL S-101-97-12 (CABLE) ×1 IMPLANT
DEVICE DISSECT PLASMABLAD 3.0S (MISCELLANEOUS) IMPLANT
PACEMAKER ADAPTA DR ADDRL1 (Pacemaker) IMPLANT
PAD DEFIB LIFELINK (PAD) ×1 IMPLANT
PLASMABLADE 3.0S (MISCELLANEOUS) ×2
PPM ADAPTA DR ADDRL1 (Pacemaker) ×2 IMPLANT
TRAY PACEMAKER INSERTION (PACKS) ×1 IMPLANT

## 2016-05-15 NOTE — H&P (Signed)
HPI  [name] is a 70 y.o. female Is seen at the request of radiation oncology to discuss repositioning of her pacemaker pulse generator from the left side to the right side move it out of the way of anticipated radiation therapy for breast cancer. She is scheduled for surgery with Dr. Lucia Gaskins of 06/06/16. ; Radiation therapy is to follow  Device was originally implanted about 15+ years ago and she underwent generator replacement in 2008. Her device is approaching ERI.  Myoview 8/16 demonstrated normal LV function and no ischemia         Past Medical History:  Diagnosis Date  . Allergic rhinitis   . Family history of breast cancer   . Family history of colon cancer   . Family history of pancreatic cancer   . GERD (gastroesophageal reflux disease)   . GI bleed   . History of basal cell cancer   . Hyperlipidemia   . Hypertension   . Hypothyroidism   . Lumbar disc disease   . Malignant neoplasm of upper-outer quadrant of left female breast ([place]) 04/11/2016  . Pacemaker   . Peptic ulcer disease   . SSS (sick sinus syndrome) ([place])   . Thyroid disease          Past Surgical History:  Procedure Laterality Date  . ABDOMINAL HYSTERECTOMY    . BREAST SURGERY    . HERNIA REPAIR    . PACEMAKER INSERTION            Current Outpatient Prescriptions  Medication Sig Dispense Refill  . albuterol (PROVENTIL HFA;VENTOLIN HFA) 108 (90 BASE) MCG/ACT inhaler Inhale into the lungs every 6 (six) hours as needed for wheezing or shortness of breath.    Marland Kitchen albuterol (PROVENTIL) (2.5 MG/3ML) 0.083% nebulizer solution Take 2.5 mg by nebulization every 6 (six) hours as needed for wheezing or shortness of breath.    Marland Kitchen atorvastatin (LIPITOR) 80 MG tablet Take 80 mg by mouth daily.    Marland Kitchen azelastine (ASTELIN) 137 MCG/SPRAY nasal spray Place 2 sprays into both nostrils 2 (two) times daily. Use in each nostril as directed    . DIHYDROERGOTAMINE MESYLATE NA Inject  2 mLs as directed as needed.     Marland Kitchen esomeprazole (NEXIUM) 40 MG capsule Take 40 mg by mouth 2 (two) times daily before a meal.    . fenofibrate 160 MG tablet Take 80 mg by mouth daily.    . Fish Oil OIL Take 2,000 mg by mouth daily.     Marland Kitchen levothyroxine (SYNTHROID, LEVOTHROID) 100 MCG tablet Take 100 mcg by mouth daily before breakfast.    . mometasone (NASONEX) 50 MCG/ACT nasal spray Place 2 sprays into the nose as needed.     . promethazine (PHENERGAN) 25 MG tablet Take 25 mg by mouth every 6 (six) hours as needed for nausea or vomiting.    . triamterene-hydrochlorothiazide (MAXZIDE) 75-50 MG tablet Take by mouth daily.    . Vitamin D, Cholecalciferol, 1000 UNITS TABS Take 1 tablet by mouth daily.      No current facility-administered medications for this visit.          Allergies  Allergen Reactions  . Nsaids Other (See Comments)    Upper GI bleed  . Penicillins Itching  . Bee Venom   . Sulfa Antibiotics     itching  . Wasp Venom       Review of Systems negative except from HPI and PMH  Physical Exam BP 130/86   Pulse  76   Ht 5\' 3"  (1.6 m)   Wt 157 lb 3.2 oz (71.3 kg)   SpO2 96%   BMI 27.85 kg/m  Well developed and well nourished in no acute distress HENT normal E scleral and icterus clear Neck Supple JVP flat; carotids brisk and full Clear to ausculation  Device pocket well healed; without hematoma or erythema.  There is no tethering Regular rate and rhythm, no murmurs gallops or rub Soft with active bowel sounds No clubbing cyanosis  Edema Alert and oriented, grossly normal motor and sensory function Skin Warm and Dry  ECG Apacing   Assessment and  Plan  Sinus node dysfunction  Breast Cancer   Pacemaker L sided with device approaching ERI  The patient has anticipated radiation therapy. Her device is approaching ERI. I have spoken with the radiation oncologist. Device will need to be moved. Radiographically there  appears to be possibly enough slack to allow the least moved to the right side without need for extenders. We have reviewed the benefits and risks of generator replacement.  These include but are not limited to lead fracture and infection.  The patient understands, agrees and is willing to proceed.      My schedule does not permit for number of weeks. Is Dr. Dr. Lovena Le to see if he get it done for more expeditiously.   Current medicines are reviewed at length with the patient today . The patient does not  haveconcerns regarding medicines.  EP Attending  Patient seen and examined. See above for details. The patient is a very pleasant 70 yo woman with sinus node dysfunction, s/p PPM insertion who has reached ERI. She has developed left sided breast CA and the radiation therapists have asked that the patient's left sided PPM be moved to the contralateral side of her chest. I have interviewed and examined the patient, discussed the indications, risk/benefit/goals and expectations of the procedure with the patient and she is willing to proceed.  [name].D.

## 2016-05-15 NOTE — Progress Notes (Signed)
Pt is awake with husband at bedside. 4mg  zofran given for report of nausea. Will transport to short stay when symptoms improve.

## 2016-05-15 NOTE — Discharge Instructions (Signed)
Pacemaker Battery Change, Care After Keep incision clean and dry for 10 days. No driving for 2 days.  You can remove outer dressing tomorrow. Leave steri-strips (little pieces of tape) on until seen in the office for wound check appointment. Call the office [phone]) for redness, drainage, swelling, or fever.  Refer to this sheet in the next few weeks. These instructions provide you with information on caring for yourself after your procedure. Your health care provider may also give you more specific instructions. Your treatment has been planned according to current medical practices, but problems sometimes occur. Call your health care provider if you have any problems or questions after your procedure. WHAT TO EXPECT AFTER THE PROCEDURE After your procedure, it is typical to have the following sensations:  Soreness at the pacemaker site. HOME CARE INSTRUCTIONS   Keep the incision clean and dry.  Unless advised otherwise, you may shower beginning 48 hours after your procedure.  For the first week after the replacement, avoid stretching motions that pull at the incision site, and avoid heavy exercise with the arm that is on the same side as the incision.  Take medicines only as directed by your health care provider.  Keep all follow-up visits as directed by your health care provider. SEEK MEDICAL CARE IF:   You have pain at the incision site that is not relieved by over-the-counter or prescription medicine.  There is drainage or pus from the incision site.  There is swelling larger than a lime at the incision site.  You develop red streaking that extends above or below the incision site.  You feel brief, intermittent palpitations, light-headedness, or any symptoms that you feel might be related to your heart. SEEK IMMEDIATE MEDICAL CARE IF:   You experience chest pain that is different than the pain at the pacemaker site.  You experience shortness of breath.  You have  palpitations or irregular heartbeat.  You have light-headedness that does not go away quickly.  You faint.  You have pain that gets worse and is not relieved by medicine. This information is not intended to replace advice given to you by your health care provider. Make sure you discuss any questions you have with your health care provider. Document Released: 03/09/2013 Document Revised: 06/09/2014 Document Reviewed: 03/09/2013 Elsevier Interactive Patient Education  2017 [place] incision clean and dry for 10 days. No driving for 2 days.  You can remove outer dressing tomorrow. Leave steri-strips (little pieces of tape) on until seen in the office for wound check appointment. Call the office [phone]) for redness, drainage, swelling, or fever.

## 2016-05-16 ENCOUNTER — Ambulatory Visit: Payer: Medicare Other | Admitting: Oncology

## 2016-05-16 ENCOUNTER — Telehealth: Payer: Self-pay | Admitting: Internal Medicine

## 2016-05-16 MED FILL — Sodium Chloride Irrigation Soln 0.9%: Qty: 500 | Status: AC

## 2016-05-16 MED FILL — Gentamicin Sulfate Inj 40 MG/ML: INTRAMUSCULAR | Qty: 2 | Status: AC

## 2016-05-16 MED FILL — Vancomycin HCl-Dextrose IV Soln 1 GM/200ML-5%: INTRAVENOUS | Qty: 200 | Status: AC

## 2016-05-16 NOTE — Telephone Encounter (Signed)
New Message  Pt voiced wanting nurse to return her call.  Please f/u with pt

## 2016-05-16 NOTE — Telephone Encounter (Signed)
I called and spoke with the patient. She states her husband thinks she was told yesterday at her generator changeout not to work for 1 week. 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 Reviewed with Dr. Caryl Comes- ok to RTW on Monday- I have made the patient aware of this, but for light duty. She voices understanding.

## 2016-05-28 ENCOUNTER — Ambulatory Visit (INDEPENDENT_AMBULATORY_CARE_PROVIDER_SITE_OTHER): Payer: Medicare Other | Admitting: *Deleted

## 2016-05-28 DIAGNOSIS — I495 Sick sinus syndrome: Secondary | ICD-10-CM | POA: Diagnosis not present

## 2016-05-28 NOTE — Progress Notes (Signed)
Wound check appointment. Steri-strips removed from right and left incisions. Right and left wounds without redness or edema. Right and left incision edges approximated, wounds well healed. Normal device function. Thresholds, sensing, and impedances consistent with implant measurements. Device programmed at chronic outputs w/ adaptive on. Histogram distribution appropriate for patient and level of activity. No mode switches or high ventricular rates noted. Patient educated about wound care, arm mobility. ROV 08/27/2016 w/ GT

## 2016-05-29 NOTE — Pre-Procedure Instructions (Signed)
[name] [facility]  05/29/2016      [facility] [address] - [place], [place] - [address] [place] [address] Phone: [phone] Fax: [phone]    Your procedure is scheduled on Fri, Jan 5 @ 10:00 AM  Report to [facility] Admitting at 8:00 AM  Call this number if you have problems the morning of surgery:  [phone]   Remember:  Do not eat food or drink liquids after midnight.  Take these medicines the morning of surgery with A SIP OF WATER Albuterol<Bring Your Inhaler With You>,Albuterol Neb(if needed),Astelin Nasal Spray(if needed),Synthroid(Levothyroxine),Nasonex(Mometasone-if needed),Omeprazole(Prilosec),and Phenergan(Promethazine-if needed)             Stop taking your Fish Oil along with any Vitamins or Herbal Medications. No Goody's,BC's,Aleve,Advil,Motrin, or Ibuprofen    Do not wear jewelry, make-up or nail polish.  Do not wear lotions, powders,perfumes, or deoderant.  Do not shave 48 hours prior to surgery.    Do not bring valuables to the hospital.  [facility] is not responsible for any belongings or valuables.  Contacts, dentures or bridgework may not be worn into surgery.  Leave your suitcase in the car.  After surgery it may be brought to your room.  For patients admitted to the hospital, discharge time will be determined by your treatment team.  Patients discharged the day of surgery will not be allowed to drive home.    [facility]  Before surgery, you can play an important role.  Because skin is not sterile, your skin needs to be as free of germs as possible.  You can reduce the number of germs on you skin by washing with CHG (chlorahexidine gluconate) soap before surgery.  CHG is an antiseptic cleaner which kills germs and bonds with the skin to continue killing germs even after washing.  Please DO NOT use if you have an allergy to CHG or antibacterial soaps.  If  your skin becomes reddened/irritated stop using the CHG and inform your nurse when you arrive at Short Stay.  Do not shave (including legs and underarms) for at least 48 hours prior to the first CHG shower.  You may shave your face.  Please follow these instructions carefully:   1.  Shower with CHG Soap the night before surgery and the                                morning of Surgery.  2.  If you choose to wash your hair, wash your hair first as usual with your       normal shampoo.  3.  After you shampoo, rinse your hair and body thoroughly to remove the                      Shampoo.  4.  Use CHG as you would any other liquid soap.  You can apply chg directly       to the skin and wash gently with scrungie or a clean washcloth.  5.  Apply the CHG Soap to your body ONLY FROM THE NECK DOWN.        Do not use on open wounds or open sores.  Avoid contact with your eyes,       ears, mouth and genitals (private parts).  Wash genitals (private parts)       with  your normal soap.  6.  Wash thoroughly, paying special attention to the area where your surgery        will be performed.  7.  Thoroughly rinse your body with warm water from the neck down.  8.  DO NOT shower/wash with your normal soap after using and rinsing off       the CHG Soap.  9.  Pat yourself dry with a clean towel.            10.  Wear clean pajamas.            11.  Place clean sheets on your bed the night of your first shower and do not        sleep with pets.  Day of Surgery  Do not apply any lotions/deoderants the morning of surgery.  Please wear clean clothes to the hospital/surgery center.    Please read over the following fact sheets that you were given. Pain Booklet, Coughing and Deep Breathing and Surgical Site Infection Prevention

## 2016-05-30 ENCOUNTER — Encounter (HOSPITAL_COMMUNITY): Payer: Self-pay

## 2016-05-30 ENCOUNTER — Telehealth: Payer: Self-pay | Admitting: Internal Medicine

## 2016-05-30 ENCOUNTER — Encounter (HOSPITAL_COMMUNITY)
Admission: RE | Admit: 2016-05-30 | Discharge: 2016-05-30 | Disposition: A | Discharge status: Home / Self Care | Payer: Medicare Other | Source: Ambulatory Visit | Attending: Surgery | Admitting: Surgery

## 2016-05-30 DIAGNOSIS — C50912 Malignant neoplasm of unspecified site of left female breast: Secondary | ICD-10-CM | POA: Diagnosis not present

## 2016-05-30 DIAGNOSIS — Z01812 Encounter for preprocedural laboratory examination: Secondary | ICD-10-CM | POA: Diagnosis present

## 2016-05-30 HISTORY — DX: Unspecified osteoarthritis, unspecified site: M19.90

## 2016-05-30 LAB — BASIC METABOLIC PANEL
ANION GAP: 7 (ref 5–15)
BUN: 21 mg/dL — ABNORMAL HIGH (ref 6–20)
CALCIUM: 9.5 mg/dL (ref 8.9–10.3)
CO2: 25 mmol/L (ref 22–32)
Chloride: 106 mmol/L (ref 101–111)
Creatinine, Ser: 0.75 mg/dL (ref 0.44–1.00)
GFR calc non Af Amer: >60 mL/min (ref >60)
Glucose, Bld: 84 mg/dL (ref 65–99)
POTASSIUM: 3.9 mmol/L (ref 3.5–5.1)
SODIUM: 138 mmol/L (ref 135–145)

## 2016-05-30 LAB — CBC
HCT: 40.1 % (ref 36.0–46.0)
Hemoglobin: 13.6 g/dL (ref 12.0–15.0)
MCH: 29.2 pg (ref 26.0–34.0)
MCHC: 33.9 g/dL (ref 30.0–36.0)
MCV: 86.1 fL (ref 78.0–100.0)
PLATELETS: 334 10*3/uL (ref 150–400)
RBC: 4.66 MIL/uL (ref 3.87–5.11)
RDW: 13.1 % (ref 11.5–15.5)
WBC: 5.2 10*3/uL (ref 4.0–10.5)

## 2016-05-30 NOTE — Telephone Encounter (Signed)
Follow Up:    Pt is following up her cardiac clearance letter. She was just notified a few minutes ago by her surgeon office,that no fax have been received. They need this asap,implant will be put in on Wednesday. Please fax to [phone]. Dr Alphonsa Overall is the surgeon at [phone] any questions ask for [facility]. Pt would like for you to let her know if this is taken care of please.

## 2016-05-30 NOTE — Telephone Encounter (Signed)
Called Dr. Pollie Friar office and talked to [place]. Informed them that patient has surgical clearance from her office visit with Lyda Jester PA on 05/01/16.  "4. Pre-operative Assessment: Pt with no known h/o coronary disease. She had a NST a little over 1 year ago, 01/2015 that was low risk and negative for ischemia with normal EF. She denies any anginal symptomatology. She is able to ambuate a flight of stairs (4 + Mets of physical activity) w/o exertional chest pain or dyspnea. No EKG changes. We are awaiting PPM device interrogation by the EP staff later today. If normal device function, she can be cleared for surgery"   Patient was seen on 05/01/16 by Dr. Caryl Comes as well. In his note he stated-  "The patient has anticipated radiation therapy. Her device is approaching ERI. I have spoken with the radiation oncologist. Device will need to be moved. Radiographically there appears to be possibly enough slack to allow the least moved to the right side without need for extenders. We have reviewed the benefits and risks of generator replacement.  These include but are not limited to lead fracture and infection.  The patient understands, agrees and is willing to proceed."      Dr. Lovena Le removed and implanted new device. Here is the conclusion on the procedure done on 05/15/16.  "Conclusion: Successful removal of a previously implanted dual-chamber pacemaker which had reached elective replacement, with insertion of a new dual-chamber pacemaker and relocation of the pacemaker pocket from the left side to the right side in a patient with sinus node dysfunction who had reached elective replacement who also had left-sided breast cancer in need of radiation therapy.   Cristopher Peru, M.D.  Patient had post op visit on 05/28/16 with the device clinic which stated -  Wanda Plump, RN at 05/28/2016 10:30 AM   Status: Signed    Wound check appointment. Steri-strips removed from right and left  incisions. Right and left wounds without redness or edema. Right and left incision edges approximated, wounds well healed. Normal device function. Thresholds, sensing, and impedances consistent with implant measurements. Device programmed at chronic outputs w/ adaptive on. Histogram distribution appropriate for patient and level of activity. No mode switches or high ventricular rates noted. Patient educated about wound care, arm mobility. ROV 08/27/2016 w/ GT     Faxed office visit note from 05/01/16 with Lyda Jester PA and post op visit with device clinic on 05/28/16 to Dr. Pollie Friar office. Janett Billow can also pull up notes in epic, but I have consolidated notes that are relevant to this surgical clearance. If there are any other questions or concerns, their office will give [place] a call.

## 2016-06-03 NOTE — Telephone Encounter (Signed)
Received incoming call. Pt wants to know if clearance was faxed to Dr. Pollie Friar office. Informed it was faxed over on 05/30/16 at 4:37 PM. Informed I will call Dr. Pollie Friar office and f/u with pt when I hear back.   Called Jessica at Dr. Pollie Friar office. Left VM. Requested call back.

## 2016-06-04 ENCOUNTER — Encounter: Payer: Medicare Other | Admitting: Internal Medicine

## 2016-06-04 DIAGNOSIS — C50412 Malignant neoplasm of upper-outer quadrant of left female breast: Secondary | ICD-10-CM | POA: Diagnosis not present

## 2016-06-05 NOTE — Telephone Encounter (Signed)
Separate fax for request for clearance received on 12/29 for Dr. Caryl Comes. He did sign this and cleared patient for her surgery. This was also faxed this morning to [facility] @ [phone]. Confirmation received.

## 2016-06-05 NOTE — H&P (Signed)
[name] [facility]  Location: [facility] Patient #: [id number] DOB: Jun 01, 1946 Undefined / Language: Cleophus Molt / Race: White Female  History of Present Illness   The patient is a 71 year old female who presents with a complaint of Breast cancer at [facility] clinic.   The PCP is Dr. Raliegh Ip. Rex Kras.  The patient was referred by Dr. Raliegh Ip. Rex Kras.  She is at the [facility] clinic - Oncology Drs. Magrinat and Kinard.  Her daughter, Noel Christmas, is with her today.  She is a patient of [facility]. She has had a prior needle biopsy of her left breast. This was benign. She said she had a breast biopsy about 5 years ago. She underwent a left breast biopsy on 04/09/2016. 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  I discussed the options for breast cancer treatment with the patient. She is at the Breast multidisciplinary conference, which includes medical oncology and radiation oncology. I discussed the surgical options of lumpectomy vs. mastectomy. If mastectomy, there is the possibility of reconstruction. I discussed the options of lymph node biopsy. The treatment plan depends on the pathologic staging of the tumor and the patient's personal wishes. The risks of surgery include, but are not limited to, bleeding, infection, the need for further surgery, and nerve injury. The patient has been given literature on the treatment of breast cancer.  Plan: 1) Genetics, wants to wait until genetics are back before proceeding with surgery. So looking at week of 12/18 or week of 12/25, 2) Caridac clearance, 3) left breast lumpectomy (seed loc) and SLNBx, 4) Final treatment based on size of breast cancer ... since it is ER neg and Her2Neu positive - the push is to treat her (would have to move pacemaker for rad tx)  Past Medical History: 1. 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 Linard Millers. She had a stress test on  01/05/2015 - which showed a 55% EF with low risk study She had the pacemaker moved from the left side to the right side on 05/15/2016 by Dr. Lovena Le 2. Back issues Went through [facility], TENS unit, and back brace, but now things are better and she is not seeing anyone. 3. Ulcer 2002 - Dr. Amedeo Plenty She was supposed to get a colonoscopy today, but that was cancelled. 4. hysterectomy - 1981 5. Hiatal hernia repair by Dr. Leafy Kindle - 1990's 6. Migraine headaches Went to Headache clinic at one time. Migrains are better now 7. Arthritis of hands, arms, and back 8. Right inguinal hernia by Dr. Hulen Skains - 2001 (returned 6 weeks post op for nerve entrapment)  Social History: Married, but husband is in poor health. Her daughter, Noel Christmas, is with her today. She is a Pharmacist, hospital in [place], [place]. Other daughter, [name], is not with her today. She is a Pharmacist, hospital in [place].   Other Problems Tawni Pummel, RN; 04/16/2016 7:53 AM) Arthritis  Asthma  Back Pain  Breast Cancer  Chest pain  Gastric Ulcer  Gastroesophageal Reflux Disease  High blood pressure  Hypercholesterolemia  Inguinal Hernia  Lump In Breast  Melanoma  Migraine Headache  Thyroid Disease  Transfusion history   Past Surgical History Tawni Pummel, RN; 04/16/2016 7:53 AM) Breast Biopsy  Left. multiple Cataract Surgery  Bilateral. Colon Polyp Removal - Colonoscopy  Hysterectomy (not due to cancer) - Partial  Laparoscopic Inguinal Hernia Surgery  Right. Oral Surgery  Tonsillectomy  Ventral / Umbilical Hernia Surgery  Bilateral.  Diagnostic Studies  History Tawni Pummel, RN; 04/16/2016 7:53 AM) Colonoscopy  >10 years ago Mammogram  within last year Pap Smear  >5 years ago  Medication History Tawni Pummel, RN; 04/16/2016 7:53 AM) Medications Reconciled  Social History Tawni Pummel, RN; 04/16/2016 7:53 AM) No alcohol use  No caffeine use   No drug use  Tobacco use  Never smoker.  Family History Tawni Pummel, RN; 04/16/2016 7:53 AM) Alcohol Abuse  Father. Arthritis  Brother, Mother, Sister. Breast Cancer  Family Members In General, Sister. Cerebrovascular Accident  Father. Heart Disease  Family Members In General, Mother. Hypertension  Brother, Family Members In [place], Father, Mother, Sister. Migraine Headache  Family Members In General, Father. Respiratory Condition  Family Members In General.  Pregnancy / Birth History Tawni Pummel, RN; 04/16/2016 7:53 AM) Age at menarche  38 years. Age of menopause  <44 Contraceptive History  Intrauterine device. Gravida  2 Irregular periods  Length (months) of breastfeeding  7-12 Maternal age  59-20 Para  2    Review of Systems Tawni Pummel RN; 04/16/2016 7:53 AM) General Present- Appetite Loss and Fatigue. Not Present- Chills, Fever, Night Sweats, Weight Gain and Weight Loss. Skin Present- Dryness. Not Present- Change in Wart/Mole, Hives, Jaundice, New Lesions, Non-Healing Wounds, Rash and Ulcer. HEENT Present- Seasonal Allergies, Sinus Pain, Sore Throat and Wears glasses/contact lenses. Not Present- Earache, Hearing Loss, Hoarseness, Nose Bleed, Oral Ulcers, Ringing in the Ears, Visual Disturbances and Yellow Eyes. Breast Present- Breast Pain. Not Present- Breast Mass, Nipple Discharge and Skin Changes. Gastrointestinal Present- Change in Bowel Habits, Constipation, Gets full quickly at meals and Indigestion. Not Present- Abdominal Pain, Bloating, Bloody Stool, Chronic diarrhea, Difficulty Swallowing, Excessive gas, Hemorrhoids, Nausea, Rectal Pain and Vomiting. Female Genitourinary Present- Frequency and Urgency. Not Present- Nocturia, Painful Urination and Pelvic Pain. Musculoskeletal Present- Back Pain, Joint Pain and Joint Stiffness. Not Present- Muscle Pain, Muscle Weakness and Swelling of Extremities. Neurological Present- Headaches. Not  Present- Decreased Memory, Fainting, Numbness, Seizures, Tingling, Tremor, Trouble walking and Weakness. Psychiatric Present- Anxiety. Not Present- Bipolar, Change in Sleep Pattern, Depression, Fearful and Frequent crying. Endocrine Not Present- Cold Intolerance, Excessive Hunger, Hair Changes, Heat Intolerance, Hot flashes and New Diabetes. Hematology Present- Easy Bruising. Not Present- Blood Thinners, Excessive bleeding, Gland problems, HIV and Persistent Infections.   Physical Exam  General: Older WFalert and generally healthy appearing. Skin: Inspection and palpation of the skin unremarkable.  Eyes: Conjunctivae white, pupils equal. Face, ears, nose, mouth, and throat: Face - normal. Normal ears and nose. Lips and teeth normal.  Neck: Supple. No mass. Trachea midline. No thyroid mass.  Lymph Nodes: No supraclavicular or cervical adenopathy. No axillary adenopathy.  Lungs: Normal respiratory effort. Clear to auscultation and symmetric breath sounds. Cardiovascular: Regular rate and rythm. Normal auscultation of the heart. No murmur or rub. Normal carotid pulse.  Breasts: Right - No mass or nodule Left - Pacemaker in UOQ of left breast. She has a bruise in the UOQ and LOQ of the left breast. I do not feel a mass.  Abdomen: Soft. No mass. Liver and spleen not palpable. No tenderness. No hernia. Normal bowel sounds. Upper midline and RLQ scars.  Musculoskeletal/extremities: Normal gait. Good strength and ROM in upper and lower extremities.   Neurologic: Grossly intact to motor and sensory function.   Psychiatric: Has normal mood and affect. Judgement and insight appear normal.  Assessment & Plan  1.  MALIGNANT NEOPLASM OF LEFT BREAST, STAGE 1, ESTROGEN RECEPTOR NEGATIVE (C50.912)  Story: She underwent a left  breast biopsy at [facility] on 04/09/2016. 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  Her  treating Oncologist are Drs. Magrinat and Kinard.  Plan:   1)  Results per Roma Kayser, 05/09/2016 - no deleterious mutations.   2) she had her pacemaker moved on 05/15/2016 by Dr. Lovena Le  3) left breast lumpectomy (seed loc) and SLNBx,   4) Final treatment based on size of breast cancer ... since it is ER neg and Her2Neu positive  2.  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 Linard Millers and Dr. Olin Pia  She had the pacemaker moved from the left side to the right side on 05/15/2016 by Dr. Lovena Le  3. Back issues Went through [facility], TENS unit, and back brace, but now things are better and she is not seeing anyone. 4. Ulcer 2002 - Dr. Amedeo Plenty She was supposed to get a colonoscopy today, but that was cancelled. 5. Hiatal hernia repair by Dr. Leafy Kindle - 1990's 6. Migraine headaches Went to Headache clinic at one time. Migrains are better now 7. Arthritis of hands, arms, and back 8. Right inguinal hernia by Dr. Hulen Skains - 2001 (returned 6 weeks post op for nerve entrapment)   Alphonsa Overall, MD, [facility] Surgery Pager: [phone] Office phone:  [phone]

## 2016-06-06 ENCOUNTER — Ambulatory Visit (HOSPITAL_COMMUNITY): Payer: Medicare HMO | Admitting: Anesthesiology

## 2016-06-06 ENCOUNTER — Encounter (HOSPITAL_COMMUNITY)
Admission: RE | Admit: 2016-06-06 | Discharge: 2016-06-06 | Disposition: A | Discharge status: Home / Self Care | Payer: Medicare HMO | Source: Ambulatory Visit | Attending: Surgery | Admitting: Surgery

## 2016-06-06 ENCOUNTER — Encounter (HOSPITAL_COMMUNITY): Payer: Self-pay | Admitting: Anesthesiology

## 2016-06-06 ENCOUNTER — Encounter (HOSPITAL_COMMUNITY)
Admission: RE | Disposition: A | Discharge status: Home / Self Care | Payer: Self-pay | Source: Ambulatory Visit | Attending: Surgery

## 2016-06-06 ENCOUNTER — Ambulatory Visit (HOSPITAL_COMMUNITY)
Admission: RE | Admit: 2016-06-06 | Discharge: 2016-06-06 | Disposition: A | Discharge status: Home / Self Care | Payer: Medicare HMO | Source: Ambulatory Visit | Attending: Surgery | Admitting: Surgery

## 2016-06-06 DIAGNOSIS — Z90710 Acquired absence of both cervix and uterus: Secondary | ICD-10-CM | POA: Diagnosis not present

## 2016-06-06 DIAGNOSIS — M1990 Unspecified osteoarthritis, unspecified site: Secondary | ICD-10-CM | POA: Diagnosis not present

## 2016-06-06 DIAGNOSIS — Z86010 Personal history of colonic polyps: Secondary | ICD-10-CM | POA: Diagnosis not present

## 2016-06-06 DIAGNOSIS — C50912 Malignant neoplasm of unspecified site of left female breast: Secondary | ICD-10-CM | POA: Insufficient documentation

## 2016-06-06 DIAGNOSIS — Z8719 Personal history of other diseases of the digestive system: Secondary | ICD-10-CM | POA: Insufficient documentation

## 2016-06-06 DIAGNOSIS — M19041 Primary osteoarthritis, right hand: Secondary | ICD-10-CM | POA: Diagnosis not present

## 2016-06-06 DIAGNOSIS — M479 Spondylosis, unspecified: Secondary | ICD-10-CM | POA: Insufficient documentation

## 2016-06-06 DIAGNOSIS — G8918 Other acute postprocedural pain: Secondary | ICD-10-CM | POA: Diagnosis not present

## 2016-06-06 DIAGNOSIS — J45909 Unspecified asthma, uncomplicated: Secondary | ICD-10-CM | POA: Insufficient documentation

## 2016-06-06 DIAGNOSIS — Z171 Estrogen receptor negative status [ER-]: Secondary | ICD-10-CM | POA: Diagnosis not present

## 2016-06-06 DIAGNOSIS — I10 Essential (primary) hypertension: Secondary | ICD-10-CM | POA: Insufficient documentation

## 2016-06-06 DIAGNOSIS — E7800 Pure hypercholesterolemia, unspecified: Secondary | ICD-10-CM | POA: Diagnosis not present

## 2016-06-06 DIAGNOSIS — Z950 Presence of cardiac pacemaker: Secondary | ICD-10-CM | POA: Insufficient documentation

## 2016-06-06 DIAGNOSIS — G43909 Migraine, unspecified, not intractable, without status migrainosus: Secondary | ICD-10-CM | POA: Diagnosis not present

## 2016-06-06 DIAGNOSIS — M19042 Primary osteoarthritis, left hand: Secondary | ICD-10-CM | POA: Insufficient documentation

## 2016-06-06 DIAGNOSIS — E039 Hypothyroidism, unspecified: Secondary | ICD-10-CM | POA: Diagnosis not present

## 2016-06-06 DIAGNOSIS — K219 Gastro-esophageal reflux disease without esophagitis: Secondary | ICD-10-CM | POA: Diagnosis not present

## 2016-06-06 HISTORY — PX: BREAST LUMPECTOMY WITH RADIOACTIVE SEED AND SENTINEL LYMPH NODE BIOPSY: SHX6550

## 2016-06-06 SURGERY — BREAST LUMPECTOMY WITH RADIOACTIVE SEED AND SENTINEL LYMPH NODE BIOPSY
Anesthesia: General | Site: Breast | Laterality: Left

## 2016-06-06 MED ORDER — LIDOCAINE HCL (CARDIAC) 20 MG/ML IV SOLN
INTRAVENOUS | Status: DC | PRN
  Administered 2016-06-06: 50 mg via INTRAVENOUS

## 2016-06-06 MED ORDER — FENTANYL CITRATE (PF) 100 MCG/2ML IJ SOLN
INTRAMUSCULAR | Status: AC
  Filled 2016-06-06: qty 4

## 2016-06-06 MED ORDER — BUPIVACAINE-EPINEPHRINE (PF) 0.5% -1:200000 IJ SOLN
INTRAMUSCULAR | Status: DC | PRN
  Administered 2016-06-06: 30 mL via PERINEURAL

## 2016-06-06 MED ORDER — ONDANSETRON HCL 4 MG/2ML IJ SOLN
4.0000 mg | Freq: Once | INTRAMUSCULAR | Status: AC
  Administered 2016-06-06: 4 mg via INTRAVENOUS

## 2016-06-06 MED ORDER — BUPIVACAINE HCL (PF) 0.25 % IJ SOLN
INTRAMUSCULAR | Status: AC
  Filled 2016-06-06: qty 30

## 2016-06-06 MED ORDER — CEFAZOLIN SODIUM-DEXTROSE 2-4 GM/100ML-% IV SOLN
2.0000 g | INTRAVENOUS | Status: AC
  Administered 2016-06-06: 2 g via INTRAVENOUS

## 2016-06-06 MED ORDER — TECHNETIUM TC 99M SULFUR COLLOID FILTERED: 1.0000 | Freq: Once | INTRAVENOUS | Status: DC | PRN

## 2016-06-06 MED ORDER — FENTANYL CITRATE (PF) 100 MCG/2ML IJ SOLN: 25.0000 ug | INTRAMUSCULAR | Status: DC | PRN

## 2016-06-06 MED ORDER — PHENYLEPHRINE HCL 10 MG/ML IJ SOLN
INTRAMUSCULAR | Status: DC | PRN
  Administered 2016-06-06 (×2): 80 ug via INTRAVENOUS

## 2016-06-06 MED ORDER — GABAPENTIN 300 MG PO CAPS
300.0000 mg | ORAL_CAPSULE | ORAL | Status: AC
  Administered 2016-06-06: 300 mg via ORAL
  Filled 2016-06-06: qty 1

## 2016-06-06 MED ORDER — FENTANYL CITRATE (PF) 100 MCG/2ML IJ SOLN
INTRAMUSCULAR | Status: DC | PRN
  Administered 2016-06-06: 50 ug via INTRAVENOUS

## 2016-06-06 MED ORDER — PROPOFOL 10 MG/ML IV BOLUS
INTRAVENOUS | Status: DC | PRN
  Administered 2016-06-06: 100 mg via INTRAVENOUS

## 2016-06-06 MED ORDER — CEFAZOLIN SODIUM-DEXTROSE 2-4 GM/100ML-% IV SOLN
INTRAVENOUS | Status: AC
  Filled 2016-06-06: qty 100

## 2016-06-06 MED ORDER — FENTANYL CITRATE (PF) 100 MCG/2ML IJ SOLN
INTRAMUSCULAR | Status: AC
  Administered 2016-06-06: 50 ug
  Filled 2016-06-06: qty 2

## 2016-06-06 MED ORDER — CHLORHEXIDINE GLUCONATE CLOTH 2 % EX PADS: 6.0000 | MEDICATED_PAD | Freq: Once | CUTANEOUS | Status: DC

## 2016-06-06 MED ORDER — OXYCODONE HCL 5 MG PO TABS: 5.0000 mg | ORAL_TABLET | Freq: Once | ORAL | Status: DC | PRN

## 2016-06-06 MED ORDER — METOCLOPRAMIDE HCL 5 MG/ML IJ SOLN
INTRAMUSCULAR | Status: AC
  Administered 2016-06-06: 10 mg via INTRAVENOUS
  Filled 2016-06-06: qty 2

## 2016-06-06 MED ORDER — PROPOFOL 10 MG/ML IV BOLUS
INTRAVENOUS | Status: AC
  Filled 2016-06-06: qty 20

## 2016-06-06 MED ORDER — PHENYLEPHRINE HCL 10 MG/ML IJ SOLN
INTRAVENOUS | Status: DC | PRN
  Administered 2016-06-06: 50 ug/min via INTRAVENOUS

## 2016-06-06 MED ORDER — ONDANSETRON HCL 4 MG/2ML IJ SOLN
INTRAMUSCULAR | Status: AC
  Administered 2016-06-06: 4 mg via INTRAVENOUS
  Filled 2016-06-06: qty 2

## 2016-06-06 MED ORDER — FENTANYL CITRATE (PF) 100 MCG/2ML IJ SOLN
50.0000 ug | Freq: Once | INTRAMUSCULAR | Status: AC
  Administered 2016-06-06: 50 ug via INTRAVENOUS

## 2016-06-06 MED ORDER — OXYCODONE HCL 5 MG/5ML PO SOLN: 5.0000 mg | Freq: Once | ORAL | Status: DC | PRN

## 2016-06-06 MED ORDER — METHYLENE BLUE 0.5 % INJ SOLN
INTRAVENOUS | Status: AC
  Filled 2016-06-06: qty 10

## 2016-06-06 MED ORDER — HYDROCODONE-ACETAMINOPHEN 5-325 MG PO TABS: 1.0000 | ORAL_TABLET | Freq: Four times a day (QID) | ORAL | 0 refills | Status: DC | PRN

## 2016-06-06 MED ORDER — ONDANSETRON HCL 4 MG/2ML IJ SOLN: 4.0000 mg | Freq: Once | INTRAMUSCULAR | Status: DC | PRN

## 2016-06-06 MED ORDER — BUPIVACAINE-EPINEPHRINE 0.25% -1:200000 IJ SOLN
INTRAMUSCULAR | Status: DC | PRN
  Administered 2016-06-06: 20 mL

## 2016-06-06 MED ORDER — METOCLOPRAMIDE HCL 5 MG/ML IJ SOLN
10.0000 mg | Freq: Once | INTRAMUSCULAR | Status: AC
  Administered 2016-06-06: 10 mg via INTRAVENOUS

## 2016-06-06 MED ORDER — SODIUM CHLORIDE 0.9 % IJ SOLN
INTRAMUSCULAR | Status: AC
  Filled 2016-06-06: qty 10

## 2016-06-06 MED ORDER — FENTANYL CITRATE (PF) 100 MCG/2ML IJ SOLN: 50.0000 ug | Freq: Once | INTRAMUSCULAR | Status: DC

## 2016-06-06 MED ORDER — LACTATED RINGERS IV SOLN
INTRAVENOUS | Status: DC
  Administered 2016-06-06 (×3): via INTRAVENOUS

## 2016-06-06 MED ORDER — 0.9 % SODIUM CHLORIDE (POUR BTL) OPTIME
TOPICAL | Status: DC | PRN
  Administered 2016-06-06: 1000 mL

## 2016-06-06 MED ORDER — ACETAMINOPHEN 500 MG PO TABS
1000.0000 mg | ORAL_TABLET | ORAL | Status: AC
  Administered 2016-06-06: 1000 mg via ORAL
  Filled 2016-06-06: qty 2

## 2016-06-06 MED ORDER — FENTANYL CITRATE (PF) 100 MCG/2ML IJ SOLN
INTRAMUSCULAR | Status: AC
  Filled 2016-06-06: qty 2

## 2016-06-06 MED ORDER — MIDAZOLAM HCL 2 MG/2ML IJ SOLN
1.0000 mg | Freq: Once | INTRAMUSCULAR | Status: AC
  Administered 2016-06-06: 1 mg via INTRAVENOUS

## 2016-06-06 MED ORDER — MIDAZOLAM HCL 2 MG/2ML IJ SOLN
INTRAMUSCULAR | Status: AC
  Filled 2016-06-06: qty 2

## 2016-06-06 SURGICAL SUPPLY — 49 items
ADH SKN CLS APL DERMABOND .7 (GAUZE/BANDAGES/DRESSINGS) ×1
BINDER BREAST LRG (GAUZE/BANDAGES/DRESSINGS) IMPLANT
BINDER BREAST XLRG (GAUZE/BANDAGES/DRESSINGS) IMPLANT
BLADE SURG 15 STRL LF DISP TIS (BLADE) ×1 IMPLANT
BLADE SURG 15 STRL SS (BLADE) ×3
CANISTER SUCTION 2500CC (MISCELLANEOUS) ×3 IMPLANT
CHLORAPREP W/TINT 26ML (MISCELLANEOUS) ×3 IMPLANT
CLIP TI WIDE RED SMALL 6 (CLIP) ×3 IMPLANT
COVER PROBE W GEL 5X96 (DRAPES) ×3 IMPLANT
COVER SURGICAL LIGHT HANDLE (MISCELLANEOUS) ×3 IMPLANT
DERMABOND ADVANCED (GAUZE/BANDAGES/DRESSINGS) ×2
DERMABOND ADVANCED .7 DNX12 (GAUZE/BANDAGES/DRESSINGS) ×1 IMPLANT
DEVICE DUBIN SPECIMEN MAMMOGRA (MISCELLANEOUS) ×3 IMPLANT
DRAPE CHEST BREAST 15X10 FENES (DRAPES) ×3 IMPLANT
DRAPE UTILITY XL STRL (DRAPES) ×3 IMPLANT
ELECT COATED BLADE 2.86 ST (ELECTRODE) ×3 IMPLANT
ELECT REM PT RETURN 9FT ADLT (ELECTROSURGICAL) ×3
ELECTRODE REM PT RTRN 9FT ADLT (ELECTROSURGICAL) ×1 IMPLANT
GAUZE SPONGE 4X4 12PLY STRL (GAUZE/BANDAGES/DRESSINGS) ×3 IMPLANT
GLOVE BIOGEL PI IND STRL 6.5 (GLOVE) IMPLANT
GLOVE BIOGEL PI INDICATOR 6.5 (GLOVE) ×2
GLOVE SURG SIGNA 7.5 PF LTX (GLOVE) ×6 IMPLANT
GLOVE SURG SS PI 6.5 STRL IVOR (GLOVE) ×2 IMPLANT
GOWN STRL REUS W/ TWL LRG LVL3 (GOWN DISPOSABLE) ×1 IMPLANT
GOWN STRL REUS W/ TWL XL LVL3 (GOWN DISPOSABLE) ×1 IMPLANT
GOWN STRL REUS W/TWL LRG LVL3 (GOWN DISPOSABLE) ×3
GOWN STRL REUS W/TWL XL LVL3 (GOWN DISPOSABLE) ×3
KIT BASIN OR (CUSTOM PROCEDURE TRAY) ×3 IMPLANT
KIT MARKER MARGIN INK (KITS) ×3 IMPLANT
NDL FILTER BLUNT 18X1 1/2 (NEEDLE) IMPLANT
NDL HYPO 25X1 1.5 SAFETY (NEEDLE) ×1 IMPLANT
NDL SAFETY ECLIPSE 18X1.5 (NEEDLE) IMPLANT
NEEDLE FILTER BLUNT 18X 1/2SAF (NEEDLE)
NEEDLE FILTER BLUNT 18X1 1/2 (NEEDLE) IMPLANT
NEEDLE HYPO 18GX1.5 SHARP (NEEDLE)
NEEDLE HYPO 25X1 1.5 SAFETY (NEEDLE) ×3 IMPLANT
NS IRRIG 1000ML POUR BTL (IV SOLUTION) ×3 IMPLANT
PACK SURGICAL SETUP 50X90 (CUSTOM PROCEDURE TRAY) ×3 IMPLANT
PENCIL BUTTON HOLSTER BLD 10FT (ELECTRODE) ×3 IMPLANT
SPONGE LAP 18X18 X RAY DECT (DISPOSABLE) ×3 IMPLANT
SUT MNCRL AB 4-0 PS2 18 (SUTURE) ×3 IMPLANT
SUT VIC AB 3-0 SH 8-18 (SUTURE) ×3 IMPLANT
SYR BULB 3OZ (MISCELLANEOUS) ×3 IMPLANT
SYR CONTROL 10ML LL (SYRINGE) ×3 IMPLANT
TOWEL OR 17X24 6PK STRL BLUE (TOWEL DISPOSABLE) ×3 IMPLANT
TOWEL OR 17X26 10 PK STRL BLUE (TOWEL DISPOSABLE) ×3 IMPLANT
TUBE CONNECTING 12'X1/4 (SUCTIONS) ×1
TUBE CONNECTING 12X1/4 (SUCTIONS) ×2 IMPLANT
YANKAUER SUCT BULB TIP NO VENT (SUCTIONS) ×3 IMPLANT

## 2016-06-06 NOTE — Op Note (Signed)
06/06/2016  11:30 AM  PATIENT:  [name] DOB: May 08, 1946 MRN: [id number]  PREOP DIAGNOSIS:  LEFT BREAST CANCER  POSTOP DIAGNOSIS:   Left breast cancer, 3 o'clock position (T1, N0)  PROCEDURE:   Procedure(s): LEFT BREAST LUMPECTOMY WITH RADIOACTIVE SEED AND LEFT AXILLARY SENTINEL LYMPH NODE BIOPSY, deep sentinel lymph node biopsy (Single incision)  SURGEON:   Alphonsa Overall, M.D.  ANESTHESIA:   general  Anesthesiologist: Roberts Gaudy, MD; Catalina Gravel, MD CRNA: Neldon Newport, CRNA; Mariea Clonts, CRNA  General  EBL:  <50 cc  ml  DRAINS: none   LOCAL MEDICATIONS USED:   20 cc of 1/4% marcaine, left pectoral block by anesthesia  SPECIMEN:   Left breast lumpectomy and left axillary sentinel lymph node biopsy (counts 180/background 0)  COUNTS CORRECT:  YES  INDICATIONS FOR PROCEDURE:  [name] is a 71 y.o. (DOB: [birth year]) white female whose primary care physician is Gennette Pac, MD and comes for left breast lumpectomy and left axillary sentinel lymph node biopsy.   She was seen in the Breast multidisciplinary clinic on 04/16/2016 with Drs. Magrinat and Kinard. The options for breast cancer treatment have been discussed with the patient. She elected to proceed with lumpectomy and axillary sentinel lymph node.    There were several things to do prior to surgery - cardiac clearance, move pacemaker from left chest to right chest, and go through genetic testing.    The indications and potential complications of surgery were explained to the patient. Potential complications include, but are not limited to, bleeding, infection, the need for further surgery, and nerve injury.     She had a I131 seed placed in her left breast at [facility].  I confirmed the presence of the I131 seed in the pre op area using the Neoprobe.  The seed is in the 3 o'clock position of the left breast.   In the holding area, her left areola was injected with 1 millicurie of Technitium Sulfur  Colloid.  OPERATIVE NOTE:   The patient was taken to room # 2 at [place] where she underwent a general anesthesia  supervised by Anesthesiologist: Roberts Gaudy, MD; Catalina Gravel, MD CRNA: Neldon Newport, CRNA; Mariea Clonts, CRNA. Her left breast and axilla were prepped with  ChloraPrep and sterilely draped.    A time-out and the surgical check list was reviewed.    I turned attention to the cancer which was about at the 3 o'clock position of the left breast.   I used the Neoprobe to identify the I131 seed.  I tried to excise an area around the tumor of at least 1 cm.    I excised this block of breast tissue approximately 4 cm by 4 cm in diameter.  I excised the lumpectomy down to the chest wall.  I painted the lumpectomy specimen with the 6 color paint kit and did a specimen mammogram which confirmed the mass, clip, and the seed were all in the central position in the specimen.  The specimen was sent to pathology who called back to confirm that they have the seed and the specimen.   I then started the left deep axillary sentinel lymph node biopsy. I went through the left breast lumpectomy incision at 3 o'clock and I found a hot area at the junction of the breast and the pectoralis major muscle, deep in the axilla. I cut down and  identified a hot node that had counts of 180 and  the background has 0 counts.  I checked her internal mammary nodes and supraclavicular nodes with the neoprobe and found no other hot area. The axillary node was then sent to pathology.    I then irrigated the wound with saline. I infiltrated approximately 20 mL of 1/4% Marcaine in the incision.  She had had a left pectoral block by anesthesia in the holding area.    I placed 4 clips to mark biopsy cavity, at 12, 3, 6, and 9 o'clock.  I then closed all the wounds in layers using 3-0 Vicryl sutures for the deep layer. At the skin, I closed the incisions with a 4-0 Monocryl suture. The incision was then  painted with LiquiBand.  She had gauze place over the wound and placed in a breast binder.   The patient tolerated the procedure well, was transported to the recovery room in good condition. Sponge and needle count were correct at the end of the case.   Final pathology is pending.   Alphonsa Overall, MD, [facility] Surgery Pager: [phone] Office phone:  [phone]

## 2016-06-06 NOTE — Transfer of Care (Signed)
Immediate Anesthesia Transfer of Care Note  Patient: [name]  Procedure(s) Performed: Procedure(s): LEFT BREAST LUMPECTOMY WITH RADIOACTIVE SEED AND LEFT AXILLARY SENTINEL LYMPH NODE BIOPSY (Left)  Patient Location: PACU  Anesthesia Type:General  Level of Consciousness: sedated  Airway & Oxygen Therapy: Patient Spontanous Breathing and Patient connected to nasal cannula oxygen  Post-op Assessment: Report given to RN, Post -op Vital signs reviewed and stable and Patient moving all extremities X 4  Post vital signs: Reviewed and stable  Last Vitals:  Vitals:   06/06/16 0959 06/06/16 1138  BP: (!) 137/54 (P) 129/65  Pulse: 62   Resp: 17   Temp:  (P) 36.3 C    Last Pain:  Vitals:   06/06/16 1138  TempSrc:   PainSc: (P) 0-No pain         Complications: No apparent anesthesia complications

## 2016-06-06 NOTE — Anesthesia Preprocedure Evaluation (Addendum)
Anesthesia Evaluation  Patient identified by MRN, date of birth, ID band Patient awake    Reviewed: Allergy & Precautions, NPO status , Patient's Chart, lab work & pertinent test results  History of Anesthesia Complications Negative for: history of anesthetic complications  Airway Mallampati: II  TM Distance: >3 FB Neck ROM: Full    Dental  (+) Teeth Intact, Dental Advisory Given   Pulmonary neg pulmonary ROS,    Pulmonary exam normal breath sounds clear to auscultation       Cardiovascular hypertension, Pt. on medications (-) angina(-) CAD and (-) CHF + pacemaker (2/2 SSS)  Rhythm:Regular Rate:Normal  Paced rhythm HLD stress test on 01/05/2015 - which showed a 55% EF with low risk study   Neuro/Psych negative neurological ROS  negative psych ROS   GI/Hepatic Neg liver ROS, PUD, GERD  Medicated,  Endo/Other  Hypothyroidism   Renal/GU negative Renal ROS     Musculoskeletal  (+) Arthritis , Osteoarthritis,    Abdominal   Peds  Hematology negative hematology ROS (+)   Anesthesia Other Findings Day of surgery medications reviewed with the patient.  Left breast cancer  Reproductive/Obstetrics                            Anesthesia Physical Anesthesia Plan  ASA: III  Anesthesia Plan: General   Post-op Pain Management: GA combined w/ Regional for post-op pain   Induction: Intravenous  Airway Management Planned: LMA  Additional Equipment:   Intra-op Plan:   Post-operative Plan: Extubation in OR  Informed Consent: I have reviewed the patients History and Physical, chart, labs and discussed the procedure including the risks, benefits and alternatives for the proposed anesthesia with the patient or authorized representative who has indicated his/her understanding and acceptance.   Dental advisory given  Plan Discussed with: CRNA  Anesthesia Plan Comments: (Risks/benefits of general  anesthesia discussed with patient including risk of damage to teeth, lips, gum, and tongue, nausea/vomiting, allergic reactions to medications, and the possibility of heart attack, stroke and death.  All patient questions answered.  Patient wishes to proceed.  PECS block.)        Anesthesia Quick Evaluation

## 2016-06-06 NOTE — Discharge Instructions (Signed)
[facility] [place] [facility] - DISCHARGE INSTRUCTIONS TO PATIENT  Activity:  Driving - May drive in 2 or 3 days, if doing well   Lifting - No lifting more than 15 pounds for 5 days, then no limit  Wound Care:   Leave dressing on and incision dry for 2 days, then may remove dressing and shower  Diet:  As tolerated  Follow up appointment:  Call Dr. Pollie Friar office [facility]) at [phone] for an appointment in 2 to 3 weeks.  Medications and dosages:  Resume your home medications.  You have a prescription for:  Vicodin  Call Dr. Lucia Gaskins or his office  [phone]) if you have:  Temperature greater than 100.4,  Severe uncontrolled pain,  Redness, tenderness, or signs of infection (pain, swelling, redness, odor or green/yellow discharge around the site),  Any other questions or concerns you may have after discharge.  In an emergency, call 911 or go to an Emergency Department at a nearby hospital.

## 2016-06-06 NOTE — Anesthesia Procedure Notes (Signed)
Anesthesia Regional Block:  Pectoralis block  Pre-Anesthetic Checklist: ,, timeout performed, Correct Patient, Correct Site, Correct Laterality, Correct Procedure, Correct Position, site marked, Risks and benefits discussed,  Surgical consent,  Pre-op evaluation,  At surgeon's request and post-op pain management  Laterality: Left  Prep: chloraprep       Needles:  Injection technique: Single-shot  Needle Type: Echogenic Needle     Needle Length: 9cm 9 cm Needle Gauge: 21 and 21 G    Additional Needles:  Procedures: ultrasound guided (picture in chart) Pectoralis block Narrative:  Start time: 06/06/2016 9:34 AM End time: 06/06/2016 9:39 AM Injection made incrementally with aspirations every 5 mL.  Performed by: Personally  Anesthesiologist: Catalina Gravel  Additional Notes: No pain on injection. No increased resistance to injection. Injection made in 5cc increments.  Good needle visualization.  Patient tolerated procedure well.

## 2016-06-06 NOTE — Interval H&P Note (Signed)
History and Physical Interval Note:  06/06/2016 10:09 AM  [name]  has presented today for surgery, with the diagnosis of LEFT BREAST CANCER  The various methods of treatment have been discussed with the patient and family.  Seed in place.  Husband and both daughters are here.  After consideration of risks, benefits and other options for treatment, the patient has consented to  Procedure(s): LEFT BREAST LUMPECTOMY WITH RADIOACTIVE SEED AND LEFT AXILLARY SENTINEL LYMPH NODE BIOPSY (Left) as a surgical intervention .  The patient's history has been reviewed, patient examined, no change in status, stable for surgery.  I have reviewed the patient's chart and labs.  Questions were answered to the patient's satisfaction.     [name] H

## 2016-06-06 NOTE — Anesthesia Procedure Notes (Signed)
Procedure Name: LMA Insertion Date/Time: 06/06/2016 10:27 AM Performed by: Neldon Newport Pre-anesthesia Checklist: Timeout performed, Patient being monitored, Suction available, Emergency Drugs available and Patient identified Patient Re-evaluated:Patient Re-evaluated prior to inductionOxygen Delivery Method: Circle system utilized Preoxygenation: Pre-oxygenation with 100% oxygen Intubation Type: IV induction Ventilation: Mask ventilation without difficulty LMA: LMA inserted LMA Size: 4.0

## 2016-06-07 NOTE — Anesthesia Postprocedure Evaluation (Addendum)
Anesthesia Post Note  Patient: [name]  Procedure(s) Performed: Procedure(s) (LRB): LEFT BREAST LUMPECTOMY WITH RADIOACTIVE SEED AND LEFT AXILLARY SENTINEL LYMPH NODE BIOPSY (Left)  Patient location during evaluation: PACU Anesthesia Type: General and Regional Level of consciousness: awake, awake and alert and oriented Pain management: pain level controlled Vital Signs Assessment: post-procedure vital signs reviewed and stable Respiratory status: spontaneous breathing, nonlabored ventilation and respiratory function stable Cardiovascular status: blood pressure returned to baseline Anesthetic complications: no       Last Vitals:  Vitals:   06/06/16 1222 06/06/16 1241  BP: (!) 155/70 (!) 162/85  Pulse: 60 67  Resp: 13 16  Temp:      Last Pain:  Vitals:   06/06/16 1210  TempSrc:   PainSc: 6                  Deannah Rossi COKER

## 2016-06-08 ENCOUNTER — Encounter (HOSPITAL_COMMUNITY): Payer: Self-pay | Admitting: Surgery

## 2016-06-16 NOTE — Progress Notes (Signed)
[facility] [facility]  Telephone:([phone]) [phone] [phone]     ID: [name] DOB: [birth year]  MR#: [id number]  XKG#:[id number]  Patient Care Team: Alexis Fess, MD as PCP - General (Family Medicine) Alexis Cruel, MD as Consulting Physician (Oncology) Alexis Pray, MD as Consulting Physician (Radiation Oncology) Alexis Overall, MD as Consulting Physician (General Surgery) Alexis Irani, MD as Consulting Physician (Gastroenterology) Alexis Cruel, MD OTHER MD:  CHIEF COMPLAINT: HER-2 positive breast cancer  CURRENT TREATMENT: Awaiting adjuvant radiation   BREAST CANCER HISTORY: From the original intake note:   [name] had bilateral screening mammography at [facility] 03/28/[facility]. This found the breast density to be category A. In the left breast at the 12:00 position there was a new oval lesion. There were no other findings of concern. On 04/04/2016 the patient underwent left ultrasonography this showed a possible hypoechoic lesion in the upper outer quadrant of the left breast, measuring approximately 0.5 cm. The left axilla was mammographically benign.  Biopsy of the left breast area in question 04/09/2016 showed ([id number]) invasive ductal carcinoma, grade 1 or 2, estrogen and progesterone receptor negative, with an MIB-1 of 10%, but HER-2 amplified, the signals ratio being 7.36 and the number per cell 9.20.  Her subsequent history is as detailed below.  INTERVAL HISTORY: [name] returns today for follow up of her breast cancer accompanied by her husband. Since her last visit here she had her left lumpectomy and sentinel lymph node sampling. The surgery on 06/06/2016 found ([id number]) an invasive ductal carcinoma measuring 0.3 cm, grade 2, with both sentinel nodes clear. Margins were ample.  REVIEW OF SYSTEMS: She did well with the surgery although she still has a little bit of soreness. She is not taking pain medication at present. 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 She is doing some house work in her own place and of course some cooking. A detailed review of systems today was otherwise stable   PAST MEDICAL HISTORY: Past Medical History:  Diagnosis Date  . Allergic rhinitis   . Arthritis   . Family history of breast cancer   . Family history of colon cancer   . Family history of pancreatic cancer   . GERD (gastroesophageal reflux disease)   . GI bleed   . History of basal cell cancer   . Hyperlipidemia   . Hypertension   . Hypothyroidism   . Lumbar disc disease   . Malignant neoplasm of upper-outer quadrant of left female breast ([place]) 04/11/2016  . Pacemaker   . Peptic ulcer disease   . SSS (sick sinus syndrome) ([place])   . Thyroid disease     PAST SURGICAL HISTORY: Past Surgical History:  Procedure Laterality Date  . ABDOMINAL HYSTERECTOMY    . BREAST LUMPECTOMY WITH RADIOACTIVE SEED AND SENTINEL LYMPH NODE BIOPSY Left 06/06/2016   Procedure: LEFT BREAST LUMPECTOMY WITH RADIOACTIVE SEED AND LEFT AXILLARY SENTINEL LYMPH NODE BIOPSY;  Surgeon: Alexis Overall, MD;  Location: [facility];  Service: General;  Laterality: Left;  . BREAST SURGERY     left  . EP IMPLANTABLE DEVICE N/A 05/15/2016   Procedure: PPM Generator Changeout;  Surgeon: Evans Lance, MD;  Location: [facility];  Service: Cardiovascular;  Laterality: N/A;  . HERNIA REPAIR    . PACEMAKER INSERTION      FAMILY HISTORY Family History  Problem Relation Age of Onset  . Congestive Heart Failure Mother   . Hypertension Father   .  Bone cancer Father   . Breast cancer Sister 14  . Bradycardia Cousin   . Breast cancer Cousin 70  . Breast cancer Cousin 20  . Colon cancer Cousin 73  . Pancreatic cancer Paternal Aunt 76  . Rectal cancer Paternal Aunt 51  . Heart attack Maternal Grandmother   . Heart attack Paternal Grandmother   . Stroke Paternal Grandfather   . Melanoma Daughter     dx in her 42s  The patient's father died at the age of  26 with myeloma. The patient's mother died at the age of 51 from heart disease the patient has a sister diagnosed with breast cancer at age 39, a paternal cousin diagnosed with breast cancer at age 59, and a maternal cousin diagnosed with breast cancer. There is also a history of pancreatic, rectal, and: colon cancers all on the father's side of the family  GYNECOLOGIC HISTORY:  No LMP recorded. Patient has had a hysterectomy. Menarche age 66, first live birth age 37, the patient is [name] P2. She stopped having periods in 1981. She did not take hormone replacement. She used oral contraceptives remotely with no complications  SOCIAL HISTORY:  She has worked as a Pharmacist, hospital substituted, in Scientist, research (medical), and more recently as a Engineer, building services. Her husband [name] is under the care of hospice because of cirrhosis. The patient's daughter [name] lives in [place] where she works as a Pharmacist, hospital. Her daughter [name] lives in [place]. 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     ADVANCED DIRECTIVES: The patient's daughter, [name], is her healthcare power of attorney. She can be reached at [phone]   HEALTH MAINTENANCE: Social History  Substance Use Topics  . Smoking status: Never Smoker  . Smokeless tobacco: Never Used  . Alcohol use No     Colonoscopy: 2007/ [facility]  PAP:  Bone density: 03/28/2016 at [facility], T score of -1.3   Allergies  Allergen Reactions  . Nsaids Other (See Comments)    Upper GI bleed  . Penicillins Itching    Has patient had a PCN reaction causing immediate rash, facial/tongue/throat swelling, SOB or lightheadedness with hypotension:No Has patient had a PCN reaction causing severe rash involving mucus membranes or skin necrosis:No Has patient had a PCN reaction that required hospitalization:No Has patient had a PCN reaction occurring within the last 10 years:No If all of the above answers are "NO", then may proceed with Cephalosporin use.   . Bee  Venom   . Sulfa Antibiotics     itching  . Wasp Venom     Current Outpatient Prescriptions  Medication Sig Dispense Refill  . albuterol (PROVENTIL HFA;VENTOLIN HFA) 108 (90 BASE) MCG/ACT inhaler Inhale into the lungs every 6 (six) hours as needed for wheezing or shortness of breath.    Marland Kitchen albuterol (PROVENTIL) (2.5 MG/3ML) 0.083% nebulizer solution Take 2.5 mg by nebulization every 6 (six) hours as needed for wheezing or shortness of breath.    Marland Kitchen atorvastatin (LIPITOR) 80 MG tablet Take 80 mg by mouth every evening.     Marland Kitchen azelastine (ASTELIN) 137 MCG/SPRAY nasal spray Place 2 sprays into both nostrils 2 (two) times daily as needed for allergies. Use in each nostril as directed    . DIHYDROERGOTAMINE MESYLATE NA Inject 2 mLs into the muscle daily as needed (for migraine headaches).     . fenofibrate 160 MG tablet Take 160 mg by mouth every evening.     . Fish Oil OIL Take 1,000 mg  by mouth daily.     Marland Kitchen HYDROcodone-acetaminophen (NORCO/VICODIN) 5-325 MG tablet Take 1-2 tablets by mouth every 6 (six) hours as needed. 20 tablet 0  . levothyroxine (SYNTHROID, LEVOTHROID) 100 MCG tablet Take 100 mcg by mouth daily before breakfast.    . mometasone (NASONEX) 50 MCG/ACT nasal spray Place 2 sprays into the nose daily as needed (for nasal congestion.).     Marland Kitchen omeprazole (PRILOSEC) 20 MG capsule Take 20 mg by mouth daily.    . promethazine (PHENERGAN) 25 MG tablet Take 25 mg by mouth every 6 (six) hours as needed for nausea or vomiting.    . triamterene-hydrochlorothiazide (MAXZIDE) 75-50 MG tablet Take 1 tablet by mouth every evening.     . Vitamin D, Cholecalciferol, 1000 UNITS TABS Take 1,000 Units by mouth daily.      No current facility-administered medications for this visit.     OBJECTIVE: Middle-aged white woman Who appears stated age 11:   06/17/16 0920  BP: 137/76  Pulse: 85  Resp: 17  Temp: 97.6 F (36.4 C)     Body mass index is 27.63 kg/m.    ECOG FS:1 - Symptomatic but  completely ambulatory  Sclerae unicteric, pupils round and equal Oropharynx clear and moist-- no thrush or other lesions No cervical or supraclavicular adenopathy Lungs no rales or rhonchi Heart regular rate and rhythm Abd soft, nontender, positive bowel sounds MSK no focal spinal tenderness, no upper extremity lymphedema Neuro: nonfocal, well oriented, appropriate affect Breasts: The right breast is benign. The left breast is status post recent lumpectomy. The incision is healing nicely, with no dehiscence. There is a minimal blush and slight swelling laterally, not unexpected at this stage of the game. The left axilla is benign.   LAB RESULTS:  CMP     Component Value Date/Time   NA 138 05/30/2016 0840   NA 142 04/16/2016 0822   K 3.9 05/30/2016 0840   K 3.8 04/16/2016 0822   CL 106 05/30/2016 0840   CO2 25 05/30/2016 0840   CO2 23 04/16/2016 0822   GLUCOSE 84 05/30/2016 0840   GLUCOSE 102 04/16/2016 0822   BUN 21 (H) 05/30/2016 0840   BUN 25.3 04/16/2016 0822   CREATININE 0.75 05/30/2016 0840   CREATININE 0.97 (H) 05/01/2016 1627   CREATININE 0.8 04/16/2016 0822   CALCIUM 9.5 05/30/2016 0840   CALCIUM 9.7 04/16/2016 0822   PROT 6.9 04/16/2016 0822   ALBUMIN 3.8 04/16/2016 0822   AST 20 04/16/2016 0822   ALT 18 04/16/2016 0822   ALKPHOS 68 04/16/2016 0822   BILITOT 0.60 04/16/2016 0822   GFRNONAA >60 05/30/2016 0840   GFRAA >60 05/30/2016 0840    INo results found for: SPEP, UPEP  Lab Results  Component Value Date   WBC 5.2 05/30/2016   NEUTROABS 3,024 05/01/2016   HGB 13.6 05/30/2016   HCT 40.1 05/30/2016   MCV 86.1 05/30/2016   PLT 334 05/30/2016      Chemistry      Component Value Date/Time   NA 138 05/30/2016 0840   NA 142 04/16/2016 0822   K 3.9 05/30/2016 0840   K 3.8 04/16/2016 0822   CL 106 05/30/2016 0840   CO2 25 05/30/2016 0840   CO2 23 04/16/2016 0822   BUN 21 (H) 05/30/2016 0840   BUN 25.3 04/16/2016 0822   CREATININE 0.75 05/30/2016  0840   CREATININE 0.97 (H) 05/01/2016 1627   CREATININE 0.8 04/16/2016 0822      Component Value  Date/Time   CALCIUM 9.5 05/30/2016 0840   CALCIUM 9.7 04/16/2016 0822   ALKPHOS 68 04/16/2016 0822   AST 20 04/16/2016 0822   ALT 18 04/16/2016 0822   BILITOT 0.60 04/16/2016 0822       No results found for: LABCA2  No components found for: LABCA125  No results for input(s): INR in the last 168 hours.  Urinalysis No results found for: COLORURINE, APPEARANCEUR, LABSPEC, PHURINE, GLUCOSEU, HGBUR, BILIRUBINUR, KETONESUR, PROTEINUR, UROBILINOGEN, NITRITE, LEUKOCYTESUR   STUDIES: No results found.  ELIGIBLE FOR AVAILABLE RESEARCH PROTOCOL: no  ASSESSMENT: 71 y.o. [place] woman status post left breast upper outer quadrant biopsy 04/09/2016 for a clinical T1a N0, stage IA invasive ductal carcinoma, grade 1 or 2, estrogen and progesterone receptor negative, with an MIB-1 of 10%, but HER-2 amplified  (1) left lumpectomy and sentinel lymph node samplng 06/06/2016 showed a pT1a pN0, stage IA  Invasive ductal carcinoma , grade 2 , with negatve margins  (2)   No consideration of anti-HER-2 immunotherapy/chemotherapy given final tumor size  (3) adjuvant radiation Pending   (a) the patient's pacemaker has been moved to the contralateral side facilitate treatment   (4)  Genetics testing through the Comprehensive Cancer Panel offered by [facility]  found no deleterious mutations in APC, ATM, AXIN2, BARD1, BMPR1A, BRCA1, BRCA2, BRIP1, CDH1, CDK4, CDKN2A, CHEK2, EPCAM, FANCC, MLH1, MSH2, MSH6, MUTYH, NBN, PALB2, PMS2, POLD1, POLE, PTEN, RAD51C, RAD51D, SCG5/GREM1, SMAD4, STK11, TP53, VHL, and XRCC2.     PLAN: I spent approximately 30 minutes today with Ambrosia and her husband going over her situation. She understands that she has an excellent prognosis. When tumors are this small chance of they're having spread outside of the local area by the time they are removed is so low that systemic therapy  even of the simplest kind such as anti-estrogens is considered but not mandated.  In her case of course the cancer was estrogen receptor negative so anti-estrogens are not a consideration. The cancer was HER-2 positive however the standard recommendation of the NCCN is no systemic treatment for T1a HER-2 positive tumors. This is even more true of chemotherapy.  We also discussed her favorable genetics results. After all this Swan felt significantly reassured and she understands her chance of being alive and distant disease-free 5 years from now is in the range of 95%.  Accordingly she is ready to proceed to radiation. She already had her port removed to the contralateral side to facilitate this. I am making her a return appointment with Dr. Sondra Come for the next week or 2. I will see her again in approximately 10 weeks by which time I expect she will have completed her radiation treatments and we can start long-term follow-up.  Alexis Cruel, MD   06/17/2016 9:53 AM Medical Oncology and Hematology [facility] [address] [place], [place] [address] Tel. [phone]    Fax. [phone]

## 2016-06-17 ENCOUNTER — Ambulatory Visit (HOSPITAL_BASED_OUTPATIENT_CLINIC_OR_DEPARTMENT_OTHER): Payer: Medicare HMO | Admitting: Oncology

## 2016-06-17 VITALS — BP 137/76 | HR 85 | Temp 97.6°F | Resp 17 | Ht 63.25 in | Wt 157.2 lb

## 2016-06-17 DIAGNOSIS — Z171 Estrogen receptor negative status [ER-]: Secondary | ICD-10-CM

## 2016-06-17 DIAGNOSIS — C50412 Malignant neoplasm of upper-outer quadrant of left female breast: Secondary | ICD-10-CM

## 2016-06-17 NOTE — Addendum Note (Signed)
Addendum  created 06/17/16 1350 by Catalina Gravel, MD   Anesthesia Staff edited

## 2016-06-18 ENCOUNTER — Encounter: Payer: Self-pay | Admitting: Radiation Oncology

## 2016-06-24 ENCOUNTER — Ambulatory Visit (INDEPENDENT_AMBULATORY_CARE_PROVIDER_SITE_OTHER): Payer: Medicare HMO | Admitting: Interventional Cardiology

## 2016-06-24 ENCOUNTER — Encounter: Payer: Self-pay | Admitting: Interventional Cardiology

## 2016-06-24 VITALS — BP 136/78 | HR 84 | Ht 63.5 in | Wt 156.0 lb

## 2016-06-24 DIAGNOSIS — I10 Essential (primary) hypertension: Secondary | ICD-10-CM | POA: Diagnosis not present

## 2016-06-24 DIAGNOSIS — Z950 Presence of cardiac pacemaker: Secondary | ICD-10-CM

## 2016-06-24 DIAGNOSIS — I495 Sick sinus syndrome: Secondary | ICD-10-CM | POA: Diagnosis not present

## 2016-06-24 DIAGNOSIS — E782 Mixed hyperlipidemia: Secondary | ICD-10-CM | POA: Diagnosis not present

## 2016-06-24 NOTE — Patient Instructions (Signed)

## 2016-06-24 NOTE — Progress Notes (Signed)
Cardiology Office Note    Date:  06/24/2016   ID:  Brenay, Neufer [birth year], MRN [id number]  PCP:  Gennette Pac, MD  Cardiologist: [name], MD   Chief Complaint  Patient presents with  . Chest Pain  . Pacemaker Problem    History of Present Illness:  [name] is a 71 y.o. female here for follow-up of known conduction system disease, tachycardia-bradycardia syndrome, permanent pacemaker, no clinical evidence of coronary disease with low risk nuclear study, hypertension, hyperlipidemia, and hypothyroidism.   Doing well from cardiac viewpoint. She had multiple questions concerning the genesis of high-grade AV block that required pacemaker air P. She denies orthopnea, PND, and palpitations. There is no peripheral edema.   Past Medical History:  Diagnosis Date  . Allergic rhinitis   . Arthritis   . Family history of breast cancer   . Family history of colon cancer   . Family history of pancreatic cancer   . GERD (gastroesophageal reflux disease)   . GI bleed   . History of basal cell cancer   . Hyperlipidemia   . Hypertension   . Hypothyroidism   . Lumbar disc disease   . Malignant neoplasm of upper-outer quadrant of left female breast ([place]) 04/11/2016  . Pacemaker   . Peptic ulcer disease   . SSS (sick sinus syndrome) ([place])   . Thyroid disease     Past Surgical History:  Procedure Laterality Date  . ABDOMINAL HYSTERECTOMY    . BREAST LUMPECTOMY WITH RADIOACTIVE SEED AND SENTINEL LYMPH NODE BIOPSY Left 06/06/2016   Procedure: LEFT BREAST LUMPECTOMY WITH RADIOACTIVE SEED AND LEFT AXILLARY SENTINEL LYMPH NODE BIOPSY;  Surgeon: Alphonsa Overall, MD;  Location: [facility];  Service: General;  Laterality: Left;  . BREAST SURGERY     left  . EP IMPLANTABLE DEVICE N/A 05/15/2016   Procedure: PPM Generator Changeout;  Surgeon: Evans Lance, MD;  Location: [facility];  Service: Cardiovascular;  Laterality: N/A;  . HERNIA REPAIR    . PACEMAKER  INSERTION      Current Medications: Outpatient Medications Prior to Visit  Medication Sig Dispense Refill  . albuterol (PROVENTIL HFA;VENTOLIN HFA) 108 (90 BASE) MCG/ACT inhaler Inhale into the lungs every 6 (six) hours as needed for wheezing or shortness of breath.    Marland Kitchen albuterol (PROVENTIL) (2.5 MG/3ML) 0.083% nebulizer solution Take 2.5 mg by nebulization every 6 (six) hours as needed for wheezing or shortness of breath.    Marland Kitchen atorvastatin (LIPITOR) 80 MG tablet Take 80 mg by mouth every evening.     Marland Kitchen azelastine (ASTELIN) 137 MCG/SPRAY nasal spray Place 2 sprays into both nostrils 2 (two) times daily as needed for allergies. Use in each nostril as directed    . DIHYDROERGOTAMINE MESYLATE NA Inject 2 mLs into the muscle daily as needed (for migraine headaches).     . fenofibrate 160 MG tablet Take 160 mg by mouth every evening.     . Fish Oil OIL Take 1,000 mg by mouth daily.     Marland Kitchen levothyroxine (SYNTHROID, LEVOTHROID) 100 MCG tablet Take 100 mcg by mouth daily before breakfast.    . mometasone (NASONEX) 50 MCG/ACT nasal spray Place 2 sprays into the nose daily as needed (for nasal congestion.).     Marland Kitchen omeprazole (PRILOSEC) 20 MG capsule Take 20 mg by mouth daily.    . promethazine (PHENERGAN) 25 MG tablet Take 25 mg by mouth every 6 (six) hours as needed for  nausea or vomiting.    . triamterene-hydrochlorothiazide (MAXZIDE) 75-50 MG tablet Take 1 tablet by mouth every evening.     . Vitamin D, Cholecalciferol, 1000 UNITS TABS Take 1,000 Units by mouth daily.     Marland Kitchen HYDROcodone-acetaminophen (NORCO/VICODIN) 5-325 MG tablet Take 1-2 tablets by mouth every 6 (six) hours as needed. (Patient not taking: Reported on 06/24/2016) 20 tablet 0   No facility-administered medications prior to visit.      Allergies:   Nsaids; Penicillins; Bee venom; Sulfa antibiotics; and Wasp venom   Social History   Social History  . Marital status: Married    Spouse name: N/A  . Number of children: 2  . Years  of education: N/A   Social History Main Topics  . Smoking status: Never Smoker  . Smokeless tobacco: Never Used  . Alcohol use No  . Drug use: No  . Sexual activity: No   Other Topics Concern  . None   Social History Narrative  . None     Family History:  The patient's family history includes Bone cancer in her father; Bradycardia in her cousin; Breast cancer (age of onset: 41) in her cousin and cousin; Breast cancer (age of onset: 37) in her sister; Colon cancer (age of onset: 74) in her cousin; Congestive Heart Failure in her mother; Heart attack in her maternal grandmother and paternal grandmother; Hypertension in her father; Melanoma in her daughter; Pancreatic cancer (age of onset: 84) in her paternal aunt; Rectal cancer (age of onset: 62) in her paternal aunt; Stroke in her paternal grandfather.   ROS:   Please see the history of present illness.    She underwent successful surgery related to her breast cancer. She denies complaints.  All other systems reviewed and are negative.   PHYSICAL EXAM:   VS:  BP 136/78 (Patient Position: Sitting)   Pulse 84   Ht 5' 3.5" (1.613 m)   Wt 156 lb (70.8 kg)   BMI 27.20 kg/m    GEN: Well nourished, well developed, in no acute distress  HEENT: normal  Neck: no JVD, carotid bruits, or masses Cardiac: RRR; no murmurs, rubs, or gallops,no edema  Respiratory:  clear to auscultation bilaterally, normal work of breathing GI: soft, nontender, nondistended, + BS MS: no deformity or atrophy  Skin: warm and dry, no rash Neuro:  Alert and Oriented x 3, Strength and sensation are intact Psych: euthymic mood, full affect  Wt Readings from Last 3 Encounters:  06/24/16 156 lb (70.8 kg)  06/17/16 157 lb 3.2 oz (71.3 kg)  06/06/16 155 lb 3.2 oz (70.4 kg)      Studies/Labs Reviewed:   EKG:  EKG  Not repeated.  Recent Labs: 04/16/2016: ALT 18 05/30/2016: BUN 21; Creatinine, Ser 0.75; Hemoglobin 13.6; Platelets 334; Potassium 3.9; Sodium  138   Lipid Panel No results found for: CHOL, TRIG, HDL, CHOLHDL, VLDL, LDLCALC, LDLDIRECT  Additional studies/ records that were reviewed today include:  Myocardial perfusion imaging done one year prior to breast surgery was unremarkable for evidence of ischemia. No new functional testing has been performed since 2016.    ASSESSMENT:    1. Sick sinus syndrome ([place])   2. Essential hypertension   3. Mixed hyperlipidemia   4. History of permanent cardiac pacemaker placement      PLAN:  In order of problems listed above:  1. Sick sinus syndrome, been adequately treated with permanent pacemaker therapy. 2. Blood pressures under excellent control on the current medical regimen.  2 grams sodium diet is advocated. 3. Continue current lipid therapy. LDL target is less than 100. This is managed by primary care.    Medication Adjustments/Labs and Tests Ordered: Current medicines are reviewed at length with the patient today.  Concerns regarding medicines are outlined above.  Medication changes, Labs and Tests ordered today are listed in the Patient Instructions below. Patient Instructions  Medication Instructions:  None  Labwork: None  Testing/Procedures: None  Follow-Up: Your physician wants you to follow-up in: 1 year with Dr. Tamala Julian.  You will receive a reminder letter in the mail two months in advance. If you don't receive a letter, please call our office to schedule the follow-up appointment.   Any Other Special Instructions Will Be Listed Below (If Applicable).     If you need a refill on your cardiac medications before your next appointment, please call your pharmacy.      Signed, [name], MD  06/24/2016 1:46 PM    [facility] [address], [place], [place]  [address] Phone: [phone]; Fax: [phone]

## 2016-06-27 NOTE — Progress Notes (Addendum)
Location of Breast Cancer: Left Breast UOQ Invasive Ductal Carcinoma  Histology per Pathology Report:   06/06/16 Diagnosis 1. Breast, lumpectomy, left - INVASIVE DUCTAL CARCINOMA, GRADE 2, SPANNING 0.3 CM. - HIGH GRADE DUCTAL CARCINOMA IN SITU. - RESECTION MARGINS ARE NEGATIVE. - SEE ONCOLOGY TABLE. 2. Lymph node, sentinel, biopsy, Left Axillary - ONE OF ONE LYMPH NODES NEGATIVE FOR CARCINOMA (0/1). 3. Lymph node, sentinel, biopsy, Left - ONE OF ONE LYMPH NODES NEGATIVE FOR CARCINOMA (0/1).  04/09/16 Diagnosis Breast, left, needle core biopsy - INVASIVE DUCTAL CARCINOMA. - DUCTAL CARCINOMA IN SITU WITH CALCIFICATIONS.  Receptor Status: ER(0%), PR (0%), Her2-neu (positive), Ki-(10%)  Did patient present with symptoms (if so, please note symptoms) or was this found on screening mammography?: bilateral screening mammography   Past/Anticipated interventions by surgeon, if any: 06/06/16 - Procedure: LEFT BREAST LUMPECTOMY WITH RADIOACTIVE SEED AND LEFT AXILLARY SENTINEL LYMPH NODE BIOPSY;  Surgeon: David Newman, MD  Past/Anticipated interventions by medical oncology, if any: no  Lymphedema issues, if any:  no    Pain issues, if any:  no   SAFETY ISSUES:  Prior radiation? no  Pacemaker/ICD? yes  Possible current pregnancy?no  Is the patient on methotrexate? no  Current Complaints / other details:  Patient had her pacemaker moved from her left chest to her right chest before her lumpectomy.  Patient reports having some pain under her left arm.  She also reports having redness on the side of her left breast.  She is requesting late treatment times due to her work schedule.  BP (!) 150/72 (BP Location: Right Arm, Patient Position: Sitting)   Pulse 64   Temp 98.2 F (36.8 C) (Oral)   Ht 5' 3.5" (1.613 m)   Wt 157 lb 3.2 oz (71.3 kg)   SpO2 98%   BMI 27.41 kg/m   Wt Readings from Last 3 Encounters:  06/30/16 157 lb 3.2 oz (71.3 kg)  06/24/16 156 lb (70.8 kg)  06/17/16 157  lb 3.2 oz (71.3 kg)     Hess, Karen R, RN 06/27/2016,8:00 AM   

## 2016-06-30 ENCOUNTER — Ambulatory Visit
Admission: RE | Admit: 2016-06-30 | Discharge: 2016-06-30 | Disposition: A | Discharge status: Home / Self Care | Payer: Medicare HMO | Source: Ambulatory Visit | Attending: Radiation Oncology | Admitting: Radiation Oncology

## 2016-06-30 ENCOUNTER — Encounter: Payer: Self-pay | Admitting: Radiation Oncology

## 2016-06-30 DIAGNOSIS — Z950 Presence of cardiac pacemaker: Secondary | ICD-10-CM | POA: Diagnosis not present

## 2016-06-30 DIAGNOSIS — C50412 Malignant neoplasm of upper-outer quadrant of left female breast: Secondary | ICD-10-CM

## 2016-06-30 DIAGNOSIS — Z171 Estrogen receptor negative status [ER-]: Secondary | ICD-10-CM | POA: Insufficient documentation

## 2016-06-30 DIAGNOSIS — Z79899 Other long term (current) drug therapy: Secondary | ICD-10-CM | POA: Diagnosis not present

## 2016-06-30 DIAGNOSIS — Z510 Encounter for antineoplastic radiation therapy: Secondary | ICD-10-CM | POA: Diagnosis not present

## 2016-06-30 DIAGNOSIS — Z888 Allergy status to other drugs, medicaments and biological substances status: Secondary | ICD-10-CM | POA: Diagnosis not present

## 2016-06-30 DIAGNOSIS — Z882 Allergy status to sulfonamides status: Secondary | ICD-10-CM | POA: Diagnosis not present

## 2016-06-30 DIAGNOSIS — Z880 Allergy status to penicillin: Secondary | ICD-10-CM | POA: Diagnosis not present

## 2016-06-30 DIAGNOSIS — Z9889 Other specified postprocedural states: Secondary | ICD-10-CM | POA: Diagnosis not present

## 2016-06-30 MED ORDER — DOXYCYCLINE HYCLATE 100 MG PO TABS: 100.0000 mg | ORAL_TABLET | Freq: Two times a day (BID) | ORAL | 0 refills | Status: DC

## 2016-06-30 NOTE — Progress Notes (Signed)
Radiation Oncology         (336) [phone] ________________________________  Name: MADIHA BAMBRICK MRN: [id number]  Date: 06/30/2016  DOB: 04-21-1946  Re-evaluation Note  CC: Gennette Pac, MD  Magrinat, Virgie Dad, MD    ICD-9-CM ICD-10-CM   1. 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 Since clinic, the patient underwent a left lumpectomy and sentinel lymph node biopsy on 06/06/16. This revealed  invasive ductal carcinoma, grade 2, spanning 0.3 cm with negative margins. Sentinel lymph node biopsy showed two nodes negative for carcinoma (0/2).   Based on the size of the patient's tumor,  adjuvant HER-2/neu therapy was not recommended by Dr. Jana Hakim.  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 She reports pain under her left arm. She notes redness on the side of her left breast. She denies lymphedema at this time.                        ALLERGIES:  is allergic to nsaids; penicillins; bee venom; sulfa antibiotics; and wasp venom.  Meds: Current Outpatient Prescriptions  Medication Sig Dispense Refill  . atorvastatin (LIPITOR) 80 MG tablet Take 80 mg by mouth every evening.     Marland Kitchen azelastine (ASTELIN) 137 MCG/SPRAY nasal spray Place 2 sprays into both nostrils 2 (two) times daily as needed for allergies. Use in each nostril as directed    . fenofibrate 160 MG tablet Take 160 mg by mouth every evening.     . Fish Oil OIL Take 1,000 mg by mouth daily.     Marland Kitchen levothyroxine (SYNTHROID, LEVOTHROID) 100 MCG tablet Take 100 mcg by mouth daily before breakfast.    . mometasone (NASONEX) 50 MCG/ACT nasal spray Place 2 sprays into the nose daily as needed (for  nasal congestion.).     Marland Kitchen omeprazole (PRILOSEC) 20 MG capsule Take 20 mg by mouth daily.    Marland Kitchen triamterene-hydrochlorothiazide (MAXZIDE) 75-50 MG tablet Take 1 tablet by mouth every evening.     . Vitamin D, Cholecalciferol, 1000 UNITS TABS Take 1,000 Units by mouth daily.     Marland Kitchen albuterol (PROVENTIL HFA;VENTOLIN HFA) 108 (90 BASE) MCG/ACT inhaler Inhale into the lungs every 6 (six) hours as needed for wheezing or shortness of breath.    Marland Kitchen albuterol (PROVENTIL) (2.5 MG/3ML) 0.083% nebulizer solution Take 2.5 mg by nebulization every 6 (six) hours as needed for wheezing or shortness of breath.    Marland Kitchen DIHYDROERGOTAMINE MESYLATE NA Inject 2 mLs into the muscle daily as needed (for migraine headaches).     Marland Kitchen HYDROcodone-acetaminophen (NORCO/VICODIN) 5-325 MG tablet Take 1-2 tablets by mouth every 6 (six) hours as needed. (Patient not taking: Reported on 06/24/2016) 20 tablet 0  . promethazine (PHENERGAN) 25 MG tablet Take 25 mg by mouth every 6 (six) hours as needed for nausea or vomiting.     No current facility-administered medications for this encounter.     Physical Findings: The patient is in no acute distress. Patient is alert and oriented.  height is 5' 3.5" (1.613 m) and weight is 157 lb 3.2 oz (71.3 kg). Her oral temperature is 98.2 F (36.8  C). Her blood pressure is 150/72 (abnormal) and her pulse is 64. Her oxygen saturation is 98%. .  No significant changes. Lungs are clear to auscultation bilaterally. Heart has regular rate and rhythm. No palpable cervical, supraclavicular, or axillary adenopathy. Abdomen soft, non-tender, normal bowel sounds. In the right breast, patient has a scar in the upper aspect from pacemaker translocation, there is a palpable pacemaker in right upper chest region. Left breast shows scar in upper aspect of chest from pacemaker removal . In the 2:30 position of the left breast, patient has a scar from partial mastectomy which is healing well without drainage or infection.  Central left breast shows erythema and possible infection.  Lab Findings: Lab Results  Component Value Date   WBC 5.2 05/30/2016   HGB 13.6 05/30/2016   HCT 40.1 05/30/2016   MCV 86.1 05/30/2016   PLT 334 05/30/2016    Radiographic Findings: No results found.  Impression:  Stage pT1a pN0 Left Breast UOQ Invasive Ductal Carcinoma, ER- / PR- / Her2+, Grade 2. Patient's tumor is ER/PR -, therefore would not benefit from adjuvant hormonal therapy. I would recommend adjuvant radiation therapy as part of her post operative follow up. We discussed the course of treatment, side effects, and potential toxicities. She appears to understand and wishes to proceed with treatment. A consent form was signed and a copy was placed in the patient's chart.  Plan: CT simulation and treatment planning is scheduled for 07/08/16 at 3 pm. Anticipate treatment to begin 6 weeks post-op. I will try to contact Dr. Alphonsa Overall about possibly starting antibiotic therapy concerning questionable cellulitis.  ____________________________________    This document serves as a record of services personally performed by Gery Pray, MD. It was created on his behalf by Bethann Humble, a trained medical scribe. The creation of this record is based on the scribe's personal observations and the provider's statements to them. This document has been checked and approved by the attending provider.

## 2016-06-30 NOTE — Progress Notes (Signed)
Please see the Nurse Progress Note in the MD Initial Consult Encounter for this patient. 

## 2016-06-30 NOTE — Addendum Note (Signed)
Encounter addended by: Gery Pray, MD on: 06/30/2016  6:32 PM<BR>    Actions taken: Order list changed

## 2016-07-01 ENCOUNTER — Telehealth: Payer: Self-pay | Admitting: Oncology

## 2016-07-01 NOTE — Telephone Encounter (Signed)
Called [name] and notified her that a prescription for doxycycline has been sent to [facility] and should be ready for pick up.  [name] verbalized understanding and agreement.

## 2016-07-06 DIAGNOSIS — J0100 Acute maxillary sinusitis, unspecified: Secondary | ICD-10-CM | POA: Diagnosis not present

## 2016-07-08 ENCOUNTER — Ambulatory Visit: Payer: Medicare HMO | Admitting: Radiation Oncology

## 2016-07-08 DIAGNOSIS — C50919 Malignant neoplasm of unspecified site of unspecified female breast: Secondary | ICD-10-CM | POA: Diagnosis not present

## 2016-07-08 DIAGNOSIS — R05 Cough: Secondary | ICD-10-CM | POA: Diagnosis not present

## 2016-07-10 ENCOUNTER — Ambulatory Visit
Admission: RE | Admit: 2016-07-10 | Discharge: 2016-07-10 | Disposition: A | Discharge status: Home / Self Care | Payer: Medicare HMO | Source: Ambulatory Visit | Attending: Radiation Oncology | Admitting: Radiation Oncology

## 2016-07-10 DIAGNOSIS — Z171 Estrogen receptor negative status [ER-]: Secondary | ICD-10-CM | POA: Diagnosis not present

## 2016-07-10 DIAGNOSIS — Z79899 Other long term (current) drug therapy: Secondary | ICD-10-CM | POA: Diagnosis not present

## 2016-07-10 DIAGNOSIS — T814XXA Infection following a procedure, initial encounter: Secondary | ICD-10-CM | POA: Diagnosis not present

## 2016-07-10 DIAGNOSIS — C50412 Malignant neoplasm of upper-outer quadrant of left female breast: Secondary | ICD-10-CM | POA: Diagnosis not present

## 2016-07-10 DIAGNOSIS — Z882 Allergy status to sulfonamides status: Secondary | ICD-10-CM | POA: Diagnosis not present

## 2016-07-10 DIAGNOSIS — Z510 Encounter for antineoplastic radiation therapy: Secondary | ICD-10-CM | POA: Diagnosis not present

## 2016-07-10 DIAGNOSIS — Z950 Presence of cardiac pacemaker: Secondary | ICD-10-CM | POA: Diagnosis not present

## 2016-07-10 DIAGNOSIS — Z880 Allergy status to penicillin: Secondary | ICD-10-CM | POA: Diagnosis not present

## 2016-07-10 DIAGNOSIS — Z888 Allergy status to other drugs, medicaments and biological substances status: Secondary | ICD-10-CM | POA: Diagnosis not present

## 2016-07-13 NOTE — Progress Notes (Signed)
  Radiation Oncology         [phone] ________________________________  Name: [name] MRN: [id number]  Date: 07/10/2016  DOB: 1945-10-25  SIMULATION AND TREATMENT PLANNING NOTE    ICD-9-CM ICD-10-CM   1. Malignant neoplasm of upper-outer quadrant of left breast in female, estrogen receptor negative ([facility]) 174.4 C50.412    V86.1 Z17.1     DIAGNOSIS: Stage pT1a, p N0 LeftBreast UOQ Invasive Ductal Carcinoma, ER-/ PR-/ Her2+, Grade 1-2  NARRATIVE:  The patient was brought to the [place].  Identity was confirmed.  All relevant records and images related to the planned course of therapy were reviewed.  The patient freely provided informed written consent to proceed with treatment after reviewing the details related to the planned course of therapy. The consent form was witnessed and verified by the simulation staff.  Then, the patient was set-up in a stable reproducible  supine position for radiation therapy.  CT images were obtained.  Surface markings were placed.  The CT images were loaded into the planning software.  Then the target and avoidance structures were contoured.  Treatment planning then occurred.  The radiation prescription was entered and confirmed.  Then, I designed and supervised the construction of a total of 3 medically necessary complex treatment devices.  I have requested : 3D Simulation  I have requested a DVH of the following structures: heart, lungs, lumpectomy cavity, pacemaker.  I have ordered:dose calc.  PLAN:  The patient will receive 50.4 Gy in 28 fractions unless she is a candidate for hypofractionated accelerated treatment based on upcoming planning.  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    -----------------------------------  Blair Promise, PhD, MD

## 2016-07-16 DIAGNOSIS — Z171 Estrogen receptor negative status [ER-]: Secondary | ICD-10-CM | POA: Diagnosis not present

## 2016-07-16 DIAGNOSIS — C50412 Malignant neoplasm of upper-outer quadrant of left female breast: Secondary | ICD-10-CM | POA: Diagnosis not present

## 2016-07-16 DIAGNOSIS — Z950 Presence of cardiac pacemaker: Secondary | ICD-10-CM | POA: Diagnosis not present

## 2016-07-16 DIAGNOSIS — Z79899 Other long term (current) drug therapy: Secondary | ICD-10-CM | POA: Diagnosis not present

## 2016-07-16 DIAGNOSIS — Z888 Allergy status to other drugs, medicaments and biological substances status: Secondary | ICD-10-CM | POA: Diagnosis not present

## 2016-07-16 DIAGNOSIS — Z510 Encounter for antineoplastic radiation therapy: Secondary | ICD-10-CM | POA: Diagnosis not present

## 2016-07-16 DIAGNOSIS — Z882 Allergy status to sulfonamides status: Secondary | ICD-10-CM | POA: Diagnosis not present

## 2016-07-16 DIAGNOSIS — Z880 Allergy status to penicillin: Secondary | ICD-10-CM | POA: Diagnosis not present

## 2016-07-17 DIAGNOSIS — Z853 Personal history of malignant neoplasm of breast: Secondary | ICD-10-CM | POA: Diagnosis not present

## 2016-07-18 ENCOUNTER — Ambulatory Visit: Payer: Medicare HMO | Admitting: Radiation Oncology

## 2016-07-18 ENCOUNTER — Ambulatory Visit: Payer: Medicare HMO

## 2016-07-21 ENCOUNTER — Ambulatory Visit: Payer: Medicare HMO

## 2016-07-22 ENCOUNTER — Other Ambulatory Visit: Payer: Self-pay | Admitting: General Surgery

## 2016-07-22 ENCOUNTER — Ambulatory Visit: Payer: Medicare HMO

## 2016-07-22 DIAGNOSIS — T814XXA Infection following a procedure, initial encounter: Principal | ICD-10-CM

## 2016-07-22 DIAGNOSIS — IMO0001 Reserved for inherently not codable concepts without codable children: Secondary | ICD-10-CM

## 2016-07-23 ENCOUNTER — Ambulatory Visit: Payer: Medicare HMO

## 2016-07-24 ENCOUNTER — Ambulatory Visit: Payer: Medicare HMO

## 2016-07-25 ENCOUNTER — Ambulatory Visit: Payer: Medicare HMO

## 2016-07-25 DIAGNOSIS — R05 Cough: Secondary | ICD-10-CM | POA: Diagnosis not present

## 2016-07-25 DIAGNOSIS — H6120 Impacted cerumen, unspecified ear: Secondary | ICD-10-CM | POA: Diagnosis not present

## 2016-07-28 ENCOUNTER — Ambulatory Visit: Payer: Medicare HMO

## 2016-07-28 ENCOUNTER — Ambulatory Visit: Payer: Medicare HMO | Admitting: Radiation Oncology

## 2016-07-29 ENCOUNTER — Ambulatory Visit: Payer: Medicare HMO | Admitting: Radiation Oncology

## 2016-07-29 ENCOUNTER — Ambulatory Visit: Payer: Medicare HMO

## 2016-07-30 ENCOUNTER — Encounter: Payer: Self-pay | Admitting: Radiation Oncology

## 2016-07-30 ENCOUNTER — Ambulatory Visit: Payer: Medicare HMO

## 2016-07-31 ENCOUNTER — Ambulatory Visit: Payer: Medicare HMO

## 2016-07-31 ENCOUNTER — Encounter: Payer: Self-pay | Admitting: Radiation Oncology

## 2016-08-01 ENCOUNTER — Ambulatory Visit: Payer: Medicare HMO

## 2016-08-01 ENCOUNTER — Encounter: Payer: Self-pay | Admitting: Radiation Oncology

## 2016-08-04 ENCOUNTER — Ambulatory Visit
Admission: RE | Admit: 2016-08-04 | Discharge: 2016-08-04 | Disposition: A | Discharge status: Home / Self Care | Payer: Medicare HMO | Source: Ambulatory Visit | Attending: Radiation Oncology | Admitting: Radiation Oncology

## 2016-08-04 DIAGNOSIS — Z510 Encounter for antineoplastic radiation therapy: Secondary | ICD-10-CM | POA: Diagnosis not present

## 2016-08-04 DIAGNOSIS — Z171 Estrogen receptor negative status [ER-]: Secondary | ICD-10-CM | POA: Diagnosis not present

## 2016-08-04 DIAGNOSIS — Z880 Allergy status to penicillin: Secondary | ICD-10-CM | POA: Diagnosis not present

## 2016-08-04 DIAGNOSIS — Z79899 Other long term (current) drug therapy: Secondary | ICD-10-CM | POA: Diagnosis not present

## 2016-08-04 DIAGNOSIS — Z950 Presence of cardiac pacemaker: Secondary | ICD-10-CM | POA: Diagnosis not present

## 2016-08-04 DIAGNOSIS — Z888 Allergy status to other drugs, medicaments and biological substances status: Secondary | ICD-10-CM | POA: Diagnosis not present

## 2016-08-04 DIAGNOSIS — Z882 Allergy status to sulfonamides status: Secondary | ICD-10-CM | POA: Diagnosis not present

## 2016-08-04 DIAGNOSIS — C50412 Malignant neoplasm of upper-outer quadrant of left female breast: Secondary | ICD-10-CM | POA: Diagnosis not present

## 2016-08-05 ENCOUNTER — Ambulatory Visit
Admission: RE | Admit: 2016-08-05 | Discharge: 2016-08-05 | Disposition: A | Discharge status: Home / Self Care | Payer: Medicare HMO | Source: Ambulatory Visit | Attending: Radiation Oncology | Admitting: Radiation Oncology

## 2016-08-05 ENCOUNTER — Encounter: Payer: Self-pay | Admitting: Radiation Oncology

## 2016-08-05 ENCOUNTER — Ambulatory Visit: Payer: Medicare HMO

## 2016-08-05 VITALS — BP 128/72 | HR 60 | Temp 98.2°F | Resp 18 | Wt 161.2 lb

## 2016-08-05 DIAGNOSIS — Z882 Allergy status to sulfonamides status: Secondary | ICD-10-CM | POA: Diagnosis not present

## 2016-08-05 DIAGNOSIS — C50412 Malignant neoplasm of upper-outer quadrant of left female breast: Secondary | ICD-10-CM | POA: Diagnosis not present

## 2016-08-05 DIAGNOSIS — Z510 Encounter for antineoplastic radiation therapy: Secondary | ICD-10-CM | POA: Diagnosis not present

## 2016-08-05 DIAGNOSIS — Z888 Allergy status to other drugs, medicaments and biological substances status: Secondary | ICD-10-CM | POA: Diagnosis not present

## 2016-08-05 DIAGNOSIS — Z171 Estrogen receptor negative status [ER-]: Secondary | ICD-10-CM | POA: Diagnosis not present

## 2016-08-05 DIAGNOSIS — Z79899 Other long term (current) drug therapy: Secondary | ICD-10-CM | POA: Diagnosis not present

## 2016-08-05 DIAGNOSIS — Z880 Allergy status to penicillin: Secondary | ICD-10-CM | POA: Diagnosis not present

## 2016-08-05 DIAGNOSIS — Z950 Presence of cardiac pacemaker: Secondary | ICD-10-CM | POA: Diagnosis not present

## 2016-08-05 NOTE — Progress Notes (Addendum)
[name] is here for her first treatment today.  Has not had treatment yet because her Surgeon wanted her to see Dr. Sondra Come prior to treatment. She has been dealing with reoccurent infection to left breast that she had taken 5 different antibiotics, fluid has been taken off surgical site.  Patient states that she has a great deal of pain the left shoulder, breast, back and arm. Has only been taking Tylenol.  Was questioning what additional pain management stuff she could use. Patient denies any noticeable swelling.  Ultrasounds were completed to left breast to check fluid levels.    Patient reports fatigue.  Appetite is maintaining but taste has changed.  Skin is warm and red under left breast.  Vitals:   08/05/16 1409  BP: 128/72  Pulse: 60  Resp: 18  Temp: 98.2 F (36.8 C)  TempSrc: Oral  SpO2: 97%  Weight: 161 lb 3.2 oz (73.1 kg)    Wt Readings from Last 3 Encounters:  08/05/16 161 lb 3.2 oz (73.1 kg)  06/30/16 157 lb 3.2 oz (71.3 kg)  06/24/16 156 lb (70.8 kg)

## 2016-08-05 NOTE — Progress Notes (Signed)
  Radiation Oncology         [phone] ________________________________  Name: [name] MRN: [id number]  Date: 08/05/2016  DOB: 27-Sep-1945  Weekly Radiation Therapy Management    ICD-9-CM ICD-10-CM   1. Malignant neoplasm of upper-outer quadrant of left breast in female, estrogen receptor negative ([facility]) 174.4 C50.412    V86.1 Z17.1      Current Dose: 1.8 Gy     Planned Dose:  50.4 Gy  Narrative . . . . . . . . The patient presents for routine under treatment assessment.                                    [name] is here for her first treatment today.  Has not had treatment yet because her surgeon, Dr. Lucia Gaskins, wanted her to see Dr. Sondra Come prior to treatment. She has been dealing with a reoccurent infection to the left breast that she had taken 5 different antibiotics, fluid has been taken off the surgical site.  Patient states that she has a great deal of pain in the the left shoulder, breast, back, and arm. Has only been taking Tylenol.  Was questioning what additional pain management medications or techniques she could use. Patient denies any noticeable swelling, fever, or chills.  Ultrasounds were completed to left breast to check fluid levels. Patient reports fatigue.  Appetite is maintaining but taste has changed.                                  Set-up films were reviewed.                                 The chart was checked. Physical Findings. . .  weight is 161 lb 3.2 oz (73.1 kg). Her oral temperature is 98.2 F (36.8 C). Her blood pressure is 128/72 and her pulse is 60. Her respiration is 18 and oxygen saturation is 97%. . Bilateral expiratory wheezing. Heart has regular rate and rhythm. Left breast showed continued erythema in the lower potion of the left breast. Seroma along her lumpectomy scar 3 x 4 cm in size. No significant infection noted Impression . . . . . . Marland Kitchen Appropriate to begin treatment. Plan . . . . . . . . . . . . Continue treatment as planned. She will  complete her current course of antibiotic therapy ________________________________   Blair Promise, PhD, MD  This document serves as a record of services personally performed by Gery Pray, MD. It was created on his behalf by Darcus Austin, a trained medical scribe. The creation of this record is based on the scribe's personal observations and the provider's statements to them. This document has been checked and approved by the attending provider.

## 2016-08-06 ENCOUNTER — Ambulatory Visit: Payer: Medicare HMO

## 2016-08-06 ENCOUNTER — Ambulatory Visit
Admission: RE | Admit: 2016-08-06 | Discharge: 2016-08-06 | Disposition: A | Discharge status: Home / Self Care | Payer: Medicare HMO | Source: Ambulatory Visit | Attending: Radiation Oncology | Admitting: Radiation Oncology

## 2016-08-06 DIAGNOSIS — C50412 Malignant neoplasm of upper-outer quadrant of left female breast: Secondary | ICD-10-CM | POA: Insufficient documentation

## 2016-08-06 DIAGNOSIS — Z79899 Other long term (current) drug therapy: Secondary | ICD-10-CM | POA: Diagnosis not present

## 2016-08-06 DIAGNOSIS — Z882 Allergy status to sulfonamides status: Secondary | ICD-10-CM | POA: Diagnosis not present

## 2016-08-06 DIAGNOSIS — Z880 Allergy status to penicillin: Secondary | ICD-10-CM | POA: Diagnosis not present

## 2016-08-06 DIAGNOSIS — Z950 Presence of cardiac pacemaker: Secondary | ICD-10-CM | POA: Diagnosis not present

## 2016-08-06 DIAGNOSIS — Z171 Estrogen receptor negative status [ER-]: Secondary | ICD-10-CM | POA: Insufficient documentation

## 2016-08-06 DIAGNOSIS — Z888 Allergy status to other drugs, medicaments and biological substances status: Secondary | ICD-10-CM | POA: Diagnosis not present

## 2016-08-06 DIAGNOSIS — Z510 Encounter for antineoplastic radiation therapy: Secondary | ICD-10-CM | POA: Diagnosis not present

## 2016-08-06 MED ORDER — ALRA NON-METALLIC DEODORANT (RAD-ONC)
1.0000 "application " | Freq: Once | TOPICAL | Status: AC
  Administered 2016-08-06: 1 via TOPICAL

## 2016-08-06 MED ORDER — RADIAPLEXRX EX GEL
Freq: Once | CUTANEOUS | Status: AC
  Administered 2016-08-06: 15:00:00 via TOPICAL

## 2016-08-06 NOTE — Progress Notes (Signed)
Pt here for patient teaching.  Pt given Radiation and You booklet, skin care instructions, Alra deodorant and Radiaplex gel.  Reviewed areas of pertinence such as fatigue, skin changes, breast tenderness and breast swelling . Pt able to give teach back of to pat skin and use unscented/gentle soap,apply Radiaplex bid, avoid applying anything to skin within 4 hours of treatment and to use an electric razor if they must shave. Pt demonstrated understanding and verbalizes understanding of information given and will contact nursing with any questions or concerns.          

## 2016-08-07 ENCOUNTER — Ambulatory Visit: Payer: Medicare HMO

## 2016-08-07 ENCOUNTER — Ambulatory Visit
Admission: RE | Admit: 2016-08-07 | Discharge: 2016-08-07 | Disposition: A | Discharge status: Home / Self Care | Payer: Medicare HMO | Source: Ambulatory Visit | Attending: Radiation Oncology | Admitting: Radiation Oncology

## 2016-08-07 DIAGNOSIS — Z171 Estrogen receptor negative status [ER-]: Secondary | ICD-10-CM | POA: Diagnosis not present

## 2016-08-07 DIAGNOSIS — Z510 Encounter for antineoplastic radiation therapy: Secondary | ICD-10-CM | POA: Diagnosis not present

## 2016-08-07 DIAGNOSIS — Z79899 Other long term (current) drug therapy: Secondary | ICD-10-CM | POA: Diagnosis not present

## 2016-08-07 DIAGNOSIS — Z882 Allergy status to sulfonamides status: Secondary | ICD-10-CM | POA: Diagnosis not present

## 2016-08-07 DIAGNOSIS — Z880 Allergy status to penicillin: Secondary | ICD-10-CM | POA: Diagnosis not present

## 2016-08-07 DIAGNOSIS — Z950 Presence of cardiac pacemaker: Secondary | ICD-10-CM | POA: Diagnosis not present

## 2016-08-07 DIAGNOSIS — Z888 Allergy status to other drugs, medicaments and biological substances status: Secondary | ICD-10-CM | POA: Diagnosis not present

## 2016-08-07 DIAGNOSIS — C50412 Malignant neoplasm of upper-outer quadrant of left female breast: Secondary | ICD-10-CM | POA: Diagnosis not present

## 2016-08-08 ENCOUNTER — Ambulatory Visit: Payer: Medicare HMO

## 2016-08-08 ENCOUNTER — Ambulatory Visit
Admission: RE | Admit: 2016-08-08 | Discharge: 2016-08-08 | Disposition: A | Discharge status: Home / Self Care | Payer: Medicare HMO | Source: Ambulatory Visit | Attending: Radiation Oncology | Admitting: Radiation Oncology

## 2016-08-08 DIAGNOSIS — Z510 Encounter for antineoplastic radiation therapy: Secondary | ICD-10-CM | POA: Diagnosis not present

## 2016-08-08 DIAGNOSIS — M791 Myalgia: Secondary | ICD-10-CM | POA: Diagnosis not present

## 2016-08-08 DIAGNOSIS — M542 Cervicalgia: Secondary | ICD-10-CM | POA: Diagnosis not present

## 2016-08-08 DIAGNOSIS — Z882 Allergy status to sulfonamides status: Secondary | ICD-10-CM | POA: Diagnosis not present

## 2016-08-08 DIAGNOSIS — R05 Cough: Secondary | ICD-10-CM | POA: Diagnosis not present

## 2016-08-08 DIAGNOSIS — Z950 Presence of cardiac pacemaker: Secondary | ICD-10-CM | POA: Diagnosis not present

## 2016-08-08 DIAGNOSIS — Z171 Estrogen receptor negative status [ER-]: Secondary | ICD-10-CM | POA: Diagnosis not present

## 2016-08-08 DIAGNOSIS — Z79899 Other long term (current) drug therapy: Secondary | ICD-10-CM | POA: Diagnosis not present

## 2016-08-08 DIAGNOSIS — C50412 Malignant neoplasm of upper-outer quadrant of left female breast: Secondary | ICD-10-CM | POA: Diagnosis not present

## 2016-08-08 DIAGNOSIS — Z888 Allergy status to other drugs, medicaments and biological substances status: Secondary | ICD-10-CM | POA: Diagnosis not present

## 2016-08-08 DIAGNOSIS — Z880 Allergy status to penicillin: Secondary | ICD-10-CM | POA: Diagnosis not present

## 2016-08-11 ENCOUNTER — Ambulatory Visit: Payer: Medicare HMO

## 2016-08-11 ENCOUNTER — Encounter: Payer: Self-pay | Admitting: Radiation Oncology

## 2016-08-12 ENCOUNTER — Ambulatory Visit
Admission: RE | Admit: 2016-08-12 | Discharge: 2016-08-12 | Disposition: A | Discharge status: Home / Self Care | Payer: Medicare HMO | Source: Ambulatory Visit | Attending: Radiation Oncology | Admitting: Radiation Oncology

## 2016-08-12 ENCOUNTER — Ambulatory Visit: Payer: Medicare HMO

## 2016-08-12 VITALS — BP 131/79 | HR 68 | Temp 97.6°F | Resp 18 | Wt 159.4 lb

## 2016-08-12 DIAGNOSIS — Z888 Allergy status to other drugs, medicaments and biological substances status: Secondary | ICD-10-CM | POA: Diagnosis not present

## 2016-08-12 DIAGNOSIS — Z171 Estrogen receptor negative status [ER-]: Secondary | ICD-10-CM | POA: Diagnosis not present

## 2016-08-12 DIAGNOSIS — Z882 Allergy status to sulfonamides status: Secondary | ICD-10-CM | POA: Diagnosis not present

## 2016-08-12 DIAGNOSIS — Z950 Presence of cardiac pacemaker: Secondary | ICD-10-CM | POA: Diagnosis not present

## 2016-08-12 DIAGNOSIS — Z79899 Other long term (current) drug therapy: Secondary | ICD-10-CM | POA: Diagnosis not present

## 2016-08-12 DIAGNOSIS — Z510 Encounter for antineoplastic radiation therapy: Secondary | ICD-10-CM | POA: Diagnosis not present

## 2016-08-12 DIAGNOSIS — Z880 Allergy status to penicillin: Secondary | ICD-10-CM | POA: Diagnosis not present

## 2016-08-12 DIAGNOSIS — C50412 Malignant neoplasm of upper-outer quadrant of left female breast: Secondary | ICD-10-CM | POA: Diagnosis not present

## 2016-08-12 MED ORDER — HYDROCODONE-ACETAMINOPHEN 5-325 MG PO TABS: 1.0000 | ORAL_TABLET | Freq: Four times a day (QID) | ORAL | 0 refills | Status: DC | PRN

## 2016-08-12 NOTE — Progress Notes (Signed)
  Radiation Oncology         [phone] ________________________________  Name: [name] MRN: [id number]  Date: 08/12/2016  DOB: [birth year]  Weekly Radiation Therapy Management    ICD-9-CM ICD-10-CM   1. Malignant neoplasm of upper-outer quadrant of left breast in female, estrogen receptor negative ([facility]) 174.4 C50.412 Ambulatory referral to Physical Therapy   V86.1 Z17.1      Current Dose: 9 Gy     Planned Dose:  50.4 Gy  Narrative . . . . . . . . The patient presents for routine under treatment assessment.                                    [name] has completed 5 fractions to the left breast. She reports fatigue. She reports pain 10/10 in severity which started in the left shoulder and now radiates down right shoulder and arm. Lying flat exacerbates the patient's pain. She reports she does not have any pain medication to take, and is unable to take NSAIDS. She states she is unable to even lift her purse. She reports that Dr. Rex Kras tested her for fibromyalgia, and was negative. The patient reports she has little desire to eat. She is using Radiaplex twice daily as directed.                                   Set-up films were reviewed.                                 The chart was checked. Physical Findings. . .  weight is 159 lb 6.4 oz (72.3 kg). Her oral temperature is 97.6 F (36.4 C). Her blood pressure is 131/79 and her pulse is 68. Her respiration is 18 and oxygen saturation is 98%.  Bilateral expiratory wheezing. Heart has regular rate and rhythm. Some erythema along inferior aspect of the left breast. She has difficulty abducting her arm beyond 80 degrees. Impression . . . . . . Marland Kitchen Appropriate to continue treatment. Plan . . . . . . . . . . . . Continue treatment as planned. I will prescribe Hydrocodone for pain. The patient will be referred to PT for her left shoulder pain. Patient mentioned this pain started after her surgery and has gradually gotten worse.  She denies  any trauma to her shoulder.  ________________________________   Blair Promise, PhD, MD  This document serves as a record of services personally performed by Gery Pray, MD. It was created on his behalf by Maryla Morrow, a trained medical scribe. The creation of this record is based on the scribe's personal observations and the provider's statements to them. This document has been checked and approved by the attending provider.

## 2016-08-12 NOTE — Progress Notes (Addendum)
[name] completed 5th fraction to left breast.  Patient reports pain 10 out of 10 that has started in left shoulder, arm and now radiating down right shoulder and arm.  Patient states she does not have any pain medications ordered to take.  She is unable to take NSAIDS.  She states she is unable to even lift her purse.  Patient stated that Dr. Rex Kras tested her for fibromyaligia and it was negative on blood work.  Patient states that she has little desire to eat.  She states she is fatigued and the pain interferes with her ability to do much.  She is requesting something for pain whether it be medication or PT to help with gaining movement and functionality of arms.  Skin is pink on left breast.  She is using radiaplex twice daily.    Vitals:   08/12/16 1502  BP: 131/79  Pulse: 68  Resp: 18  Temp: 97.6 F (36.4 C)  TempSrc: Oral  SpO2: 98%  Weight: 159 lb 6.4 oz (72.3 kg)    Wt Readings from Last 3 Encounters:  08/12/16 159 lb 6.4 oz (72.3 kg)  08/05/16 161 lb 3.2 oz (73.1 kg)  06/30/16 157 lb 3.2 oz (71.3 kg)

## 2016-08-13 ENCOUNTER — Ambulatory Visit: Payer: Medicare HMO

## 2016-08-13 ENCOUNTER — Ambulatory Visit
Admission: RE | Admit: 2016-08-13 | Discharge: 2016-08-13 | Disposition: A | Discharge status: Home / Self Care | Payer: Medicare HMO | Source: Ambulatory Visit | Attending: Radiation Oncology | Admitting: Radiation Oncology

## 2016-08-13 DIAGNOSIS — Z171 Estrogen receptor negative status [ER-]: Secondary | ICD-10-CM | POA: Diagnosis not present

## 2016-08-13 DIAGNOSIS — Z888 Allergy status to other drugs, medicaments and biological substances status: Secondary | ICD-10-CM | POA: Diagnosis not present

## 2016-08-13 DIAGNOSIS — Z882 Allergy status to sulfonamides status: Secondary | ICD-10-CM | POA: Diagnosis not present

## 2016-08-13 DIAGNOSIS — Z950 Presence of cardiac pacemaker: Secondary | ICD-10-CM | POA: Diagnosis not present

## 2016-08-13 DIAGNOSIS — Z79899 Other long term (current) drug therapy: Secondary | ICD-10-CM | POA: Diagnosis not present

## 2016-08-13 DIAGNOSIS — Z880 Allergy status to penicillin: Secondary | ICD-10-CM | POA: Diagnosis not present

## 2016-08-13 DIAGNOSIS — C50412 Malignant neoplasm of upper-outer quadrant of left female breast: Secondary | ICD-10-CM | POA: Diagnosis not present

## 2016-08-13 DIAGNOSIS — Z510 Encounter for antineoplastic radiation therapy: Secondary | ICD-10-CM | POA: Diagnosis not present

## 2016-08-14 ENCOUNTER — Ambulatory Visit
Admission: RE | Admit: 2016-08-14 | Discharge: 2016-08-14 | Disposition: A | Discharge status: Home / Self Care | Payer: Medicare HMO | Source: Ambulatory Visit | Attending: Radiation Oncology | Admitting: Radiation Oncology

## 2016-08-14 ENCOUNTER — Ambulatory Visit: Payer: Medicare HMO

## 2016-08-14 DIAGNOSIS — Z171 Estrogen receptor negative status [ER-]: Secondary | ICD-10-CM | POA: Diagnosis not present

## 2016-08-14 DIAGNOSIS — Z888 Allergy status to other drugs, medicaments and biological substances status: Secondary | ICD-10-CM | POA: Diagnosis not present

## 2016-08-14 DIAGNOSIS — Z882 Allergy status to sulfonamides status: Secondary | ICD-10-CM | POA: Diagnosis not present

## 2016-08-14 DIAGNOSIS — C50412 Malignant neoplasm of upper-outer quadrant of left female breast: Secondary | ICD-10-CM | POA: Diagnosis not present

## 2016-08-14 DIAGNOSIS — Z79899 Other long term (current) drug therapy: Secondary | ICD-10-CM | POA: Diagnosis not present

## 2016-08-14 DIAGNOSIS — Z950 Presence of cardiac pacemaker: Secondary | ICD-10-CM | POA: Diagnosis not present

## 2016-08-14 DIAGNOSIS — Z510 Encounter for antineoplastic radiation therapy: Secondary | ICD-10-CM | POA: Diagnosis not present

## 2016-08-14 DIAGNOSIS — Z880 Allergy status to penicillin: Secondary | ICD-10-CM | POA: Diagnosis not present

## 2016-08-15 ENCOUNTER — Encounter: Payer: Self-pay | Admitting: Physical Therapy

## 2016-08-15 ENCOUNTER — Ambulatory Visit: Payer: Medicare HMO | Attending: Radiation Oncology | Admitting: Physical Therapy

## 2016-08-15 ENCOUNTER — Ambulatory Visit: Payer: Medicare HMO

## 2016-08-15 ENCOUNTER — Ambulatory Visit
Admission: RE | Admit: 2016-08-15 | Discharge: 2016-08-15 | Disposition: A | Discharge status: Home / Self Care | Payer: Medicare HMO | Source: Ambulatory Visit | Attending: Radiation Oncology | Admitting: Radiation Oncology

## 2016-08-15 DIAGNOSIS — Z171 Estrogen receptor negative status [ER-]: Secondary | ICD-10-CM | POA: Diagnosis not present

## 2016-08-15 DIAGNOSIS — Z882 Allergy status to sulfonamides status: Secondary | ICD-10-CM | POA: Diagnosis not present

## 2016-08-15 DIAGNOSIS — M25611 Stiffness of right shoulder, not elsewhere classified: Secondary | ICD-10-CM | POA: Diagnosis present

## 2016-08-15 DIAGNOSIS — M6281 Muscle weakness (generalized): Secondary | ICD-10-CM | POA: Insufficient documentation

## 2016-08-15 DIAGNOSIS — Z880 Allergy status to penicillin: Secondary | ICD-10-CM | POA: Diagnosis not present

## 2016-08-15 DIAGNOSIS — M25512 Pain in left shoulder: Secondary | ICD-10-CM

## 2016-08-15 DIAGNOSIS — Z888 Allergy status to other drugs, medicaments and biological substances status: Secondary | ICD-10-CM | POA: Diagnosis not present

## 2016-08-15 DIAGNOSIS — M25511 Pain in right shoulder: Secondary | ICD-10-CM | POA: Insufficient documentation

## 2016-08-15 DIAGNOSIS — C50412 Malignant neoplasm of upper-outer quadrant of left female breast: Secondary | ICD-10-CM | POA: Diagnosis not present

## 2016-08-15 DIAGNOSIS — Z79899 Other long term (current) drug therapy: Secondary | ICD-10-CM | POA: Diagnosis not present

## 2016-08-15 DIAGNOSIS — R600 Localized edema: Secondary | ICD-10-CM | POA: Diagnosis present

## 2016-08-15 DIAGNOSIS — Z510 Encounter for antineoplastic radiation therapy: Secondary | ICD-10-CM | POA: Diagnosis not present

## 2016-08-15 DIAGNOSIS — M25612 Stiffness of left shoulder, not elsewhere classified: Secondary | ICD-10-CM | POA: Diagnosis present

## 2016-08-15 DIAGNOSIS — Z950 Presence of cardiac pacemaker: Secondary | ICD-10-CM | POA: Diagnosis not present

## 2016-08-15 NOTE — Therapy (Signed)
[place], [place], [address] Phone: [phone]   Fax:  [phone]  Physical Therapy Evaluation  Patient Details  Name: [name] MRN: [id number] Date of Birth: [birth year] Referring Provider: Dr. Sondra Come  Encounter Date: 08/15/2016      PT End of Session - 08/15/16 0855    Visit Number 1   Number of Visits 9   Date for PT Re-Evaluation 09/12/16   PT Start Time 0801   PT Stop Time 4818   PT Time Calculation (min) 43 min   Activity Tolerance Patient tolerated treatment well   Behavior During Therapy [facility] for tasks assessed/performed      Past Medical History:  Diagnosis Date  . Allergic rhinitis   . Arthritis   . Family history of breast cancer   . Family history of colon cancer   . Family history of pancreatic cancer   . GERD (gastroesophageal reflux disease)   . GI bleed   . History of basal cell cancer   . Hyperlipidemia   . Hypertension   . Hypothyroidism   . Lumbar disc disease   . Malignant neoplasm of upper-outer quadrant of left female breast ([place]) 04/11/2016  . Pacemaker   . Peptic ulcer disease   . SSS (sick sinus syndrome) ([place])   . Thyroid disease     Past Surgical History:  Procedure Laterality Date  . ABDOMINAL HYSTERECTOMY    . BREAST LUMPECTOMY WITH RADIOACTIVE SEED AND SENTINEL LYMPH NODE BIOPSY Left 06/06/2016   Procedure: LEFT BREAST LUMPECTOMY WITH RADIOACTIVE SEED AND LEFT AXILLARY SENTINEL LYMPH NODE BIOPSY;  Surgeon: Alphonsa Overall, MD;  Location: [place];  Service: General;  Laterality: Left;  . BREAST SURGERY     left  . EP IMPLANTABLE DEVICE N/A 05/15/2016   Procedure: PPM Generator Changeout;  Surgeon: Evans Lance, MD;  Location: [place] [facility];  Service: Cardiovascular;  Laterality: N/A;  . HERNIA REPAIR    . PACEMAKER INSERTION      There were no vitals filed for this visit.       Subjective Assessment - 08/15/16 0804    Subjective I am  having trouble with range of motion on both sides but the left is more painful than the right. I had an infection in my left breast after my lumpectomy. They had to draw out fluid twice a week. It delayed my radiation by three weeks.   Pertinent History Pacemaker replaced in Dec 2017, left lumpectomy on 06/06/16, currently undergoing radiation, does not need chemotherapy   Patient Stated Goals to be in less pain and to be able to do normal things like lifting my purse and carrying groceries, to get more sleep at night because the pain in my left shoulder keeps me up at night   Currently in Pain? Yes   Pain Score 9    Pain Location Shoulder   Pain Orientation Left   Pain Descriptors / Indicators Throbbing   Pain Type Surgical pain   Pain Radiating Towards neck and upper arm   Pain Onset 1 to 4 weeks ago   Pain Frequency Constant   Aggravating Factors  lifting, sleeping on it   Pain Relieving Factors tylenol   Effect of Pain on Daily Activities unable to sleep, difficulty carrying purse and groceries            [facility] PT Assessment - 08/15/16 0001      Assessment   Medical Diagnosis left breast  cancer   Referring Provider Dr. Sondra Come   Onset Date/Surgical Date 06/06/16   Hand Dominance Right   Prior Therapy none     Precautions   Precautions Other (comment)  at risk for developing lymphedema     Restrictions   Weight Bearing Restrictions No     Balance Screen   Has the patient fallen in the past 6 months No   Has the patient had a decrease in activity level because of a fear of falling?  No   Is the patient reluctant to leave their home because of a fear of falling?  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, vacuum, dust, caregiver for husband   Leisure walks for exercise every day - 30-40 min      Cognition   Overall Cognitive Status Within Functional Limits for tasks assessed     AROM   Right Shoulder Flexion 128 Degrees   Right Shoulder ABduction 140 Degrees   Right Shoulder Internal Rotation 45 Degrees   Right Shoulder External Rotation 88 Degrees   Left Shoulder Flexion 112 Degrees   Left Shoulder ABduction 82 Degrees   Left Shoulder Internal Rotation --  unable secondary to pain   Left Shoulder External Rotation --  unable secondary to pain           LYMPHEDEMA/ONCOLOGY QUESTIONNAIRE - 08/15/16 0828      Type   Cancer Type Left breast cancer     Surgeries   Lumpectomy Date 06/06/16   Sentinel Lymph Node Biopsy Date 06/06/16   Number Lymph Nodes Removed 2     Treatment   Active Chemotherapy Treatment No   Past Chemotherapy Treatment No   Active Radiation Treatment Yes   Date 08/06/16   Body Site left breast   Past Radiation Treatment No   Current Hormone Treatment No   Past Hormone Therapy No     What other symptoms do you have   Are you Having Heaviness or Tightness No   Are you having Pain Yes   Are you having pitting edema Yes   Body Site left breast   Is it Hard or Difficult finding clothes that fit No   Do you have infections Yes   Is there Decreased scar mobility Yes     Lymphedema Assessments   Lymphedema Assessments Upper extremities     Right Upper Extremity Lymphedema   10 cm Proximal to Olecranon Process 25 cm   Olecranon Process 23 cm   10 cm Proximal to Ulnar Styloid Process 18 cm   Just Proximal to Ulnar Styloid Process 14 cm   Across Hand at [employer] 18.5 cm   At [place] of 2nd Digit 5.5 cm     Left Upper Extremity Lymphedema   10 cm Proximal to Olecranon Process 27.5 cm   Olecranon Process 23 cm    10 cm Proximal to Ulnar Styloid Process 18.5 cm   Just Proximal to Ulnar Styloid Process 14 cm   Across Hand at [employer] 18.1 cm   At  Base of 2nd Digit 5.5 cm           [name] - 08/15/16 0001    Open a tight or new jar Moderate difficulty   Do heavy household chores (wash walls, wash floors) Mild difficulty   Carry a shopping bag or briefcase Moderate difficulty   Wash your back Severe difficulty   Use a knife to cut food Mild difficulty   Recreational activities in which you take some force or impact through your arm, shoulder, or hand (golf, hammering, tennis) Moderate difficulty   During the past week, to what extent has your arm, shoulder or hand problem interfered with your normal social activities with family, friends, neighbors, or groups? Quite a bit   During the past week, to what extent has your arm, shoulder or hand problem limited your work or other regular daily activities Modererately   Arm, shoulder, or hand pain. Severe   Tingling (pins and needles) in your arm, shoulder, or hand Severe   Difficulty Sleeping So much difficuSo much difficulty, I can't sleep   DASH Score 59.09 %                     PT Education - 08/15/16 0856    Education provided Yes   Education Details lymphedema risk reduction practices, post op shoulder exercises   Person(s) Educated Patient   Methods Explanation;Handout;Demonstration   Comprehension Verbalized understanding              Breast Clinic Goals - 04/16/16 1207      Patient will be able to verbalize understanding of pertinent lymphedema risk reduction practices relevant to her diagnosis specifically related to skin care.   Time 1   Period Days   Status Achieved     Patient will be able to return demonstrate and/or verbalize understanding of the post-op home exercise program related to regaining shoulder range of motion.   Time 1   Period Days   Status Achieved     Patient will be able to  verbalize understanding of the importance of attending the postoperative After Breast Cancer Class for further lymphedema risk reduction education and therapeutic exercise.   Time 1   Period Days   Status Achieved          Long Term Clinic Goals - 08/15/16 0857      CC Long Term Goal  #1   Title Pt will be able to independently verbalize lymphedema risk reduction practices   Time 4   Period Weeks   Status New     CC Long Term Goal  #2   Title Pt will demonstrate 160 degrees of left shoulder flexion to allow her to reach items on shelves   Baseline 112   Time 4   Period Weeks   Status New     CC Long Term Goal  #3   Title Pt will demonstrate 160 degrees of left shoulder abduction to allow pt to reach items out to sides   Baseline 82   Time 4   Period Weeks   Status New     CC Long Term Goal  #4   Title Pt will demonstrate 160 degrees of right shoulder flexion to allow her to reach items on shelf   Baseline 128   Time 4   Period Weeks   Status New     CC Long Term Goal  #5   Title Pt will be independent in a home exercise  program for continued strengthening and stretching   Time 4   Period Weeks   Status New            Plan - 08/15/16 0847    Clinical Impression Statement Patient was diagnosed 03/28/16 with left grade 2 invasive breast cancer. Patient underwent a left lumpectomy with sentinel lymph node biopsy on 06/06/16. She developed an infection in her left breast and had to have fluid drawn from her breast 2x/wk. This delayed her radiation treatments by three weeks. She still has some edema in her left breast and decreased scar mobility. She began having increased pain in her left shoulder following the infection and decreased left shoulder ROM. Since she has been relying more on her right shoulder she has also noticed a decrease in right shoulder ROM but this side is not as painful as her left. She would benefit from skilled PT services to increase bilateral  shoulder ROM, decrease pain and increase strength. Pt is having difficulty completing ADLs such as carrying groceries and her her purse due to pain and is the main caregiver for her husband who pt reports is terminally ill. 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    Rehab Potential Good   Clinical Impairments Affecting Rehab Potential currently undergoing radiation, hx of left breast infection following surgery   PT Frequency 2x / week   PT Duration 4 weeks   PT Treatment/Interventions ADLs/Self Care Home Management;Therapeutic exercise;Patient/family education;Manual techniques;Manual lymph drainage;Scar mobilization;Passive range of motion;Vasopneumatic Device;Taping   PT Next Visit Plan begin gentle AA/A/PROM to left and right shoulder, give supine dowel shoulder exercises   PT Home Exercise Plan post op shoulder ROM exercises, lymphedema risk reduction handout   Consulted and Agree with Plan of Care Patient      Patient will benefit from skilled therapeutic intervention in order to improve the following deficits and impairments:  Postural dysfunction, Decreased knowledge of precautions, Pain, Impaired UE functional use, Decreased range of motion, Increased edema, Increased fascial restricitons, Decreased strength, Decreased scar mobility  Visit Diagnosis: Acute pain of left shoulder - Plan: PT plan of care cert/re-cert  Stiffness of left shoulder, not elsewhere classified - Plan: PT plan of care cert/re-cert  Acute pain of right shoulder - Plan: PT plan of care cert/re-cert  Stiffness of right shoulder, not elsewhere classified - Plan: PT plan of care cert/re-cert  Localized edema - Plan: PT plan of care cert/re-cert  Muscle weakness (generalized) - Plan: PT plan of care cert/re-cert      G-Codes - 33/35/45 1215     Functional Assessment Tool Used (Outpatient Only) Quick Dash   Functional Limitation Carrying, moving and handling objects   Carrying, Moving and Handling Objects Current Status (G2563) At least 40 percent but less than 60 percent impaired, limited or restricted   Carrying, Moving and Handling Objects Goal Status (S9373) At least 1 percent but less than 20 percent impaired, limited or restricted       Problem List Patient Active Problem List   Diagnosis Date Noted  . Genetic testing 05/01/2016  . Family history of breast cancer   . Family history of pancreatic cancer   . Family history of colon cancer   . Malignant neoplasm of upper-outer quadrant of left breast in female, estrogen receptor negative ([place]) 04/11/2016  . Essential hypertension 09/06/2013  . Sick sinus syndrome ([place])  09/06/2013  . History of permanent cardiac pacemaker placement 09/06/2013  . Hyperlipidemia 09/06/2013    Allyson Sabal [facility] 08/15/2016, 12:17 PM  [place], [place], [address] Phone: [phone]   Fax:  [phone]  Name: [name] MRN: [id number] Date of Birth: 07/17/1945  Manus Gunning, PT 08/15/16 12:18 PM

## 2016-08-18 ENCOUNTER — Ambulatory Visit
Admission: RE | Admit: 2016-08-18 | Discharge: 2016-08-18 | Disposition: A | Discharge status: Home / Self Care | Payer: Medicare HMO | Source: Ambulatory Visit | Attending: Radiation Oncology | Admitting: Radiation Oncology

## 2016-08-18 ENCOUNTER — Ambulatory Visit: Payer: Medicare HMO | Admitting: Physical Therapy

## 2016-08-18 ENCOUNTER — Ambulatory Visit: Payer: Medicare HMO

## 2016-08-18 DIAGNOSIS — Z79899 Other long term (current) drug therapy: Secondary | ICD-10-CM | POA: Diagnosis not present

## 2016-08-18 DIAGNOSIS — Z171 Estrogen receptor negative status [ER-]: Secondary | ICD-10-CM | POA: Diagnosis not present

## 2016-08-18 DIAGNOSIS — C50412 Malignant neoplasm of upper-outer quadrant of left female breast: Secondary | ICD-10-CM | POA: Diagnosis not present

## 2016-08-18 DIAGNOSIS — Z882 Allergy status to sulfonamides status: Secondary | ICD-10-CM | POA: Diagnosis not present

## 2016-08-18 DIAGNOSIS — Z880 Allergy status to penicillin: Secondary | ICD-10-CM | POA: Diagnosis not present

## 2016-08-18 DIAGNOSIS — Z510 Encounter for antineoplastic radiation therapy: Secondary | ICD-10-CM | POA: Diagnosis not present

## 2016-08-18 DIAGNOSIS — Z888 Allergy status to other drugs, medicaments and biological substances status: Secondary | ICD-10-CM | POA: Diagnosis not present

## 2016-08-18 DIAGNOSIS — Z950 Presence of cardiac pacemaker: Secondary | ICD-10-CM | POA: Diagnosis not present

## 2016-08-19 ENCOUNTER — Ambulatory Visit
Admission: RE | Admit: 2016-08-19 | Discharge: 2016-08-19 | Disposition: A | Discharge status: Home / Self Care | Payer: Medicare HMO | Source: Ambulatory Visit | Attending: Radiation Oncology | Admitting: Radiation Oncology

## 2016-08-19 ENCOUNTER — Ambulatory Visit: Payer: Medicare HMO

## 2016-08-19 VITALS — BP 146/86 | HR 66 | Temp 98.3°F | Resp 16 | Wt 158.4 lb

## 2016-08-19 DIAGNOSIS — Z79899 Other long term (current) drug therapy: Secondary | ICD-10-CM | POA: Diagnosis not present

## 2016-08-19 DIAGNOSIS — Z171 Estrogen receptor negative status [ER-]: Principal | ICD-10-CM

## 2016-08-19 DIAGNOSIS — Z882 Allergy status to sulfonamides status: Secondary | ICD-10-CM | POA: Diagnosis not present

## 2016-08-19 DIAGNOSIS — Z880 Allergy status to penicillin: Secondary | ICD-10-CM | POA: Diagnosis not present

## 2016-08-19 DIAGNOSIS — Z510 Encounter for antineoplastic radiation therapy: Secondary | ICD-10-CM | POA: Diagnosis not present

## 2016-08-19 DIAGNOSIS — C50412 Malignant neoplasm of upper-outer quadrant of left female breast: Secondary | ICD-10-CM

## 2016-08-19 DIAGNOSIS — Z888 Allergy status to other drugs, medicaments and biological substances status: Secondary | ICD-10-CM | POA: Diagnosis not present

## 2016-08-19 DIAGNOSIS — Z950 Presence of cardiac pacemaker: Secondary | ICD-10-CM | POA: Diagnosis not present

## 2016-08-19 NOTE — Progress Notes (Addendum)
[name] completed 10th fraction to left breast.  Patient complains of constant arm to shoulder pain on left side.  She states that she started physical therapy this past Friday and states has found some benefit from it.  Patient is taking Norco at night and tylenol during day for relief.  Patient states her fatigue is increasing some.  She states that her appetite is decreasing some as well.  Patient skin to left breast is red with some inflammation.  Patient is using radiaplex twice daily.    Vitals:   08/19/16 1626  BP: (!) 146/86  Pulse: 66  Resp: 16  Temp: 98.3 F (36.8 C)  TempSrc: Oral  SpO2: 97%  Weight: 158 lb 6.4 oz (71.8 kg)    Wt Readings from Last 3 Encounters:  08/19/16 158 lb 6.4 oz (71.8 kg)  08/12/16 159 lb 6.4 oz (72.3 kg)  08/05/16 161 lb 3.2 oz (73.1 kg)

## 2016-08-19 NOTE — Progress Notes (Signed)
  Radiation Oncology         [phone] ________________________________  Name: [name] MRN: [id number]  Date: 08/19/2016  DOB: 1946/03/21  Weekly Radiation Therapy Management    ICD-9-CM ICD-10-CM   1. Malignant neoplasm of upper-outer quadrant of left breast in female, estrogen receptor negative (HCC) 174.4 C50.412    V86.1 Z17.1      Current Dose: 18 Gy     Planned Dose:  50.4 Gy  Narrative . . . . . . . . The patient presents for routine under treatment assessment.                                    [name] has completed 10 fractions to the left breast. She endorses increasing fatigue and decreased appetite. Pt presents with c/o constant arm to shoulder pain on the left. PT offered some relief last Friday. She has scheduled this for twice a week x four weeks. She endorses Norco at night and Tylenol during the day for therapy. Pt reports use of radiaplex BID.                                  Set-up films were reviewed.                                 The chart was checked. Physical Findings. . .  weight is 158 lb 6.4 oz (71.8 kg). Her oral temperature is 98.3 F (36.8 C). Her blood pressure is 146/86 (abnormal) and her pulse is 66. Her respiration is 16 and oxygen saturation is 97%.  [name] Heart has regular rate and rhythm. Erythema along lower portion of left breast. Impression . . . . . . [name] Appropriate to continue treatment. Plan . . . . . . . . . . . . Continue treatment as planned. .  ________________________________   Blair Promise, PhD, MD  This document serves as a record of services personally performed by Gery Pray, MD. It was created on his behalf by Maryla Morrow, a trained medical scribe. The creation of this record is based on the scribe's personal observations and the provider's statements to them. This document has been checked and approved by the attending provider.

## 2016-08-20 ENCOUNTER — Ambulatory Visit: Payer: Medicare HMO

## 2016-08-20 ENCOUNTER — Ambulatory Visit
Admission: RE | Admit: 2016-08-20 | Discharge: 2016-08-20 | Disposition: A | Discharge status: Home / Self Care | Payer: Medicare HMO | Source: Ambulatory Visit | Attending: Radiation Oncology | Admitting: Radiation Oncology

## 2016-08-20 DIAGNOSIS — Z950 Presence of cardiac pacemaker: Secondary | ICD-10-CM | POA: Diagnosis not present

## 2016-08-20 DIAGNOSIS — Z171 Estrogen receptor negative status [ER-]: Secondary | ICD-10-CM | POA: Diagnosis not present

## 2016-08-20 DIAGNOSIS — Z880 Allergy status to penicillin: Secondary | ICD-10-CM | POA: Diagnosis not present

## 2016-08-20 DIAGNOSIS — Z888 Allergy status to other drugs, medicaments and biological substances status: Secondary | ICD-10-CM | POA: Diagnosis not present

## 2016-08-20 DIAGNOSIS — Z510 Encounter for antineoplastic radiation therapy: Secondary | ICD-10-CM | POA: Diagnosis not present

## 2016-08-20 DIAGNOSIS — Z79899 Other long term (current) drug therapy: Secondary | ICD-10-CM | POA: Diagnosis not present

## 2016-08-20 DIAGNOSIS — Z882 Allergy status to sulfonamides status: Secondary | ICD-10-CM | POA: Diagnosis not present

## 2016-08-20 DIAGNOSIS — C50412 Malignant neoplasm of upper-outer quadrant of left female breast: Secondary | ICD-10-CM | POA: Diagnosis not present

## 2016-08-21 ENCOUNTER — Ambulatory Visit: Payer: Medicare HMO

## 2016-08-21 ENCOUNTER — Ambulatory Visit
Admission: RE | Admit: 2016-08-21 | Discharge: 2016-08-21 | Disposition: A | Discharge status: Home / Self Care | Payer: Medicare HMO | Source: Ambulatory Visit | Attending: Radiation Oncology | Admitting: Radiation Oncology

## 2016-08-21 DIAGNOSIS — Z171 Estrogen receptor negative status [ER-]: Secondary | ICD-10-CM | POA: Diagnosis not present

## 2016-08-21 DIAGNOSIS — C50412 Malignant neoplasm of upper-outer quadrant of left female breast: Secondary | ICD-10-CM | POA: Diagnosis not present

## 2016-08-21 DIAGNOSIS — Z950 Presence of cardiac pacemaker: Secondary | ICD-10-CM | POA: Diagnosis not present

## 2016-08-21 DIAGNOSIS — Z79899 Other long term (current) drug therapy: Secondary | ICD-10-CM | POA: Diagnosis not present

## 2016-08-21 DIAGNOSIS — Z880 Allergy status to penicillin: Secondary | ICD-10-CM | POA: Diagnosis not present

## 2016-08-21 DIAGNOSIS — Z882 Allergy status to sulfonamides status: Secondary | ICD-10-CM | POA: Diagnosis not present

## 2016-08-21 DIAGNOSIS — Z510 Encounter for antineoplastic radiation therapy: Secondary | ICD-10-CM | POA: Diagnosis not present

## 2016-08-21 DIAGNOSIS — Z888 Allergy status to other drugs, medicaments and biological substances status: Secondary | ICD-10-CM | POA: Diagnosis not present

## 2016-08-22 ENCOUNTER — Ambulatory Visit: Payer: Medicare HMO | Admitting: Physical Therapy

## 2016-08-22 ENCOUNTER — Ambulatory Visit
Admission: RE | Admit: 2016-08-22 | Discharge: 2016-08-22 | Disposition: A | Discharge status: Home / Self Care | Payer: Medicare HMO | Source: Ambulatory Visit | Attending: Radiation Oncology | Admitting: Radiation Oncology

## 2016-08-22 ENCOUNTER — Ambulatory Visit: Payer: Medicare HMO

## 2016-08-22 DIAGNOSIS — C50412 Malignant neoplasm of upper-outer quadrant of left female breast: Secondary | ICD-10-CM | POA: Diagnosis not present

## 2016-08-22 DIAGNOSIS — Z882 Allergy status to sulfonamides status: Secondary | ICD-10-CM | POA: Diagnosis not present

## 2016-08-22 DIAGNOSIS — Z888 Allergy status to other drugs, medicaments and biological substances status: Secondary | ICD-10-CM | POA: Diagnosis not present

## 2016-08-22 DIAGNOSIS — Z950 Presence of cardiac pacemaker: Secondary | ICD-10-CM | POA: Diagnosis not present

## 2016-08-22 DIAGNOSIS — Z171 Estrogen receptor negative status [ER-]: Secondary | ICD-10-CM | POA: Diagnosis not present

## 2016-08-22 DIAGNOSIS — Z510 Encounter for antineoplastic radiation therapy: Secondary | ICD-10-CM | POA: Diagnosis not present

## 2016-08-22 DIAGNOSIS — C50912 Malignant neoplasm of unspecified site of left female breast: Secondary | ICD-10-CM | POA: Diagnosis not present

## 2016-08-22 DIAGNOSIS — Z880 Allergy status to penicillin: Secondary | ICD-10-CM | POA: Diagnosis not present

## 2016-08-22 DIAGNOSIS — Z79899 Other long term (current) drug therapy: Secondary | ICD-10-CM | POA: Diagnosis not present

## 2016-08-25 ENCOUNTER — Ambulatory Visit: Payer: Medicare HMO

## 2016-08-25 ENCOUNTER — Ambulatory Visit: Payer: Medicare HMO | Admitting: Physical Therapy

## 2016-08-25 ENCOUNTER — Encounter: Payer: Self-pay | Admitting: Physical Therapy

## 2016-08-25 ENCOUNTER — Ambulatory Visit
Admission: RE | Admit: 2016-08-25 | Discharge: 2016-08-25 | Disposition: A | Discharge status: Home / Self Care | Payer: Medicare HMO | Source: Ambulatory Visit | Attending: Radiation Oncology | Admitting: Radiation Oncology

## 2016-08-25 DIAGNOSIS — C50412 Malignant neoplasm of upper-outer quadrant of left female breast: Secondary | ICD-10-CM | POA: Diagnosis not present

## 2016-08-25 DIAGNOSIS — Z882 Allergy status to sulfonamides status: Secondary | ICD-10-CM | POA: Diagnosis not present

## 2016-08-25 DIAGNOSIS — Z79899 Other long term (current) drug therapy: Secondary | ICD-10-CM | POA: Diagnosis not present

## 2016-08-25 DIAGNOSIS — M25512 Pain in left shoulder: Secondary | ICD-10-CM

## 2016-08-25 DIAGNOSIS — M25612 Stiffness of left shoulder, not elsewhere classified: Secondary | ICD-10-CM

## 2016-08-25 DIAGNOSIS — Z888 Allergy status to other drugs, medicaments and biological substances status: Secondary | ICD-10-CM | POA: Diagnosis not present

## 2016-08-25 DIAGNOSIS — M25511 Pain in right shoulder: Secondary | ICD-10-CM

## 2016-08-25 DIAGNOSIS — Z880 Allergy status to penicillin: Secondary | ICD-10-CM | POA: Diagnosis not present

## 2016-08-25 DIAGNOSIS — Z171 Estrogen receptor negative status [ER-]: Secondary | ICD-10-CM | POA: Diagnosis not present

## 2016-08-25 DIAGNOSIS — M25611 Stiffness of right shoulder, not elsewhere classified: Secondary | ICD-10-CM

## 2016-08-25 DIAGNOSIS — Z950 Presence of cardiac pacemaker: Secondary | ICD-10-CM | POA: Diagnosis not present

## 2016-08-25 DIAGNOSIS — Z510 Encounter for antineoplastic radiation therapy: Secondary | ICD-10-CM | POA: Diagnosis not present

## 2016-08-25 NOTE — Therapy (Signed)
[place], [place], [address] Phone: [phone]   Fax:  [phone]  Physical Therapy Treatment  Patient Details  Name: [name] MRN: [id number] Date of Birth: 1945/12/16 Referring Provider: Dr. Sondra Come  Encounter Date: 08/25/2016      PT End of Session - 08/25/16 1349    Visit Number 2   Number of Visits 9   Date for PT Re-Evaluation 09/12/16   PT Start Time 1301   PT Stop Time 1346   PT Time Calculation (min) 45 min   Activity Tolerance Patient tolerated treatment well   Behavior During Therapy [facility] for tasks assessed/performed      Past Medical History:  Diagnosis Date  . Allergic rhinitis   . Arthritis   . Family history of breast cancer   . Family history of colon cancer   . Family history of pancreatic cancer   . GERD (gastroesophageal reflux disease)   . GI bleed   . History of basal cell cancer   . Hyperlipidemia   . Hypertension   . Hypothyroidism   . Lumbar disc disease   . Malignant neoplasm of upper-outer quadrant of left female breast ([place]) 04/11/2016  . Pacemaker   . Peptic ulcer disease   . SSS (sick sinus syndrome) ([place])   . Thyroid disease     Past Surgical History:  Procedure Laterality Date  . ABDOMINAL HYSTERECTOMY    . BREAST LUMPECTOMY WITH RADIOACTIVE SEED AND SENTINEL LYMPH NODE BIOPSY Left 06/06/2016   Procedure: LEFT BREAST LUMPECTOMY WITH RADIOACTIVE SEED AND LEFT AXILLARY SENTINEL LYMPH NODE BIOPSY;  Surgeon: Alphonsa Overall, MD;  Location: [employer];  Service: General;  Laterality: Left;  . BREAST SURGERY     left  . EP IMPLANTABLE DEVICE N/A 05/15/2016   Procedure: PPM Generator Changeout;  Surgeon: Evans Lance, MD;  Location: [place] [facility];  Service: Cardiovascular;  Laterality: N/A;  . HERNIA REPAIR    . PACEMAKER INSERTION      There were no vitals filed for this visit.      Subjective Assessment - 08/25/16 1302    Subjective The left side  is feeling a little bit better. The exercises are going pretty good. I am not sure if I am doing the shoulder exercise right. I am having a little bit more pain I think because of the radiation.    Pertinent History Pacemaker replaced in Dec 2017, left lumpectomy on 06/06/16, currently undergoing radiation, does not need chemotherapy   Patient Stated Goals to be in less pain and to be able to do normal things like lifting my purse and carrying groceries, to get more sleep at night because the pain in my left shoulder keeps me up at night   Currently in Pain? Yes   Pain Score 5    Pain Location Shoulder   Pain Orientation Left   Pain Descriptors / Indicators Throbbing   Pain Type Surgical pain   Pain Onset 1 to 4 weeks ago                         [facility] Adult PT Treatment/Exercise - 08/25/16 0001      Shoulder Exercises: Supine   Flexion AAROM;Both;Other (comment)  3 reps with 5 sec holds using dowel     Shoulder Exercises: Standing   Other Standing Exercises finger ladder into abduction bilaterally x  5 (L at 11, R at 13)  Shoulder Exercises: Pulleys   Flexion 2 minutes  with cues to keep from elevating shoulders   ABduction 2 minutes  with verbal cues to keep shoulders relaxed and not elevate     Shoulder Exercises: Therapy Ball   Flexion 10 reps  holding stretch for 3 seconds   ABduction --     Manual Therapy   Manual Therapy Passive ROM;Myofascial release   Myofascial Release pulling into abduction L shoulder   Passive ROM to bilateral shoulders in direction of flexion, abduction and ER                     Long Term Clinic Goals - 08/15/16 0857      CC Long Term Goal  #1   Title Pt will be able to independently verbalize lymphedema risk reduction practices   Time 4   Period Weeks   Status New     CC Long Term Goal  #2   Title Pt will demonstrate 160 degrees of left shoulder flexion to allow her to reach items on shelves   Baseline 112    Time 4   Period Weeks   Status New     CC Long Term Goal  #3   Title Pt will demonstrate 160 degrees of left shoulder abduction to allow pt to reach items out to sides   Baseline 82   Time 4   Period Weeks   Status New     CC Long Term Goal  #4   Title Pt will demonstrate 160 degrees of right shoulder flexion to allow her to reach items on shelf   Baseline 128   Time 4   Period Weeks   Status New     CC Long Term Goal  #5   Title Pt will be independent in a home exercise program for continued strengthening and stretching   Time 4   Period Weeks   Status New            Plan - 08/25/16 1349    Clinical Impression Statement Began gentle AAROM exercises today to bilateral shoulders. Patient had increased difficulty with abduction compared to flexion. PROM performed at end of session to bilateral shoulders and her PROM improved greatly from beginning of PROM to end of session. Her left side is more tight than her right side.    Rehab Potential Good   Clinical Impairments Affecting Rehab Potential currently undergoing radiation, hx of left breast infection following surgery   PT Frequency 2x / week   PT Duration 4 weeks   PT Treatment/Interventions ADLs/Self Care Home Management;Therapeutic exercise;Patient/family education;Manual techniques;Manual lymph drainage;Scar mobilization;Passive range of motion;Vasopneumatic Device;Taping   PT Next Visit Plan give supine dowel exercises, continue gentle AA/A/PROM to right and left shoulders   PT Home Exercise Plan post op shoulder ROM exercises, lymphedema risk reduction handout   Consulted and Agree with Plan of Care Patient      Patient will benefit from skilled therapeutic intervention in order to improve the following deficits and impairments:  Postural dysfunction, Decreased knowledge of precautions, Pain, Impaired UE functional use, Decreased range of motion, Increased edema, Increased fascial restricitons, Decreased strength,  Decreased scar mobility  Visit Diagnosis: Acute pain of left shoulder  Stiffness of left shoulder, not elsewhere classified  Acute pain of right shoulder  Stiffness of right shoulder, not elsewhere classified     Problem List Patient Active Problem List   Diagnosis Date Noted  . Genetic testing 05/01/2016  .  Family history of breast cancer   . Family history of pancreatic cancer   . Family history of colon cancer   . Malignant neoplasm of upper-outer quadrant of left breast in female, estrogen receptor negative ([place]) 04/11/2016  . Essential hypertension 09/06/2013  . Sick sinus syndrome ([place]) 09/06/2013  . History of permanent cardiac pacemaker placement 09/06/2013  . Hyperlipidemia 09/06/2013    Allyson Sabal [facility] 08/25/2016, 1:55 PM  [place], [place], [address] Phone: [phone]   Fax:  [phone]  Name: [name] MRN: [id number] Date of Birth: 01/08/1946  Manus Gunning, PT 08/25/16 1:55 PM

## 2016-08-26 ENCOUNTER — Ambulatory Visit
Admission: RE | Admit: 2016-08-26 | Discharge: 2016-08-26 | Disposition: A | Discharge status: Home / Self Care | Payer: Medicare HMO | Source: Ambulatory Visit | Attending: Radiation Oncology | Admitting: Radiation Oncology

## 2016-08-26 ENCOUNTER — Encounter: Payer: Self-pay | Admitting: Oncology

## 2016-08-26 ENCOUNTER — Ambulatory Visit: Payer: Medicare HMO

## 2016-08-26 ENCOUNTER — Encounter: Payer: Self-pay | Admitting: Radiation Oncology

## 2016-08-26 VITALS — BP 144/65 | HR 66 | Temp 97.9°F | Ht 63.5 in | Wt 157.4 lb

## 2016-08-26 DIAGNOSIS — C50412 Malignant neoplasm of upper-outer quadrant of left female breast: Secondary | ICD-10-CM | POA: Diagnosis not present

## 2016-08-26 DIAGNOSIS — M25511 Pain in right shoulder: Secondary | ICD-10-CM

## 2016-08-26 DIAGNOSIS — R600 Localized edema: Secondary | ICD-10-CM

## 2016-08-26 DIAGNOSIS — M25512 Pain in left shoulder: Secondary | ICD-10-CM | POA: Diagnosis not present

## 2016-08-26 DIAGNOSIS — Z79899 Other long term (current) drug therapy: Secondary | ICD-10-CM | POA: Diagnosis not present

## 2016-08-26 DIAGNOSIS — Z510 Encounter for antineoplastic radiation therapy: Secondary | ICD-10-CM | POA: Diagnosis not present

## 2016-08-26 DIAGNOSIS — Z950 Presence of cardiac pacemaker: Secondary | ICD-10-CM | POA: Diagnosis not present

## 2016-08-26 DIAGNOSIS — Z171 Estrogen receptor negative status [ER-]: Secondary | ICD-10-CM | POA: Insufficient documentation

## 2016-08-26 DIAGNOSIS — Z923 Personal history of irradiation: Secondary | ICD-10-CM | POA: Insufficient documentation

## 2016-08-26 DIAGNOSIS — M25612 Stiffness of left shoulder, not elsewhere classified: Secondary | ICD-10-CM

## 2016-08-26 DIAGNOSIS — M6281 Muscle weakness (generalized): Secondary | ICD-10-CM

## 2016-08-26 DIAGNOSIS — M25611 Stiffness of right shoulder, not elsewhere classified: Secondary | ICD-10-CM

## 2016-08-26 DIAGNOSIS — Z880 Allergy status to penicillin: Secondary | ICD-10-CM | POA: Diagnosis not present

## 2016-08-26 DIAGNOSIS — Z888 Allergy status to other drugs, medicaments and biological substances status: Secondary | ICD-10-CM | POA: Diagnosis not present

## 2016-08-26 DIAGNOSIS — Z882 Allergy status to sulfonamides status: Secondary | ICD-10-CM | POA: Diagnosis not present

## 2016-08-26 MED ORDER — RADIAPLEXRX EX GEL
Freq: Once | CUTANEOUS | Status: AC
  Administered 2016-08-26: 18:00:00 via TOPICAL

## 2016-08-26 NOTE — Addendum Note (Signed)
Encounter addended by: Ernst Spell, RN on: 08/26/2016  5:44 PM<BR>    Actions taken: MAR administration accepted

## 2016-08-26 NOTE — Progress Notes (Signed)
[name] presents for her 15th fraction of radiation to her Left Breast. She reports pain to her Left shoulder and arm which she rates a 6/10. She is seeing PT for this issue. She does have soreness to her Left Breast. She reports fatigue. Her Left Breast is red and slightly swollen. She is using Radiaplex twice daily as directed. She has received a letter for jury summons next Tuesday and would like a letter to excuse her.   BP (!) 144/65   Pulse 66   Temp 97.9 F (36.6 C)   Ht 5' 3.5" (1.613 m)   Wt 157 lb 6.4 oz (71.4 kg)   SpO2 99% Comment: room air  BMI 27.44 kg/m    Wt Readings from Last 3 Encounters:  08/26/16 157 lb 6.4 oz (71.4 kg)  08/19/16 158 lb 6.4 oz (71.8 kg)  08/12/16 159 lb 6.4 oz (72.3 kg)

## 2016-08-26 NOTE — Progress Notes (Signed)
  Radiation Oncology         [phone] ________________________________  Name: [name] MRN: [id number]  Date: 08/26/2016  DOB: 1945/07/19  Weekly Radiation Therapy Management    ICD-9-CM ICD-10-CM   1. Malignant neoplasm of upper-outer quadrant of left breast in female, estrogen receptor negative (HCC) 174.4 C50.412    V86.1 Z17.1      Current Dose: 27 Gy     Planned Dose:  50.4 Gy  Narrative . . . . . . . . The patient presents for routine under treatment assessment.                                   [name] presents for her 15th fraction of radiation to her Left Breast. She reports pain to her Left shoulder and arm which she rates as a 6/10. She is seeing PT for this issue. She does have soreness to her Left Breast. She reports fatigue. Her Left Breast is red and slightly swollen. She is using Radiaplex twice daily as directed. She has received a letter for jury summons next Tuesday and would like a letter to excuse her.                                  Set-up films were reviewed.                                 The chart was checked. Physical Findings. . .  height is 5' 3.5" (1.613 m) and weight is 157 lb 6.4 oz (71.4 kg). Her temperature is 97.9 F (36.6 C). Her blood pressure is 144/65 (abnormal) and her pulse is 66. Her oxygen saturation is 99%.  . Heart has regular rate and rhythm. Mild erythema and hyperpigmentation changes of the left breast. Impression . . . . . . Marland Kitchen Appropriate to continue treatment. Plan . . . . . . . . . . . . Continue treatment as planned. I will write a letter for the patient to excuse her from jury duty. ________________________________   Blair Promise, PhD, MD  This document serves as a record of services personally performed by Gery Pray, MD. It was created on his behalf by Darcus Austin, a trained medical scribe. The creation of this record is based on the scribe's personal observations and the provider's statements to them. This document  has been checked and approved by the attending provider.

## 2016-08-26 NOTE — Addendum Note (Signed)
Encounter addended by: Ernst Spell, RN on: 08/26/2016  5:22 PM<BR>    Actions taken: Order list changed, Diagnosis association updated

## 2016-08-26 NOTE — Therapy (Signed)
[place], [place], [address] Phone: [phone]   Fax:  [phone]  Physical Therapy Treatment  Patient Details  Name: [name] MRN: [id number] Date of Birth: 29-Apr-1946 Referring Provider: Dr. Sondra Come  Encounter Date: 08/26/2016      PT End of Session - 08/26/16 1019    Visit Number 3   Number of Visits 9   Date for PT Re-Evaluation 09/12/16   PT Start Time 0936   PT Stop Time 1020   PT Time Calculation (min) 44 min   Activity Tolerance Patient tolerated treatment well   Behavior During Therapy [facility] for tasks assessed/performed      Past Medical History:  Diagnosis Date  . Allergic rhinitis   . Arthritis   . Family history of breast cancer   . Family history of colon cancer   . Family history of pancreatic cancer   . GERD (gastroesophageal reflux disease)   . GI bleed   . History of basal cell cancer   . Hyperlipidemia   . Hypertension   . Hypothyroidism   . Lumbar disc disease   . Malignant neoplasm of upper-outer quadrant of left female breast ([place]) 04/11/2016  . Pacemaker   . Peptic ulcer disease   . SSS (sick sinus syndrome) ([place])   . Thyroid disease     Past Surgical History:  Procedure Laterality Date  . ABDOMINAL HYSTERECTOMY    . BREAST LUMPECTOMY WITH RADIOACTIVE SEED AND SENTINEL LYMPH NODE BIOPSY Left 06/06/2016   Procedure: LEFT BREAST LUMPECTOMY WITH RADIOACTIVE SEED AND LEFT AXILLARY SENTINEL LYMPH NODE BIOPSY;  Surgeon: Alphonsa Overall, MD;  Location: [facility];  Service: General;  Laterality: Left;  . BREAST SURGERY     left  . EP IMPLANTABLE DEVICE N/A 05/15/2016   Procedure: PPM Generator Changeout;  Surgeon: Evans Lance, MD;  Location: [facility];  Service: Cardiovascular;  Laterality: N/A;  . HERNIA REPAIR    . PACEMAKER INSERTION      There were no vitals filed for this visit.      Subjective Assessment - 08/26/16 0938    Subjective Not much  change since yesterday, just a little sore today from session yesterday. Lt side is hurting some today, but that's usual.   Pertinent History Pacemaker replaced in Dec 2017, left lumpectomy on 06/06/16, currently undergoing radiation, does not need chemotherapy   Patient Stated Goals to be in less pain and to be able to do normal things like lifting my purse and carrying groceries, to get more sleep at night because the pain in my left shoulder keeps me up at night   Currently in Pain? Yes   Pain Score 6    Pain Location Shoulder   Pain Orientation Left   Pain Descriptors / Indicators Throbbing   Pain Type Surgical pain   Pain Onset 1 to 4 weeks ago   Pain Frequency Constant   Aggravating Factors  lifting, sleeping on it   Pain Relieving Factors tylenol, stretching seemed to help yesterday                         [facility] Adult PT Treatment/Exercise - 08/26/16 0001      Shoulder Exercises: Supine   Horizontal ABduction AAROM;Both;5 reps  5 second holds with dowel   External Rotation AAROM;Both;5 reps;Other (comment)  5 second holds with dowel   Flexion AAROM;Both;5 reps;Other (comment)  5 second holds with  dowel     Shoulder Exercises: Pulleys   Flexion 2 minutes   Flexion Limitations Pt did better with not elevating Lt sholder today   ABduction 2 minutes   ABduction Limitations VC to decrease Lt scapular compensations.      Shoulder Exercises: Therapy Ball   Flexion 10 reps  With forward lean into end of stretch     Shoulder Exercises: ROM/Strengthening   Other ROM/Strengthening Exercises Finger Ladder for Lt UE abduction 5 times (up to 19 with Lt and 21 with Rt)     Manual Therapy   Manual Therapy Myofascial release;Passive ROM   Myofascial Release pulling into abduction Lt shoulder   Passive ROM to left shoulder in direction of flexion, abduction and ER                 PT Education - 08/26/16 1209    Education provided Yes   Education Details  Supine dowel AA/ROM exercises   Person(s) Educated Patient   Methods Explanation;Demonstration;Handout   Comprehension Verbalized understanding;Returned demonstration;Need further instruction            [facility] Goals - 08/15/16 0857      CC Long Term Goal  #1   Title Pt will be able to independently verbalize lymphedema risk reduction practices   Time 4   Period Weeks   Status New     CC Long Term Goal  #2   Title Pt will demonstrate 160 degrees of left shoulder flexion to allow her to reach items on shelves   Baseline 112   Time 4   Period Weeks   Status New     CC Long Term Goal  #3   Title Pt will demonstrate 160 degrees of left shoulder abduction to allow pt to reach items out to sides   Baseline 82   Time 4   Period Weeks   Status New     CC Long Term Goal  #4   Title Pt will demonstrate 160 degrees of right shoulder flexion to allow her to reach items on shelf   Baseline 128   Time 4   Period Weeks   Status New     CC Long Term Goal  #5   Title Pt will be independent in a home exercise program for continued strengthening and stretching   Time 4   Period Weeks   Status New            Plan - 08/26/16 1020    Clinical Impression Statement Continued with AA/ROM progressing HEP to include supine dowel exercises which pt tolerated well. She also did well with P/ROM and reported feeling looser after session.    Rehab Potential Good   Clinical Impairments Affecting Rehab Potential currently undergoing radiation, hx of left breast infection following surgery   PT Frequency 2x / week   PT Duration 4 weeks   PT Treatment/Interventions ADLs/Self Care Home Management;Therapeutic exercise;Patient/family education;Manual techniques;Manual lymph drainage;Scar mobilization;Passive range of motion;Vasopneumatic Device;Taping   PT Next Visit Plan Review supine dowel exercises, continue gentle AA/A/PROM to right and left shoulders   Consulted and Agree with Plan  of Care Patient      Patient will benefit from skilled therapeutic intervention in order to improve the following deficits and impairments:  Postural dysfunction, Decreased knowledge of precautions, Pain, Impaired UE functional use, Decreased range of motion, Increased edema, Increased fascial restricitons, Decreased strength, Decreased scar mobility  Visit Diagnosis: Acute pain of left shoulder  Stiffness of left shoulder, not elsewhere classified  Acute pain of right shoulder  Stiffness of right shoulder, not elsewhere classified  Localized edema  Muscle weakness (generalized)     Problem List Patient Active Problem List   Diagnosis Date Noted  . Genetic testing 05/01/2016  . Family history of breast cancer   . Family history of pancreatic cancer   . Family history of colon cancer   . Malignant neoplasm of upper-outer quadrant of left breast in female, estrogen receptor negative ([facility]) 04/11/2016  . Essential hypertension 09/06/2013  . Sick sinus syndrome ([facility]) 09/06/2013  . History of permanent cardiac pacemaker placement 09/06/2013  . Hyperlipidemia 09/06/2013    Otelia Limes, PTA 08/26/2016, 12:13 PM  [place], [place], [address] Phone: [phone]   Fax:  [phone]  Name: CHANDRA ASHER MRN: [id number] Date of Birth: 1946/03/20

## 2016-08-26 NOTE — Patient Instructions (Signed)

## 2016-08-27 ENCOUNTER — Ambulatory Visit
Admission: RE | Admit: 2016-08-27 | Discharge: 2016-08-27 | Disposition: A | Discharge status: Home / Self Care | Payer: Medicare HMO | Source: Ambulatory Visit | Attending: Radiation Oncology | Admitting: Radiation Oncology

## 2016-08-27 ENCOUNTER — Encounter: Payer: Medicare Other | Admitting: Internal Medicine

## 2016-08-27 ENCOUNTER — Encounter: Payer: Medicare HMO | Admitting: Physical Therapy

## 2016-08-27 ENCOUNTER — Ambulatory Visit: Payer: Medicare HMO

## 2016-08-27 DIAGNOSIS — Z950 Presence of cardiac pacemaker: Secondary | ICD-10-CM | POA: Diagnosis not present

## 2016-08-27 DIAGNOSIS — Z79899 Other long term (current) drug therapy: Secondary | ICD-10-CM | POA: Diagnosis not present

## 2016-08-27 DIAGNOSIS — Z880 Allergy status to penicillin: Secondary | ICD-10-CM | POA: Diagnosis not present

## 2016-08-27 DIAGNOSIS — Z888 Allergy status to other drugs, medicaments and biological substances status: Secondary | ICD-10-CM | POA: Diagnosis not present

## 2016-08-27 DIAGNOSIS — Z171 Estrogen receptor negative status [ER-]: Secondary | ICD-10-CM | POA: Diagnosis not present

## 2016-08-27 DIAGNOSIS — Z510 Encounter for antineoplastic radiation therapy: Secondary | ICD-10-CM | POA: Diagnosis not present

## 2016-08-27 DIAGNOSIS — C50412 Malignant neoplasm of upper-outer quadrant of left female breast: Secondary | ICD-10-CM | POA: Diagnosis not present

## 2016-08-27 DIAGNOSIS — Z882 Allergy status to sulfonamides status: Secondary | ICD-10-CM | POA: Diagnosis not present

## 2016-08-28 ENCOUNTER — Ambulatory Visit
Admission: RE | Admit: 2016-08-28 | Discharge: 2016-08-28 | Disposition: A | Discharge status: Home / Self Care | Payer: Medicare HMO | Source: Ambulatory Visit | Attending: Radiation Oncology | Admitting: Radiation Oncology

## 2016-08-28 ENCOUNTER — Ambulatory Visit: Payer: Medicare HMO

## 2016-08-28 ENCOUNTER — Ambulatory Visit (HOSPITAL_BASED_OUTPATIENT_CLINIC_OR_DEPARTMENT_OTHER): Payer: Medicare HMO | Admitting: Oncology

## 2016-08-28 VITALS — BP 136/75 | HR 72 | Temp 97.8°F | Resp 17 | Ht 63.5 in | Wt 157.3 lb

## 2016-08-28 DIAGNOSIS — Z888 Allergy status to other drugs, medicaments and biological substances status: Secondary | ICD-10-CM | POA: Diagnosis not present

## 2016-08-28 DIAGNOSIS — Z171 Estrogen receptor negative status [ER-]: Secondary | ICD-10-CM

## 2016-08-28 DIAGNOSIS — Z950 Presence of cardiac pacemaker: Secondary | ICD-10-CM | POA: Diagnosis not present

## 2016-08-28 DIAGNOSIS — Z510 Encounter for antineoplastic radiation therapy: Secondary | ICD-10-CM | POA: Diagnosis not present

## 2016-08-28 DIAGNOSIS — Z882 Allergy status to sulfonamides status: Secondary | ICD-10-CM | POA: Diagnosis not present

## 2016-08-28 DIAGNOSIS — Z79899 Other long term (current) drug therapy: Secondary | ICD-10-CM | POA: Diagnosis not present

## 2016-08-28 DIAGNOSIS — Z880 Allergy status to penicillin: Secondary | ICD-10-CM | POA: Diagnosis not present

## 2016-08-28 DIAGNOSIS — C50412 Malignant neoplasm of upper-outer quadrant of left female breast: Secondary | ICD-10-CM | POA: Diagnosis not present

## 2016-08-28 NOTE — Progress Notes (Signed)
[facility]  [phone] [phone]     ID: [name] DOB: May 13, 1946  MR#: [id number]  XTG#:[id number]  Patient Care Team: [name], MD as PCP - General (Family Medicine) Chauncey Cruel, MD as Consulting Physician (Oncology) Gery Pray, MD as Consulting Physician (Radiation Oncology) Alphonsa Overall, MD as Consulting Physician (General Surgery) Teena Irani, MD as Consulting Physician (Gastroenterology) Chauncey Cruel, MD OTHER MD:  CHIEF COMPLAINT: HER-2 positive breast cancer  CURRENT TREATMENT: adjuvant radiation   BREAST CANCER HISTORY: From the original intake note:   [name] had bilateral screening mammography at [facility] 03/28/2016. This found the breast density to be category A. In the left breast at the 12:00 position there was a new oval lesion. There were no other findings of concern. On 04/04/2016 the patient underwent left ultrasonography this showed a possible hypoechoic lesion in the upper outer quadrant of the left breast, measuring approximately 0.5 cm. The left axilla was mammographically benign.  Biopsy of the left breast area in question 04/09/2016 showed ([id number]) invasive ductal carcinoma, grade 1 or 2, estrogen and progesterone receptor negative, with an MIB-1 of 10%, but HER-2 amplified, the signals ratio being 7.36 and the number per cell 9.20.  Her subsequent history is as detailed below.  INTERVAL HISTORY: [name] returns today for follow-up of her estrogen receptor positive breast cancer. She is now undergoing radiation which will continue another 2 weeks or so. She is having significant problems with redness and some discomfort. There has been no blistering she says. She is also more fatigued than before. Of course she is the primary caregiver for her husband, who under hospice care at present and this in itself is exhausting.  REVIEW OF SYSTEMS: She is undergoing physical therapy for range of motion of the  surgical site arm and she is benefiting from this greatly. Aside from that a detailed review of systems today was stable   PAST MEDICAL HISTORY: Past Medical History:  Diagnosis Date  . Allergic rhinitis   . Arthritis   . Family history of breast cancer   . Family history of colon cancer   . Family history of pancreatic cancer   . GERD (gastroesophageal reflux disease)   . GI bleed   . History of basal cell cancer   . Hyperlipidemia   . Hypertension   . Hypothyroidism   . Lumbar disc disease   . Malignant neoplasm of upper-outer quadrant of left female breast ([place]) 04/11/2016  . Pacemaker   . Peptic ulcer disease   . SSS (sick sinus syndrome) ([place])   . Thyroid disease     PAST SURGICAL HISTORY: Past Surgical History:  Procedure Laterality Date  . ABDOMINAL HYSTERECTOMY    . BREAST LUMPECTOMY WITH RADIOACTIVE SEED AND SENTINEL LYMPH NODE BIOPSY Left 06/06/2016   Procedure: LEFT BREAST LUMPECTOMY WITH RADIOACTIVE SEED AND LEFT AXILLARY SENTINEL LYMPH NODE BIOPSY;  Surgeon: Alphonsa Overall, MD;  Location: [facility];  Service: General;  Laterality: Left;  . BREAST SURGERY     left  . EP IMPLANTABLE DEVICE N/A 05/15/2016   Procedure: PPM Generator Changeout;  Surgeon: Evans Lance, MD;  Location: [facility];  Service: Cardiovascular;  Laterality: N/A;  . HERNIA REPAIR    . PACEMAKER INSERTION      FAMILY HISTORY Family History  Problem Relation Age of Onset  . Congestive Heart Failure Mother   . Hypertension Father   . Bone cancer Father   . Breast cancer Sister  53  . Bradycardia Cousin   . Breast cancer Cousin 52  . Breast cancer Cousin 28  . Colon cancer Cousin 60  . Pancreatic cancer Paternal Aunt 76  . Rectal cancer Paternal Aunt 77  . Heart attack Maternal Grandmother   . Heart attack Paternal Grandmother   . Stroke Paternal Grandfather   . Melanoma Daughter     dx in her 44s  The patient's father died at the age of 22 with myeloma. The patient's mother died  at the age of 31 from heart disease the patient has a sister diagnosed with breast cancer at age 71, a paternal cousin diagnosed with breast cancer at age 68, and a maternal cousin diagnosed with breast cancer. There is also a history of pancreatic, rectal, and: colon cancers all on the father's side of the family  GYNECOLOGIC HISTORY:  No LMP recorded. Patient has had a hysterectomy. Menarche age 50, first live birth age 45, the patient is [name]. She stopped having periods in 1981. She did not take hormone replacement. She used oral contraceptives remotely with no complications  SOCIAL HISTORY:  She has worked as a Pharmacist, hospital substituted, in Scientist, research (medical), and more recently as a Engineer, building services. Her husband [name] is under the care of hospice because of cirrhosis. The patient's daughter [name] lives in [place] where she works as a Pharmacist, hospital. Her daughter [name] lives in [place]. 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     ADVANCED DIRECTIVES: The patient's daughter, [name], is her healthcare power of attorney. She can be reached at [phone]   HEALTH MAINTENANCE: Social History  Substance Use Topics  . Smoking status: Never Smoker  . Smokeless tobacco: Never Used  . Alcohol use No     Colonoscopy: 2007/ [facility]  PAP:  Bone density: 03/28/2016 at [facility], T score of -1.3   Allergies  Allergen Reactions  . Nsaids Other (See Comments)    Upper GI bleed  . Penicillins Itching    Has patient had a PCN reaction causing immediate rash, facial/tongue/throat swelling, SOB or lightheadedness with hypotension:No Has patient had a PCN reaction causing severe rash involving mucus membranes or skin necrosis:No Has patient had a PCN reaction that required hospitalization:No Has patient had a PCN reaction occurring within the last 10 years:No If all of the above answers are "NO", then may proceed with Cephalosporin use.   . Bee Venom   . Sulfa Antibiotics     itching    . Wasp Venom     Current Outpatient Prescriptions  Medication Sig Dispense Refill  . acetaminophen (TYLENOL) 500 MG tablet Take 1,000 mg by mouth every 6 (six) hours as needed.    Marland Kitchen albuterol (PROVENTIL HFA;VENTOLIN HFA) 108 (90 BASE) MCG/ACT inhaler Inhale into the lungs every 6 (six) hours as needed for wheezing or shortness of breath.    Marland Kitchen albuterol (PROVENTIL) (2.5 MG/3ML) 0.083% nebulizer solution Take 2.5 mg by nebulization every 6 (six) hours as needed for wheezing or shortness of breath.    Marland Kitchen atorvastatin (LIPITOR) 80 MG tablet Take 80 mg by mouth every evening.     Marland Kitchen azelastine (ASTELIN) 137 MCG/SPRAY nasal spray Place 2 sprays into both nostrils 2 (two) times daily as needed for allergies. Use in each nostril as directed    . DIHYDROERGOTAMINE MESYLATE NA Inject 2 mLs into the muscle daily as needed (for migraine headaches).     . doxycycline (VIBRA-TABS) 100 MG tablet Take 1 tablet (100 mg  total) by mouth 2 (two) times daily. 20 tablet 0  . fenofibrate 160 MG tablet Take 160 mg by mouth every evening.     . Fish Oil OIL Take 1,000 mg by mouth daily.     . hyaluronate sodium (RADIAPLEXRX) GEL Apply 1 application topically 2 (two) times daily.    Marland Kitchen HYDROcodone-acetaminophen (NORCO/VICODIN) 5-325 MG tablet Take 1-2 tablets by mouth every 6 (six) hours as needed. 20 tablet 0  . levothyroxine (SYNTHROID, LEVOTHROID) 100 MCG tablet Take 100 mcg by mouth daily before breakfast.    . mometasone (NASONEX) 50 MCG/ACT nasal spray Place 2 sprays into the nose daily as needed (for nasal congestion.).     Marland Kitchen non-metallic deodorant (ALRA) MISC Apply 1 application topically daily as needed.    Marland Kitchen omeprazole (PRILOSEC) 20 MG capsule Take 20 mg by mouth daily.    . promethazine (PHENERGAN) 25 MG tablet Take 25 mg by mouth every 6 (six) hours as needed for nausea or vomiting.    . triamterene-hydrochlorothiazide (MAXZIDE) 75-50 MG tablet Take 1 tablet by mouth every evening.     . Vitamin D,  Cholecalciferol, 1000 UNITS TABS Take 1,000 Units by mouth daily.      No current facility-administered medications for this visit.     OBJECTIVE: Middle-aged white woman In no acute distress  Vitals:   08/28/16 1512  BP: 136/75  Pulse: 72  Resp: 17  Temp: 97.8 F (36.6 C)     Body mass index is 27.43 kg/m.    ECOG FS:1 - Symptomatic but completely ambulatory  Sclerae unicteric, EOMs intact Oropharynx clear and moist No cervical or supraclavicular adenopathy Lungs no rales or rhonchi Heart regular rate and rhythm Abd soft, nontender, positive bowel sounds MSK no focal spinal tenderness, no upper extremity lymphedema Neuro: nonfocal, well oriented, appropriate affect Breasts: The right breast is unremarkable. The left breast is status post lumpectomy and is currently receiving radiation. There is erythema but no desquamation. The cosmetic result is excellent. Left axilla is benign.  LAB RESULTS:  CMP     Component Value Date/Time   NA 138 05/30/2016 0840   NA 142 04/16/2016 0822   K 3.9 05/30/2016 0840   K 3.8 04/16/2016 0822   CL 106 05/30/2016 0840   CO2 25 05/30/2016 0840   CO2 23 04/16/2016 0822   GLUCOSE 84 05/30/2016 0840   GLUCOSE 102 04/16/2016 0822   BUN 21 (H) 05/30/2016 0840   BUN 25.3 04/16/2016 0822   CREATININE 0.75 05/30/2016 0840   CREATININE 0.97 (H) 05/01/2016 1627   CREATININE 0.8 04/16/2016 0822   CALCIUM 9.5 05/30/2016 0840   CALCIUM 9.7 04/16/2016 0822   PROT 6.9 04/16/2016 0822   ALBUMIN 3.8 04/16/2016 0822   AST 20 04/16/2016 0822   ALT 18 04/16/2016 0822   ALKPHOS 68 04/16/2016 0822   BILITOT 0.60 04/16/2016 0822   GFRNONAA >60 05/30/2016 0840   GFRAA >60 05/30/2016 0840    INo results found for: SPEP, UPEP  Lab Results  Component Value Date   WBC 5.2 05/30/2016   NEUTROABS 3,024 05/01/2016   HGB 13.6 05/30/2016   HCT 40.1 05/30/2016   MCV 86.1 05/30/2016   PLT 334 05/30/2016      Chemistry      Component Value Date/Time     NA 138 05/30/2016 0840   NA 142 04/16/2016 0822   K 3.9 05/30/2016 0840   K 3.8 04/16/2016 0822   CL 106 05/30/2016 0840   CO2  25 05/30/2016 0840   CO2 23 04/16/2016 0822   BUN 21 (H) 05/30/2016 0840   BUN 25.3 04/16/2016 0822   CREATININE 0.75 05/30/2016 0840   CREATININE 0.97 (H) 05/01/2016 1627   CREATININE 0.8 04/16/2016 0822      Component Value Date/Time   CALCIUM 9.5 05/30/2016 0840   CALCIUM 9.7 04/16/2016 0822   ALKPHOS 68 04/16/2016 0822   AST 20 04/16/2016 0822   ALT 18 04/16/2016 0822   BILITOT 0.60 04/16/2016 0822       No results found for: LABCA2  No components found for: LABCA125  No results for input(s): INR in the last 168 hours.  Urinalysis No results found for: COLORURINE, APPEARANCEUR, LABSPEC, PHURINE, GLUCOSEU, HGBUR, BILIRUBINUR, KETONESUR, PROTEINUR, UROBILINOGEN, NITRITE, LEUKOCYTESUR   STUDIES: No results found.  ELIGIBLE FOR AVAILABLE RESEARCH PROTOCOL: no  ASSESSMENT: 71 y.o. [name] woman status post left breast upper outer quadrant biopsy 04/09/2016 for a clinical T1a N0, stage IA invasive ductal carcinoma, grade 1 or 2, estrogen and progesterone receptor negative, with an MIB-1 of 10%, but HER-2 amplified  (1) left lumpectomy and sentinel lymph node samplng 06/06/2016 showed a pT1a pN0, stage IA  Invasive ductal carcinoma , grade 2 , with negatve margins  (2)   No consideration of anti-HER-2 immunotherapy/chemotherapy given final tumor size  (3) adjuvant radiation ongoing  (a) the patient's pacemaker has been moved to the contralateral side facilitate treatment   (4)  Genetics testing through the Comprehensive Cancer Panel offered by [facility]  found no deleterious mutations in APC, ATM, AXIN2, BARD1, BMPR1A, BRCA1, BRCA2, BRIP1, CDH1, CDK4, CDKN2A, CHEK2, EPCAM, FANCC, MLH1, MSH2, MSH6, MUTYH, NBN, PALB2, PMS2, POLD1, POLE, PTEN, RAD51C, RAD51D, SCG5/GREM1, SMAD4, STK11, TP53, VHL, and XRCC2.     (5) consider anti-estrogens at the  completion of local treatment.  PLAN: We spent approximately 30 minutes today going over her situation in detail. She understands she has a very good prognosis. The use of anti-estrogens in her case is more optional and preventative then mandatory. Nevertheless it really needs to be considered and to help [place] without I'm going to get a report of the bone density she had at [facility] about 3 months ago. She does not know the results.  She is going to feel even more tired than she does now because of the radiation but also because her husband situation is coming to a crisis and she thinks he may not last and next 2 months. They do have hospice involved which is a blessing.  She knows to call for any problems that may develop before her return visit here, which will be in June. At that time we will decide first of all whether she is interested in trying and anti-estrogen and then, if so, which of the multiple agents available she will choose  She knows to call for any problems that may develop before her next visit.  Chauncey Cruel, MD   08/28/2016 3:29 PM Medical Oncology and Hematology [facility] [address] [place], [place] [address] Tel. [phone]    Fax. [phone]

## 2016-08-29 ENCOUNTER — Ambulatory Visit: Payer: Medicare HMO

## 2016-08-29 ENCOUNTER — Ambulatory Visit
Admission: RE | Admit: 2016-08-29 | Discharge: 2016-08-29 | Disposition: A | Discharge status: Home / Self Care | Payer: Medicare HMO | Source: Ambulatory Visit | Attending: Radiation Oncology | Admitting: Radiation Oncology

## 2016-08-29 DIAGNOSIS — Z880 Allergy status to penicillin: Secondary | ICD-10-CM | POA: Diagnosis not present

## 2016-08-29 DIAGNOSIS — Z888 Allergy status to other drugs, medicaments and biological substances status: Secondary | ICD-10-CM | POA: Diagnosis not present

## 2016-08-29 DIAGNOSIS — Z510 Encounter for antineoplastic radiation therapy: Secondary | ICD-10-CM | POA: Diagnosis not present

## 2016-08-29 DIAGNOSIS — Z950 Presence of cardiac pacemaker: Secondary | ICD-10-CM | POA: Diagnosis not present

## 2016-08-29 DIAGNOSIS — C50412 Malignant neoplasm of upper-outer quadrant of left female breast: Secondary | ICD-10-CM | POA: Diagnosis not present

## 2016-08-29 DIAGNOSIS — Z171 Estrogen receptor negative status [ER-]: Secondary | ICD-10-CM | POA: Diagnosis not present

## 2016-08-29 DIAGNOSIS — Z882 Allergy status to sulfonamides status: Secondary | ICD-10-CM | POA: Diagnosis not present

## 2016-08-29 DIAGNOSIS — Z79899 Other long term (current) drug therapy: Secondary | ICD-10-CM | POA: Diagnosis not present

## 2016-09-01 ENCOUNTER — Ambulatory Visit
Admission: RE | Admit: 2016-09-01 | Discharge: 2016-09-01 | Disposition: A | Discharge status: Home / Self Care | Payer: Medicare HMO | Source: Ambulatory Visit | Attending: Radiation Oncology | Admitting: Radiation Oncology

## 2016-09-01 ENCOUNTER — Ambulatory Visit: Payer: Medicare HMO

## 2016-09-01 ENCOUNTER — Ambulatory Visit: Payer: Medicare HMO | Attending: Radiation Oncology | Admitting: Physical Therapy

## 2016-09-01 DIAGNOSIS — M25511 Pain in right shoulder: Secondary | ICD-10-CM

## 2016-09-01 DIAGNOSIS — M25611 Stiffness of right shoulder, not elsewhere classified: Secondary | ICD-10-CM | POA: Insufficient documentation

## 2016-09-01 DIAGNOSIS — Z880 Allergy status to penicillin: Secondary | ICD-10-CM | POA: Diagnosis not present

## 2016-09-01 DIAGNOSIS — Z79899 Other long term (current) drug therapy: Secondary | ICD-10-CM | POA: Diagnosis not present

## 2016-09-01 DIAGNOSIS — M25512 Pain in left shoulder: Secondary | ICD-10-CM

## 2016-09-01 DIAGNOSIS — R600 Localized edema: Secondary | ICD-10-CM | POA: Diagnosis not present

## 2016-09-01 DIAGNOSIS — Z171 Estrogen receptor negative status [ER-]: Secondary | ICD-10-CM | POA: Diagnosis not present

## 2016-09-01 DIAGNOSIS — M25612 Stiffness of left shoulder, not elsewhere classified: Secondary | ICD-10-CM

## 2016-09-01 DIAGNOSIS — M6281 Muscle weakness (generalized): Secondary | ICD-10-CM

## 2016-09-01 DIAGNOSIS — Z888 Allergy status to other drugs, medicaments and biological substances status: Secondary | ICD-10-CM | POA: Diagnosis not present

## 2016-09-01 DIAGNOSIS — Z950 Presence of cardiac pacemaker: Secondary | ICD-10-CM | POA: Diagnosis not present

## 2016-09-01 DIAGNOSIS — C50412 Malignant neoplasm of upper-outer quadrant of left female breast: Secondary | ICD-10-CM | POA: Diagnosis not present

## 2016-09-01 DIAGNOSIS — Z510 Encounter for antineoplastic radiation therapy: Secondary | ICD-10-CM | POA: Diagnosis not present

## 2016-09-01 DIAGNOSIS — Z882 Allergy status to sulfonamides status: Secondary | ICD-10-CM | POA: Diagnosis not present

## 2016-09-01 NOTE — Therapy (Signed)
[place], [place], [address] Phone: [phone]   Fax:  [phone]  Physical Therapy Treatment  Patient Details  Name: [name] MRN: [id number] Date of Birth: Jan 14, 1946 Referring Provider: Dr. Sondra Come  Encounter Date: 09/01/2016      PT End of Session - 09/01/16 0908    Visit Number 4   Number of Visits 9   Date for PT Re-Evaluation 09/12/16   PT Start Time 0805   PT Stop Time 8676   PT Time Calculation (min) 42 min   Activity Tolerance Patient tolerated treatment well   Behavior During Therapy [facility] for tasks assessed/performed      Past Medical History:  Diagnosis Date  . Allergic rhinitis   . Arthritis   . Family history of breast cancer   . Family history of colon cancer   . Family history of pancreatic cancer   . GERD (gastroesophageal reflux disease)   . GI bleed   . History of basal cell cancer   . Hyperlipidemia   . Hypertension   . Hypothyroidism   . Lumbar disc disease   . Malignant neoplasm of upper-outer quadrant of left female breast ([place]) 04/11/2016  . Pacemaker   . Peptic ulcer disease   . SSS (sick sinus syndrome) ([place])   . Thyroid disease     Past Surgical History:  Procedure Laterality Date  . ABDOMINAL HYSTERECTOMY    . BREAST LUMPECTOMY WITH RADIOACTIVE SEED AND SENTINEL LYMPH NODE BIOPSY Left 06/06/2016   Procedure: LEFT BREAST LUMPECTOMY WITH RADIOACTIVE SEED AND LEFT AXILLARY SENTINEL LYMPH NODE BIOPSY;  Surgeon: Alphonsa Overall, MD;  Location: [place];  Service: General;  Laterality: Left;  . BREAST SURGERY     left  . EP IMPLANTABLE DEVICE N/A 05/15/2016   Procedure: PPM Generator Changeout;  Surgeon: Evans Lance, MD;  Location: [place] [facility];  Service: Cardiovascular;  Laterality: N/A;  . HERNIA REPAIR    . PACEMAKER INSERTION      There were no vitals filed for this visit.      Subjective Assessment - 09/01/16 0807    Subjective Did the  exercises that she learned.  They went well.  "When I lie down at night, if I lie on either side, my shoulders and my neck throb.  And in the morning it's like a toothache." She does feel stronger from the exercises, but that hasn't changed the sleeping issue.    Currently in Pain? Yes   Pain Score 6    Pain Location Neck  and upper traps down across shoulders   Pain Orientation Right;Left;Posterior   Pain Descriptors / Indicators Throbbing   Aggravating Factors  lying on either side   Pain Relieving Factors sitting up                         [facility] Adult PT Treatment/Exercise - 09/01/16 0001      Self-Care   Self-Care Other Self-Care Comments   Other Self-Care Comments  Discussed with patient her report of throbbing pain in neck and going down past both shoulders when she sleeps on either side.  Educated about using pillows to get her body in neutral position, and possibly trying to change position frequently during the night.     Shoulder Exercises: Supine   Horizontal ABduction Strengthening;Both;5 reps;Theraband   Theraband Level (Shoulder Horizontal ABduction) Level 1 (Yellow)   External Rotation Strengthening;Both;5 reps;Theraband  Theraband Level (Shoulder External Rotation) Level 1 (Yellow)   Flexion Strengthening;Both;5 reps  narrow and wide grip   Theraband Level (Shoulder Flexion) Level 1 (Yellow)   Other Supine Exercises active D2 with each arm vs. yellow Theraband x 5 each     Manual Therapy   Manual Therapy Other (comment)   Other Manual Therapy soft tissue work in prone to neck and upper back for pain relief  signif. tightness bilat. upper traps                PT Education - 09/01/16 0847    Education provided Yes   Education Details supine scapular series with yellow Theraband   Person(s) Educated Patient   Methods Explanation;Tactile cues;Verbal cues;Handout   Comprehension Verbalized understanding;Returned demonstration               Breast Clinic Goals - 04/16/16 1207      Patient will be able to verbalize understanding of pertinent lymphedema risk reduction practices relevant to her diagnosis specifically related to skin care.   Time 1   Period Days   Status Achieved     Patient will be able to return demonstrate and/or verbalize understanding of the post-op home exercise program related to regaining shoulder range of motion.   Time 1   Period Days   Status Achieved     Patient will be able to verbalize understanding of the importance of attending the postoperative After Breast Cancer Class for further lymphedema risk reduction education and therapeutic exercise.   Time 1   Period Days   Status Achieved          Long Term Clinic Goals - 08/15/16 0857      CC Long Term Goal  #1   Title Pt will be able to independently verbalize lymphedema risk reduction practices   Time 4   Period Weeks   Status New     CC Long Term Goal  #2   Title Pt will demonstrate 160 degrees of left shoulder flexion to allow her to reach items on shelves   Baseline 112   Time 4   Period Weeks   Status New     CC Long Term Goal  #3   Title Pt will demonstrate 160 degrees of left shoulder abduction to allow pt to reach items out to sides   Baseline 82   Time 4   Period Weeks   Status New     CC Long Term Goal  #4   Title Pt will demonstrate 160 degrees of right shoulder flexion to allow her to reach items on shelf   Baseline 128   Time 4   Period Weeks   Status New     CC Long Term Goal  #5   Title Pt will be independent in a home exercise program for continued strengthening and stretching   Time 4   Period Weeks   Status New            Plan - 09/01/16 0909    Clinical Impression Statement Patient came in reporting discomfort in neck and both shoulders that she gets when sleeping on either side.  We discussed neutral positoning of spine in bed.  Her upper traps are both very tight, so soft  tissue work was done today to try to give her some relief.  She did report feeling better at end of session.  She was also instructed in supine scapular strengthening to work on posture improvement.  She denied needing help with recent dowel exercise HEP.   Rehab Potential Good   Clinical Impairments Affecting Rehab Potential currently undergoing radiation, hx of left breast infection following surgery   PT Frequency 2x / week   PT Duration 4 weeks   PT Treatment/Interventions ADLs/Self Care Home Management;Therapeutic exercise;Patient/family education;Manual techniques;Manual lymph drainage;Scar mobilization;Passive range of motion;Vasopneumatic Device;Taping   PT Next Visit Plan Review supine dowel exercises, continue gentle AA/A/PROM to right and left shoulders   PT Home Exercise Plan post op shoulder ROM exercises, lymphedema risk reduction handout   Consulted and Agree with Plan of Care Patient      Patient will benefit from skilled therapeutic intervention in order to improve the following deficits and impairments:  Postural dysfunction, Decreased knowledge of precautions, Pain, Impaired UE functional use, Decreased range of motion, Increased edema, Increased fascial restricitons, Decreased strength, Decreased scar mobility  Visit Diagnosis: Acute pain of left shoulder  Stiffness of left shoulder, not elsewhere classified  Acute pain of right shoulder  Stiffness of right shoulder, not elsewhere classified  Muscle weakness (generalized)     Problem List Patient Active Problem List   Diagnosis Date Noted  . Genetic testing 05/01/2016  . Family history of breast cancer   . Family history of pancreatic cancer   . Family history of colon cancer   . Malignant neoplasm of upper-outer quadrant of left breast in female, estrogen receptor negative ([place]) 04/11/2016  . Essential hypertension 09/06/2013  . Sick sinus syndrome ([place]) 09/06/2013  . History of permanent cardiac pacemaker  placement 09/06/2013  . Hyperlipidemia 09/06/2013    SALISBURY,DONNA 09/01/2016, 9:13 AM  [place], [place], [address] Phone: [phone]   Fax:  [phone]  Name: [name] MRN: [id number] Date of Birth: 1945-12-11  Serafina Royals, PT 09/01/16 9:13 AM

## 2016-09-01 NOTE — Patient Instructions (Signed)

## 2016-09-02 ENCOUNTER — Ambulatory Visit: Payer: Medicare HMO

## 2016-09-02 ENCOUNTER — Ambulatory Visit
Admission: RE | Admit: 2016-09-02 | Discharge: 2016-09-02 | Disposition: A | Discharge status: Home / Self Care | Payer: Medicare HMO | Source: Ambulatory Visit | Attending: Radiation Oncology | Admitting: Radiation Oncology

## 2016-09-02 VITALS — BP 112/69 | HR 69 | Resp 16 | Wt 156.6 lb

## 2016-09-02 DIAGNOSIS — C50412 Malignant neoplasm of upper-outer quadrant of left female breast: Secondary | ICD-10-CM

## 2016-09-02 DIAGNOSIS — Z882 Allergy status to sulfonamides status: Secondary | ICD-10-CM | POA: Diagnosis not present

## 2016-09-02 DIAGNOSIS — Z171 Estrogen receptor negative status [ER-]: Secondary | ICD-10-CM | POA: Diagnosis not present

## 2016-09-02 DIAGNOSIS — Z950 Presence of cardiac pacemaker: Secondary | ICD-10-CM | POA: Diagnosis not present

## 2016-09-02 DIAGNOSIS — Z888 Allergy status to other drugs, medicaments and biological substances status: Secondary | ICD-10-CM | POA: Diagnosis not present

## 2016-09-02 DIAGNOSIS — Z880 Allergy status to penicillin: Secondary | ICD-10-CM | POA: Diagnosis not present

## 2016-09-02 DIAGNOSIS — Z510 Encounter for antineoplastic radiation therapy: Secondary | ICD-10-CM | POA: Diagnosis not present

## 2016-09-02 DIAGNOSIS — Z79899 Other long term (current) drug therapy: Secondary | ICD-10-CM | POA: Diagnosis not present

## 2016-09-02 MED ORDER — BIAFINE EX EMUL
Freq: Once | CUTANEOUS | Status: AC
  Administered 2016-09-02: 17:00:00 via TOPICAL

## 2016-09-02 NOTE — Progress Notes (Signed)
Weight and vitals stable. Denies pain. Hyperpigmentation without desquamation of left/treated breast noted. Reports skin of left breast is increasingly itchy. Reports using radiaplex bid as directed. No evidence of lymphedema noted in left arm. Reports increased fatigue.   BP 112/69 (BP Location: Right Arm, Patient Position: Sitting, Cuff Size: Normal)   Pulse 69   Resp 16   Wt 156 lb 9.6 oz (71 kg)   SpO2 100%   BMI 27.31 kg/m  Wt Readings from Last 3 Encounters:  09/02/16 156 lb 9.6 oz (71 kg)  08/28/16 157 lb 4.8 oz (71.4 kg)  08/26/16 157 lb 6.4 oz (71.4 kg)

## 2016-09-02 NOTE — Progress Notes (Signed)
  Radiation Oncology         [phone] ________________________________  Name: [name] MRN: [id number]  Date: 09/02/2016  DOB: 04/08/1946  Weekly Radiation Therapy Management    ICD-9-CM ICD-10-CM   1. Malignant neoplasm of upper-outer quadrant of left breast in female, estrogen receptor negative (HCC) 174.4 C50.412 topical emolient (BIAFINE) emulsion   V86.1 Z17.1      Current Dose: 36 Gy     Planned Dose:  50.4 Gy  Narrative . . . . . . . . The patient presents for routine under treatment assessment.                                   Weight and vitals stable. Denies pain. Hyperpigmentation without desquamation of left/treated breast noted. Reports skin of left breast is increasingly itchy. Reports using radiaplex BID as directed. No evidence of lymphedema noted in left arm. Reports increased fatigue.                                  Set-up films were reviewed.                                 The chart was checked. Physical Findings. . .  weight is 156 lb 9.6 oz (71 kg). Her blood pressure is 112/69 and her pulse is 69. Her respiration is 16 and oxygen saturation is 100%.  Heart has IRREGULAR rate (the patient does have Afib). Erythema and dermatitis in the upper inner aspect of the left breast. Impression . . . . . . Marland Kitchen Appropriate to continue treatment. Plan . . . . . . . . . . . . Continue treatment as planned. We will switch the patient to Biafine due to having itchiness. ________________________________   Blair Promise, PhD, MD  This document serves as a record of services personally performed by Gery Pray, MD. It was created on his behalf by Darcus Austin, a trained medical scribe. The creation of this record is based on the scribe's personal observations and the provider's statements to them. This document has been checked and approved by the attending provider.

## 2016-09-03 ENCOUNTER — Ambulatory Visit: Payer: Medicare HMO

## 2016-09-03 ENCOUNTER — Ambulatory Visit
Admission: RE | Admit: 2016-09-03 | Discharge: 2016-09-03 | Disposition: A | Discharge status: Home / Self Care | Payer: Medicare HMO | Source: Ambulatory Visit | Attending: Radiation Oncology | Admitting: Radiation Oncology

## 2016-09-03 DIAGNOSIS — Z171 Estrogen receptor negative status [ER-]: Secondary | ICD-10-CM | POA: Diagnosis not present

## 2016-09-03 DIAGNOSIS — Z79899 Other long term (current) drug therapy: Secondary | ICD-10-CM | POA: Diagnosis not present

## 2016-09-03 DIAGNOSIS — Z888 Allergy status to other drugs, medicaments and biological substances status: Secondary | ICD-10-CM | POA: Diagnosis not present

## 2016-09-03 DIAGNOSIS — Z510 Encounter for antineoplastic radiation therapy: Secondary | ICD-10-CM | POA: Diagnosis not present

## 2016-09-03 DIAGNOSIS — Z882 Allergy status to sulfonamides status: Secondary | ICD-10-CM | POA: Diagnosis not present

## 2016-09-03 DIAGNOSIS — Z950 Presence of cardiac pacemaker: Secondary | ICD-10-CM | POA: Diagnosis not present

## 2016-09-03 DIAGNOSIS — C50412 Malignant neoplasm of upper-outer quadrant of left female breast: Secondary | ICD-10-CM | POA: Diagnosis not present

## 2016-09-03 DIAGNOSIS — Z880 Allergy status to penicillin: Secondary | ICD-10-CM | POA: Diagnosis not present

## 2016-09-04 ENCOUNTER — Ambulatory Visit: Payer: Medicare HMO

## 2016-09-04 ENCOUNTER — Ambulatory Visit
Admission: RE | Admit: 2016-09-04 | Discharge: 2016-09-04 | Disposition: A | Discharge status: Home / Self Care | Payer: Medicare HMO | Source: Ambulatory Visit | Attending: Radiation Oncology | Admitting: Radiation Oncology

## 2016-09-04 DIAGNOSIS — C50412 Malignant neoplasm of upper-outer quadrant of left female breast: Secondary | ICD-10-CM | POA: Diagnosis not present

## 2016-09-04 DIAGNOSIS — Z882 Allergy status to sulfonamides status: Secondary | ICD-10-CM | POA: Diagnosis not present

## 2016-09-04 DIAGNOSIS — Z950 Presence of cardiac pacemaker: Secondary | ICD-10-CM | POA: Diagnosis not present

## 2016-09-04 DIAGNOSIS — Z510 Encounter for antineoplastic radiation therapy: Secondary | ICD-10-CM | POA: Diagnosis not present

## 2016-09-04 DIAGNOSIS — Z888 Allergy status to other drugs, medicaments and biological substances status: Secondary | ICD-10-CM | POA: Diagnosis not present

## 2016-09-04 DIAGNOSIS — Z171 Estrogen receptor negative status [ER-]: Secondary | ICD-10-CM | POA: Diagnosis not present

## 2016-09-04 DIAGNOSIS — Z79899 Other long term (current) drug therapy: Secondary | ICD-10-CM | POA: Diagnosis not present

## 2016-09-04 DIAGNOSIS — Z880 Allergy status to penicillin: Secondary | ICD-10-CM | POA: Diagnosis not present

## 2016-09-05 ENCOUNTER — Encounter: Payer: Self-pay | Admitting: Physical Therapy

## 2016-09-05 ENCOUNTER — Ambulatory Visit: Payer: Medicare HMO | Admitting: Physical Therapy

## 2016-09-05 ENCOUNTER — Ambulatory Visit
Admission: RE | Admit: 2016-09-05 | Discharge: 2016-09-05 | Disposition: A | Discharge status: Home / Self Care | Payer: Medicare HMO | Source: Ambulatory Visit | Attending: Radiation Oncology | Admitting: Radiation Oncology

## 2016-09-05 DIAGNOSIS — M25512 Pain in left shoulder: Secondary | ICD-10-CM | POA: Diagnosis not present

## 2016-09-05 DIAGNOSIS — M6281 Muscle weakness (generalized): Secondary | ICD-10-CM | POA: Diagnosis not present

## 2016-09-05 DIAGNOSIS — C50412 Malignant neoplasm of upper-outer quadrant of left female breast: Secondary | ICD-10-CM | POA: Diagnosis not present

## 2016-09-05 DIAGNOSIS — M25611 Stiffness of right shoulder, not elsewhere classified: Secondary | ICD-10-CM | POA: Diagnosis not present

## 2016-09-05 DIAGNOSIS — M25612 Stiffness of left shoulder, not elsewhere classified: Secondary | ICD-10-CM

## 2016-09-05 DIAGNOSIS — M25511 Pain in right shoulder: Secondary | ICD-10-CM | POA: Diagnosis not present

## 2016-09-05 DIAGNOSIS — Z880 Allergy status to penicillin: Secondary | ICD-10-CM | POA: Diagnosis not present

## 2016-09-05 DIAGNOSIS — Z510 Encounter for antineoplastic radiation therapy: Secondary | ICD-10-CM | POA: Diagnosis not present

## 2016-09-05 DIAGNOSIS — Z950 Presence of cardiac pacemaker: Secondary | ICD-10-CM | POA: Diagnosis not present

## 2016-09-05 DIAGNOSIS — Z171 Estrogen receptor negative status [ER-]: Secondary | ICD-10-CM | POA: Diagnosis not present

## 2016-09-05 DIAGNOSIS — Z79899 Other long term (current) drug therapy: Secondary | ICD-10-CM | POA: Diagnosis not present

## 2016-09-05 DIAGNOSIS — Z882 Allergy status to sulfonamides status: Secondary | ICD-10-CM | POA: Diagnosis not present

## 2016-09-05 DIAGNOSIS — R600 Localized edema: Secondary | ICD-10-CM | POA: Diagnosis not present

## 2016-09-05 DIAGNOSIS — Z888 Allergy status to other drugs, medicaments and biological substances status: Secondary | ICD-10-CM | POA: Diagnosis not present

## 2016-09-05 NOTE — Therapy (Signed)
[place], [place], [address] Phone: [phone]   Fax:  [phone]  Physical Therapy Treatment  Patient Details  Name: [name] MRN: [id number] Date of Birth: [birth year] Referring Provider: Dr. Sondra Come  Encounter Date: 09/05/2016      PT End of Session - 09/05/16 1205    Visit Number 5   Number of Visits 9   Date for PT Re-Evaluation 09/12/16   PT Start Time 1025   PT Stop Time 1106   PT Time Calculation (min) 41 min   Activity Tolerance Patient tolerated treatment well   Behavior During Therapy [facility] for tasks assessed/performed      Past Medical History:  Diagnosis Date  . Allergic rhinitis   . Arthritis   . Family history of breast cancer   . Family history of colon cancer   . Family history of pancreatic cancer   . GERD (gastroesophageal reflux disease)   . GI bleed   . History of basal cell cancer   . Hyperlipidemia   . Hypertension   . Hypothyroidism   . Lumbar disc disease   . Malignant neoplasm of upper-outer quadrant of left female breast ([place]) 04/11/2016  . Pacemaker   . Peptic ulcer disease   . SSS (sick sinus syndrome) ([place])   . Thyroid disease     Past Surgical History:  Procedure Laterality Date  . ABDOMINAL HYSTERECTOMY    . BREAST LUMPECTOMY WITH RADIOACTIVE SEED AND SENTINEL LYMPH NODE BIOPSY Left 06/06/2016   Procedure: LEFT BREAST LUMPECTOMY WITH RADIOACTIVE SEED AND LEFT AXILLARY SENTINEL LYMPH NODE BIOPSY;  Surgeon: Alphonsa Overall, MD;  Location: [place];  Service: General;  Laterality: Left;  . BREAST SURGERY     left  . EP IMPLANTABLE DEVICE N/A 05/15/2016   Procedure: PPM Generator Changeout;  Surgeon: Evans Lance, MD;  Location: [place] [facility];  Service: Cardiovascular;  Laterality: N/A;  . HERNIA REPAIR    . PACEMAKER INSERTION      There were no vitals filed for this visit.      Subjective Assessment - 09/05/16 1026    Subjective The exercises  are going well. I am still having problems with sleeping. I am up quite a bit but it is not as bad as before.    Pertinent History Pacemaker replaced in Dec 2017, left lumpectomy on 06/06/16, currently undergoing radiation, does not need chemotherapy   Patient Stated Goals to be in less pain and to be able to do normal things like lifting my purse and carrying groceries, to get more sleep at night because the pain in my left shoulder keeps me up at night   Currently in Pain? Yes   Pain Score 5    Pain Location Neck   Pain Orientation Right;Left;Anterior   Pain Type Chronic pain   Pain Onset 1 to 4 weeks ago   Pain Frequency Intermittent                         [facility] Adult PT Treatment/Exercise - 09/05/16 0001      Shoulder Exercises: Supine   Horizontal ABduction Strengthening;Both;5 reps;Theraband   Theraband Level (Shoulder Horizontal ABduction) Level 2 (Red)   External Rotation Strengthening;Both;5 reps;Theraband   Theraband Level (Shoulder External Rotation) Level 2 (Red)   Flexion Strengthening;Both;5 reps  narrow and wide grip   Theraband Level (Shoulder Flexion) Level 2 (Red)   Other Supine Exercises active D2  with each arm using red theraband x 10 reps each     Manual Therapy   Manual Therapy Soft tissue mobilization   Other Manual Therapy soft tissue work in prone to neck and upper back for pain relief  signif. tightness bilat. upper traps              Long Term Clinic Goals - 09/05/16 1028      CC Long Term Goal  #1   Title Pt will be able to independently verbalize lymphedema risk reduction practices   Baseline 09/05/16- pt required cueing for this   Time 4   Period Weeks   Status On-going     CC Long Term Goal  #2   Title Pt will demonstrate 160 degrees of left shoulder flexion to allow her to reach items on shelves   Baseline 112, 09/05/16- 155 degrees   Time 4   Period Weeks   Status On-going     CC Long Term Goal  #3   Title Pt will  demonstrate 160 degrees of left shoulder abduction to allow pt to reach items out to sides   Baseline 82, 09/05/16- 126 degrees   Time 4   Period Weeks   Status On-going     CC Long Term Goal  #4   Title Pt will demonstrate 160 degrees of right shoulder flexion to allow her to reach items on shelf   Baseline 128, 09/05/16- 154 degrees   Time 4   Period Weeks   Status On-going     CC Long Term Goal  #5   Title Pt will be independent in a home exercise program for continued strengthening and stretching   Time 4   Period Weeks   Status On-going            Plan - 09/05/16 1210    Clinical Impression Statement Patient has made excellent progress towards her goals in therapy. Her range of motion has improved tremendously since time of evaluation. 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 Increased resistance of supine scap exercises today. Performed soft tissue massage to posterior neck and back to help decrease pain.    Rehab Potential Good   Clinical Impairments Affecting Rehab Potential currently undergoing radiation, hx of left breast infection following surgery   PT Frequency 2x / week   PT Duration 4 weeks   PT Treatment/Interventions ADLs/Self Care Home Management;Therapeutic exercise;Patient/family education;Manual techniques;Manual lymph drainage;Scar mobilization;Passive range of motion;Vasopneumatic Device;Taping   PT Next Visit Plan  continue gentle AA/A/PROM to right and left shoulders, add 3 way shoulder   PT Home Exercise Plan post op shoulder ROM exercises, lymphedema risk reduction handout,   Consulted and Agree with Plan of Care Patient      Patient will benefit from skilled therapeutic intervention in order to improve the following deficits and impairments:  Postural dysfunction, Decreased knowledge of precautions, Pain, Impaired UE functional use, Decreased range  of motion, Increased edema, Increased fascial restricitons, Decreased strength, Decreased scar mobility  Visit Diagnosis: Acute pain of left shoulder  Stiffness of left shoulder, not elsewhere classified  Acute pain of right shoulder  Stiffness of right shoulder, not elsewhere classified  Muscle weakness (generalized)     Problem List Patient Active Problem List   Diagnosis Date Noted  . Genetic testing 05/01/2016  . Family history of breast cancer   .  Family history of pancreatic cancer   . Family history of colon cancer   . Malignant neoplasm of upper-outer quadrant of left breast in female, estrogen receptor negative ([place]) 04/11/2016  . Essential hypertension 09/06/2013  . Sick sinus syndrome ([place]) 09/06/2013  . History of permanent cardiac pacemaker placement 09/06/2013  . Hyperlipidemia 09/06/2013    Allyson Sabal [facility] 09/05/2016, 12:15 PM  [place], [place], [address] Phone: [phone]   Fax:  [phone]  Name: NOHEMI NICKLAUS MRN: [id number] Date of Birth: 26-Jun-1945  Manus Gunning, PT 09/05/16 12:15 PM

## 2016-09-08 ENCOUNTER — Ambulatory Visit
Admission: RE | Admit: 2016-09-08 | Discharge: 2016-09-08 | Disposition: A | Discharge status: Home / Self Care | Payer: Medicare HMO | Source: Ambulatory Visit | Attending: Radiation Oncology | Admitting: Radiation Oncology

## 2016-09-08 ENCOUNTER — Encounter: Payer: Medicare HMO | Admitting: Physical Therapy

## 2016-09-08 DIAGNOSIS — Z950 Presence of cardiac pacemaker: Secondary | ICD-10-CM | POA: Diagnosis not present

## 2016-09-08 DIAGNOSIS — Z510 Encounter for antineoplastic radiation therapy: Secondary | ICD-10-CM | POA: Diagnosis not present

## 2016-09-08 DIAGNOSIS — Z171 Estrogen receptor negative status [ER-]: Secondary | ICD-10-CM | POA: Diagnosis not present

## 2016-09-08 DIAGNOSIS — Z880 Allergy status to penicillin: Secondary | ICD-10-CM | POA: Diagnosis not present

## 2016-09-08 DIAGNOSIS — Z888 Allergy status to other drugs, medicaments and biological substances status: Secondary | ICD-10-CM | POA: Diagnosis not present

## 2016-09-08 DIAGNOSIS — Z79899 Other long term (current) drug therapy: Secondary | ICD-10-CM | POA: Diagnosis not present

## 2016-09-08 DIAGNOSIS — Z882 Allergy status to sulfonamides status: Secondary | ICD-10-CM | POA: Diagnosis not present

## 2016-09-08 DIAGNOSIS — C50412 Malignant neoplasm of upper-outer quadrant of left female breast: Secondary | ICD-10-CM | POA: Diagnosis not present

## 2016-09-09 ENCOUNTER — Ambulatory Visit
Admission: RE | Admit: 2016-09-09 | Discharge: 2016-09-09 | Disposition: A | Discharge status: Home / Self Care | Payer: Medicare HMO | Source: Ambulatory Visit | Attending: Radiation Oncology | Admitting: Radiation Oncology

## 2016-09-09 ENCOUNTER — Ambulatory Visit: Payer: Medicare HMO | Admitting: Radiation Oncology

## 2016-09-09 ENCOUNTER — Ambulatory Visit: Payer: Medicare HMO

## 2016-09-09 DIAGNOSIS — Z882 Allergy status to sulfonamides status: Secondary | ICD-10-CM | POA: Diagnosis not present

## 2016-09-09 DIAGNOSIS — Z171 Estrogen receptor negative status [ER-]: Secondary | ICD-10-CM | POA: Diagnosis not present

## 2016-09-09 DIAGNOSIS — Z510 Encounter for antineoplastic radiation therapy: Secondary | ICD-10-CM | POA: Diagnosis not present

## 2016-09-09 DIAGNOSIS — M6281 Muscle weakness (generalized): Secondary | ICD-10-CM | POA: Diagnosis not present

## 2016-09-09 DIAGNOSIS — C50412 Malignant neoplasm of upper-outer quadrant of left female breast: Secondary | ICD-10-CM

## 2016-09-09 DIAGNOSIS — M25511 Pain in right shoulder: Secondary | ICD-10-CM

## 2016-09-09 DIAGNOSIS — R600 Localized edema: Secondary | ICD-10-CM

## 2016-09-09 DIAGNOSIS — Z79899 Other long term (current) drug therapy: Secondary | ICD-10-CM | POA: Diagnosis not present

## 2016-09-09 DIAGNOSIS — Z880 Allergy status to penicillin: Secondary | ICD-10-CM | POA: Diagnosis not present

## 2016-09-09 DIAGNOSIS — M25612 Stiffness of left shoulder, not elsewhere classified: Secondary | ICD-10-CM

## 2016-09-09 DIAGNOSIS — M25611 Stiffness of right shoulder, not elsewhere classified: Secondary | ICD-10-CM

## 2016-09-09 DIAGNOSIS — Z888 Allergy status to other drugs, medicaments and biological substances status: Secondary | ICD-10-CM | POA: Diagnosis not present

## 2016-09-09 DIAGNOSIS — Z950 Presence of cardiac pacemaker: Secondary | ICD-10-CM | POA: Diagnosis not present

## 2016-09-09 DIAGNOSIS — M25512 Pain in left shoulder: Secondary | ICD-10-CM | POA: Diagnosis not present

## 2016-09-09 MED ORDER — BIAFINE EX EMUL
Freq: Once | CUTANEOUS | Status: AC
  Administered 2016-09-09: 17:00:00 via TOPICAL

## 2016-09-09 MED ORDER — RADIAPLEXRX EX GEL
Freq: Once | CUTANEOUS | Status: AC
  Administered 2016-09-09: 17:00:00 via TOPICAL

## 2016-09-09 NOTE — Patient Instructions (Signed)

## 2016-09-09 NOTE — Therapy (Signed)
[place], [place], [address] Phone: [phone]   Fax:  [phone]  Physical Therapy Treatment  Patient Details  Name: [name] MRN: [id number] Date of Birth: 08-03-1945 Referring Provider: Dr. Sondra Come  Encounter Date: 09/09/2016      PT End of Session - 09/09/16 0934    Visit Number 6   Number of Visits 9   Date for PT Re-Evaluation 09/12/16   PT Start Time 0853   PT Stop Time 0934   PT Time Calculation (min) 41 min   Activity Tolerance Patient tolerated treatment well   Behavior During Therapy [facility] for tasks assessed/performed      Past Medical History:  Diagnosis Date  . Allergic rhinitis   . Arthritis   . Family history of breast cancer   . Family history of colon cancer   . Family history of pancreatic cancer   . GERD (gastroesophageal reflux disease)   . GI bleed   . History of basal cell cancer   . Hyperlipidemia   . Hypertension   . Hypothyroidism   . Lumbar disc disease   . Malignant neoplasm of upper-outer quadrant of left female breast ([place]) 04/11/2016  . Pacemaker   . Peptic ulcer disease   . SSS (sick sinus syndrome) ([place])   . Thyroid disease     Past Surgical History:  Procedure Laterality Date  . ABDOMINAL HYSTERECTOMY    . BREAST LUMPECTOMY WITH RADIOACTIVE SEED AND SENTINEL LYMPH NODE BIOPSY Left 06/06/2016   Procedure: LEFT BREAST LUMPECTOMY WITH RADIOACTIVE SEED AND LEFT AXILLARY SENTINEL LYMPH NODE BIOPSY;  Surgeon: Alphonsa Overall, MD;  Location: [facility];  Service: General;  Laterality: Left;  . BREAST SURGERY     left  . EP IMPLANTABLE DEVICE N/A 05/15/2016   Procedure: PPM Generator Changeout;  Surgeon: Evans Lance, MD;  Location: [place] [facility];  Service: Cardiovascular;  Laterality: N/A;  . HERNIA REPAIR    . PACEMAKER INSERTION      There were no vitals filed for this visit.      Subjective Assessment - 09/09/16 0856    Subjective Overall I  have gotten alot better but still have flare ups and am a little frustrated by this. But I know my ROM is a lot better and I just think radiation is keeping me sore.    Pertinent History Pacemaker replaced in Dec 2017, left lumpectomy on 06/06/16, currently undergoing radiation, does not need chemotherapy   Patient Stated Goals to be in less pain and to be able to do normal things like lifting my purse and carrying groceries, to get more sleep at night because the pain in my left shoulder keeps me up at night   Currently in Pain? Yes   Pain Score 6    Pain Location Neck   Pain Orientation Left   Pain Descriptors / Indicators Throbbing   Pain Type Chronic pain   Pain Radiating Towards neck and shoulder   Pain Onset 1 to 4 weeks ago   Pain Frequency Intermittent   Aggravating Factors  lying on either side, going through radiation    Pain Relieving Factors HEP, therapy                         [facility] Adult PT Treatment/Exercise - 09/09/16 0001      Shoulder Exercises: Standing   Other Standing Exercises Leaning against wall with back, (core engaged), shoulders  and head against wall holding 2 lbs for 3 way raises to shoulder height (flexion, scaption, abduction) 10 times each     Manual Therapy   Manual Therapy Soft tissue mobilization   Passive ROM to left shoulder in direction of flexion, abduction and D2 to pts tolerance   Other Manual Therapy soft tissue work in prone to neck and upper back for pain relief                PT Education - 09/09/16 0904    Education provided Yes   Education Details Standing 3 way raises with 2 lbs   Person(s) Educated Patient   Methods Explanation;Demonstration;Handout   Comprehension Verbalized understanding;Returned demonstration              Breast Clinic Goals - 04/16/16 1207      Patient will be able to verbalize understanding of pertinent lymphedema risk reduction practices relevant to her diagnosis specifically  related to skin care.   Time 1   Period Days   Status Achieved     Patient will be able to return demonstrate and/or verbalize understanding of the post-op home exercise program related to regaining shoulder range of motion.   Time 1   Period Days   Status Achieved     Patient will be able to verbalize understanding of the importance of attending the postoperative After Breast Cancer Class for further lymphedema risk reduction education and therapeutic exercise.   Time 1   Period Days   Status Achieved          Long Term Clinic Goals - 09/05/16 1028      CC Long Term Goal  #1   Title Pt will be able to independently verbalize lymphedema risk reduction practices   Baseline 09/05/16- pt required cueing for this   Time 4   Period Weeks   Status On-going     CC Long Term Goal  #2   Title Pt will demonstrate 160 degrees of left shoulder flexion to allow her to reach items on shelves   Baseline 112, 09/05/16- 155 degrees   Time 4   Period Weeks   Status On-going     CC Long Term Goal  #3   Title Pt will demonstrate 160 degrees of left shoulder abduction to allow pt to reach items out to sides   Baseline 82, 09/05/16- 126 degrees   Time 4   Period Weeks   Status On-going     CC Long Term Goal  #4   Title Pt will demonstrate 160 degrees of right shoulder flexion to allow her to reach items on shelf   Baseline 128, 09/05/16- 154 degrees   Time 4   Period Weeks   Status On-going     CC Long Term Goal  #5   Title Pt will be independent in a home exercise program for continued strengthening and stretching   Time 4   Period Weeks   Status On-going            Plan - 09/09/16 0934    Clinical Impression Statement PT reports noticing she has overall made good progress but gets frustrated at times about still having flare ups of tightness. She does seem to recognize that radiation is probably biggest contributor at this time and is hoping she feinishes this week . Her end P/ROM  is much improved since this therapist saw her a few weeks ago and she is making good progress. Discussed with her possibility  of D/C or being on hold as she is nearing the end of her cert this week and pt would like to be on hold for 1-2 weeks to assure she continues to recover well after finishing radiation. Progressed HEP to include 3 way raises and pt to alternate these with scapular series.    Rehab Potential Good   Clinical Impairments Affecting Rehab Potential currently undergoing radiation, hx of left breast infection following surgery   PT Frequency 2x / week   PT Duration 4 weeks   PT Treatment/Interventions ADLs/Self Care Home Management;Therapeutic exercise;Patient/family education;Manual techniques;Manual lymph drainage;Scar mobilization;Passive range of motion;Vasopneumatic Device;Taping   PT Next Visit Plan  continue gentle AA/A/PROM to right and left shoulders, review prn 3 way shoulder; D/C or on hold next visit   Consulted and Agree with Plan of Care Patient      Patient will benefit from skilled therapeutic intervention in order to improve the following deficits and impairments:  Postural dysfunction, Decreased knowledge of precautions, Pain, Impaired UE functional use, Decreased range of motion, Increased edema, Increased fascial restricitons, Decreased strength, Decreased scar mobility  Visit Diagnosis: Acute pain of left shoulder  Stiffness of left shoulder, not elsewhere classified  Muscle weakness (generalized)  Localized edema  Acute pain of right shoulder  Stiffness of right shoulder, not elsewhere classified     Problem List Patient Active Problem List   Diagnosis Date Noted  . Genetic testing 05/01/2016  . Family history of breast cancer   . Family history of pancreatic cancer   . Family history of colon cancer   . Malignant neoplasm of upper-outer quadrant of left breast in female, estrogen receptor negative ([place]) 04/11/2016  . Essential hypertension  09/06/2013  . Sick sinus syndrome ([place]) 09/06/2013  . History of permanent cardiac pacemaker placement 09/06/2013  . Hyperlipidemia 09/06/2013    Otelia Limes, PTA 09/09/2016, 9:38 AM  [place], [place], [address] Phone: [phone]   Fax:  [phone]  Name: JULIENE KIRSH MRN: [id number] Date of Birth: 1945/09/15

## 2016-09-10 ENCOUNTER — Ambulatory Visit
Admission: RE | Admit: 2016-09-10 | Discharge: 2016-09-10 | Disposition: A | Discharge status: Home / Self Care | Payer: Medicare HMO | Source: Ambulatory Visit | Attending: Radiation Oncology | Admitting: Radiation Oncology

## 2016-09-10 DIAGNOSIS — Z171 Estrogen receptor negative status [ER-]: Secondary | ICD-10-CM | POA: Diagnosis not present

## 2016-09-10 DIAGNOSIS — Z888 Allergy status to other drugs, medicaments and biological substances status: Secondary | ICD-10-CM | POA: Diagnosis not present

## 2016-09-10 DIAGNOSIS — Z79899 Other long term (current) drug therapy: Secondary | ICD-10-CM | POA: Diagnosis not present

## 2016-09-10 DIAGNOSIS — Z510 Encounter for antineoplastic radiation therapy: Secondary | ICD-10-CM | POA: Diagnosis not present

## 2016-09-10 DIAGNOSIS — Z882 Allergy status to sulfonamides status: Secondary | ICD-10-CM | POA: Diagnosis not present

## 2016-09-10 DIAGNOSIS — Z880 Allergy status to penicillin: Secondary | ICD-10-CM | POA: Diagnosis not present

## 2016-09-10 DIAGNOSIS — C50412 Malignant neoplasm of upper-outer quadrant of left female breast: Secondary | ICD-10-CM | POA: Diagnosis not present

## 2016-09-10 DIAGNOSIS — Z950 Presence of cardiac pacemaker: Secondary | ICD-10-CM | POA: Diagnosis not present

## 2016-09-11 ENCOUNTER — Ambulatory Visit: Payer: Medicare HMO

## 2016-09-11 ENCOUNTER — Ambulatory Visit
Admission: RE | Admit: 2016-09-11 | Discharge: 2016-09-11 | Disposition: A | Discharge status: Home / Self Care | Payer: Medicare HMO | Source: Ambulatory Visit | Attending: Radiation Oncology | Admitting: Radiation Oncology

## 2016-09-11 DIAGNOSIS — Z510 Encounter for antineoplastic radiation therapy: Secondary | ICD-10-CM | POA: Diagnosis not present

## 2016-09-11 DIAGNOSIS — Z888 Allergy status to other drugs, medicaments and biological substances status: Secondary | ICD-10-CM | POA: Diagnosis not present

## 2016-09-11 DIAGNOSIS — Z171 Estrogen receptor negative status [ER-]: Secondary | ICD-10-CM | POA: Diagnosis not present

## 2016-09-11 DIAGNOSIS — Z882 Allergy status to sulfonamides status: Secondary | ICD-10-CM | POA: Diagnosis not present

## 2016-09-11 DIAGNOSIS — C50412 Malignant neoplasm of upper-outer quadrant of left female breast: Secondary | ICD-10-CM | POA: Diagnosis not present

## 2016-09-11 DIAGNOSIS — Z79899 Other long term (current) drug therapy: Secondary | ICD-10-CM | POA: Diagnosis not present

## 2016-09-11 DIAGNOSIS — Z950 Presence of cardiac pacemaker: Secondary | ICD-10-CM | POA: Diagnosis not present

## 2016-09-11 DIAGNOSIS — Z880 Allergy status to penicillin: Secondary | ICD-10-CM | POA: Diagnosis not present

## 2016-09-12 ENCOUNTER — Ambulatory Visit: Payer: Medicare HMO | Admitting: Physical Therapy

## 2016-09-12 ENCOUNTER — Ambulatory Visit
Admission: RE | Admit: 2016-09-12 | Discharge: 2016-09-12 | Disposition: A | Discharge status: Home / Self Care | Payer: Medicare HMO | Source: Ambulatory Visit | Attending: Radiation Oncology | Admitting: Radiation Oncology

## 2016-09-12 ENCOUNTER — Telehealth: Payer: Self-pay | Admitting: *Deleted

## 2016-09-12 DIAGNOSIS — Z510 Encounter for antineoplastic radiation therapy: Secondary | ICD-10-CM | POA: Diagnosis not present

## 2016-09-12 DIAGNOSIS — C50412 Malignant neoplasm of upper-outer quadrant of left female breast: Secondary | ICD-10-CM | POA: Diagnosis not present

## 2016-09-12 DIAGNOSIS — M25512 Pain in left shoulder: Secondary | ICD-10-CM

## 2016-09-12 DIAGNOSIS — M25612 Stiffness of left shoulder, not elsewhere classified: Secondary | ICD-10-CM | POA: Diagnosis not present

## 2016-09-12 DIAGNOSIS — M25611 Stiffness of right shoulder, not elsewhere classified: Secondary | ICD-10-CM | POA: Diagnosis not present

## 2016-09-12 DIAGNOSIS — Z882 Allergy status to sulfonamides status: Secondary | ICD-10-CM | POA: Diagnosis not present

## 2016-09-12 DIAGNOSIS — M6281 Muscle weakness (generalized): Secondary | ICD-10-CM | POA: Diagnosis not present

## 2016-09-12 DIAGNOSIS — Z880 Allergy status to penicillin: Secondary | ICD-10-CM | POA: Diagnosis not present

## 2016-09-12 DIAGNOSIS — Z79899 Other long term (current) drug therapy: Secondary | ICD-10-CM | POA: Diagnosis not present

## 2016-09-12 DIAGNOSIS — R600 Localized edema: Secondary | ICD-10-CM | POA: Diagnosis not present

## 2016-09-12 DIAGNOSIS — Z950 Presence of cardiac pacemaker: Secondary | ICD-10-CM | POA: Diagnosis not present

## 2016-09-12 DIAGNOSIS — Z888 Allergy status to other drugs, medicaments and biological substances status: Secondary | ICD-10-CM | POA: Diagnosis not present

## 2016-09-12 DIAGNOSIS — M25511 Pain in right shoulder: Secondary | ICD-10-CM

## 2016-09-12 DIAGNOSIS — Z171 Estrogen receptor negative status [ER-]: Secondary | ICD-10-CM | POA: Diagnosis not present

## 2016-09-12 NOTE — Telephone Encounter (Signed)
Called pt to congratulate on completion of xrt. Relate doing well and without complaints. Discuss survivorship program and next steps. Received verbal understanding. Encourage pt to call with needs.

## 2016-09-12 NOTE — Therapy (Addendum)
[place], [place], [address] Phone: [phone]   Fax:  [phone]  Physical Therapy Treatment  Patient Details  Name: [name] MRN: [id number] Date of Birth: 08/19/1945 Referring Provider: Dr. Sondra Come  Encounter Date: 09/12/2016      PT End of Session - 09/12/16 1137    Visit Number 7   Number of Visits 9   Date for PT Re-Evaluation 09/12/16   PT Start Time 8088   PT Stop Time 0933   PT Time Calculation (min) 46 min   Activity Tolerance Patient tolerated treatment well   Behavior During Therapy [facility] for tasks assessed/performed      Past Medical History:  Diagnosis Date  . Allergic rhinitis   . Arthritis   . Family history of breast cancer   . Family history of colon cancer   . Family history of pancreatic cancer   . GERD (gastroesophageal reflux disease)   . GI bleed   . History of basal cell cancer   . Hyperlipidemia   . Hypertension   . Hypothyroidism   . Lumbar disc disease   . Malignant neoplasm of upper-outer quadrant of left female breast ([place]) 04/11/2016  . Pacemaker   . Peptic ulcer disease   . SSS (sick sinus syndrome) ([place])   . Thyroid disease     Past Surgical History:  Procedure Laterality Date  . ABDOMINAL HYSTERECTOMY    . BREAST LUMPECTOMY WITH RADIOACTIVE SEED AND SENTINEL LYMPH NODE BIOPSY Left 06/06/2016   Procedure: LEFT BREAST LUMPECTOMY WITH RADIOACTIVE SEED AND LEFT AXILLARY SENTINEL LYMPH NODE BIOPSY;  Surgeon: Alphonsa Overall, MD;  Location: [place] [facility];  Service: General;  Laterality: Left;  . BREAST SURGERY     left  . EP IMPLANTABLE DEVICE N/A 05/15/2016   Procedure: PPM Generator Changeout;  Surgeon: Evans Lance, MD;  Location: [facility];  Service: Cardiovascular;  Laterality: N/A;  . HERNIA REPAIR    . PACEMAKER INSERTION      There were no vitals filed for this visit.      Subjective Assessment - 09/12/16 0851    Subjective Everything's  about the same.  Has done the new strengthening exercises at home and doesn't have questions about that.  "I can see how beneficial this can be as a lifestyle change." Ready to be on hold with therapy for now and will call us in a week or two about what she might need.   Currently in Pain? 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     Manual Therapy   Myofascial Release left UE myofascial pulling in supine with movement into abduction   Passive ROM to left shoulder in er, abduction and flexion   Other Manual Therapy soft tissue work in prone to neck and upper back for pain relief  PT Education - 09/12/16 1136    Education provided Yes   Education Details body mechanics for some household chores (and her work cleaning houses)   [employer]) Educated Patient   Methods Explanation;Demonstration;Handout   Comprehension Verbalized understanding              Breast Clinic Goals - 04/16/16 1207      Patient will be able to verbalize understanding of pertinent lymphedema risk reduction practices relevant to her diagnosis specifically related to skin care.   Time 1   Period Days   Status Achieved     Patient will be able to return demonstrate and/or verbalize understanding of the post-op home exercise program related to regaining shoulder range of motion.   Time 1   Period Days   Status Achieved     Patient will be able to verbalize understanding of the importance of attending the postoperative  After Breast Cancer Class for further lymphedema risk reduction education and therapeutic exercise.   Time 1   Period Days   Status Achieved          Long Term Clinic Goals - 09/05/16 1028      CC Long Term Goal  #1   Title Pt will be able to independently verbalize lymphedema risk reduction practices   Baseline 09/05/16- pt required cueing for this   Time 4   Period Weeks   Status On-going     CC Long Term Goal  #2   Title Pt will demonstrate 160 degrees of left shoulder flexion to allow her to reach items on shelves   Baseline 112, 09/05/16- 155 degrees   Time 4   Period Weeks   Status On-going     CC Long Term Goal  #3   Title Pt will demonstrate 160 degrees of left shoulder abduction to allow pt to reach items out to sides   Baseline 82, 09/05/16- 126 degrees   Time 4   Period Weeks   Status On-going     CC Long Term Goal  #4   Title Pt will demonstrate 160 degrees of right shoulder flexion to allow her to reach items on shelf   Baseline 128, 09/05/16- 154 degrees   Time 4   Period Weeks   Status On-going     CC Long Term Goal  #5   Title Pt will be independent in a home exercise program for continued strengthening and stretching   Time 4   Period Weeks   Status On-going            Plan - 09/12/16 1137    Clinical Impression Statement She is doing pretty well, though still with pain in her neck and shoulder area.  She was given information today about a massage therapist she might call.  She feels confident about her home exercise program.  She is ready to work on her own for a week or two to see if she can do this independently and be discharged, or whether she might need more therapy; she plans to call us in a week or two to let us know.   Rehab Potential Good   Clinical Impairments Affecting Rehab Potential currently undergoing radiation (to finish 09/12/16), hx of left breast infection following surgery   PT Frequency 2x / week   PT Duration 4 weeks   PT  Treatment/Interventions ADLs/Self Care Home Management;Therapeutic exercise;Patient/family education;Manual techniques;Manual lymph drainage;Scar mobilization;Passive range of motion;Vasopneumatic Device;Taping   PT Next Visit Plan She is on  hold to work on her HEP; if she returns to therapy, will need renewal.  Continue P/AA/A/ROM to shoulders, myofascial release, and soft tissue work   PT Home Exercise Plan post op shoulder ROM exercises, lymphedema risk reduction handout,   Consulted and Agree with Plan of Care Patient      Patient will benefit from skilled therapeutic intervention in order to improve the following deficits and impairments:  Postural dysfunction, Decreased knowledge of precautions, Pain, Impaired UE functional use, Decreased range of motion, Increased edema, Increased fascial restricitons, Decreased strength, Decreased scar mobility  Visit Diagnosis: Acute pain of left shoulder  Stiffness of left shoulder, not elsewhere classified  Acute pain of right shoulder     Problem List Patient Active Problem List   Diagnosis Date Noted  . Genetic testing 05/01/2016  . Family history of breast cancer   . Family history of pancreatic cancer   . Family history of colon cancer   . Malignant neoplasm of upper-outer quadrant of left breast in female, estrogen receptor negative ([place]) 04/11/2016  . Essential hypertension 09/06/2013  . Sick sinus syndrome ([place]) 09/06/2013  . History of permanent cardiac pacemaker placement 09/06/2013  . Hyperlipidemia 09/06/2013    SALISBURY,DONNA 09/12/2016, 11:42 AM  [place], [place], [address] Phone: [phone]   Fax:  [phone]  Name: [name] MRN: [id number] Date of Birth: 03-24-1946  Serafina Royals, PT 09/12/16 11:42 AM  PHYSICAL THERAPY DISCHARGE SUMMARY  Visits from Start of Care: 7  Current functional level related to goals /  functional outcomes: Goals partially met.   Remaining deficits: Unknown.  The patient, at the time of her last visit, was considering trying massage therapy to help neck pain.  She was going to go for a week or two to see how she was doing, then let us know if she felt ready for discharge or felt she needed more therapy.  We did not see her again.   Education / Equipment: Home exercise program.  Plan: Patient agrees to discharge.  Patient goals were partially met. Patient is being discharged due to not returning since the last visit.  ?????    Serafina Royals, PT 07/17/17 9:13 AM

## 2016-09-15 ENCOUNTER — Encounter: Payer: Self-pay | Admitting: Radiation Oncology

## 2016-09-15 NOTE — Progress Notes (Signed)
  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 During treatment the patient reported itching to the treatment area. She had hyperpigmentation without desquamation on the left breast.  Plan: The patient has completed radiation treatment. The patient will return to radiation oncology clinic for routine followup in one month. I advised them to call or return sooner if they have any questions or concerns related to their recovery or treatment.  -----------------------------------  Blair Promise, PhD, MD  This document serves as a record of services personally performed by Gery Pray, MD. It was created on his behalf by Bethann Humble, a trained medical scribe. The creation of this record is based on the scribe's personal observations and the provider's statements to them. This document has been checked and approved by the attending provider.

## 2016-09-17 ENCOUNTER — Encounter: Payer: Self-pay | Admitting: Internal Medicine

## 2016-09-17 ENCOUNTER — Encounter (INDEPENDENT_AMBULATORY_CARE_PROVIDER_SITE_OTHER): Payer: Self-pay

## 2016-09-17 ENCOUNTER — Ambulatory Visit (INDEPENDENT_AMBULATORY_CARE_PROVIDER_SITE_OTHER): Payer: Medicare HMO | Admitting: Internal Medicine

## 2016-09-17 VITALS — BP 150/80 | HR 86 | Ht 63.0 in | Wt 157.0 lb

## 2016-09-17 DIAGNOSIS — Z950 Presence of cardiac pacemaker: Secondary | ICD-10-CM | POA: Diagnosis not present

## 2016-09-17 DIAGNOSIS — I10 Essential (primary) hypertension: Secondary | ICD-10-CM | POA: Diagnosis not present

## 2016-09-17 LAB — CUP PACEART INCLINIC DEVICE CHECK
Battery Impedance: 100 Ohm
Brady Statistic AS VS Percent: 12 %
Date Time Interrogation Session: 20180418093402
Implantable Lead Implant Date: 19970926
Implantable Lead Location: 753859
Implantable Lead Location: 753860
Implantable Lead Model: 4024
Implantable Lead Model: 4524
Lead Channel Impedance Value: 346 Ohm
Lead Channel Pacing Threshold Pulse Width: 0.4 ms
Lead Channel Sensing Intrinsic Amplitude: 11.2 mV
Lead Channel Sensing Intrinsic Amplitude: 2 mV
Lead Channel Setting Pacing Amplitude: 2.5 V
Lead Channel Setting Pacing Amplitude: 3 V
Lead Channel Setting Pacing Pulse Width: 0.4 ms
MDC IDC LEAD IMPLANT DT: 19970926
MDC IDC MSMT BATTERY REMAINING LONGEVITY: 114 mo
MDC IDC MSMT BATTERY VOLTAGE: 2.78 V
MDC IDC MSMT LEADCHNL RA PACING THRESHOLD AMPLITUDE: 1 V
MDC IDC MSMT LEADCHNL RA PACING THRESHOLD PULSEWIDTH: 0.4 ms
MDC IDC MSMT LEADCHNL RV IMPEDANCE VALUE: 869 Ohm
MDC IDC MSMT LEADCHNL RV PACING THRESHOLD AMPLITUDE: 0.5 V
MDC IDC PG IMPLANT DT: 20171214
MDC IDC SET LEADCHNL RV SENSING SENSITIVITY: 5.6 mV
MDC IDC STAT BRADY AP VP PERCENT: 1 %
MDC IDC STAT BRADY AP VS PERCENT: 86 %
MDC IDC STAT BRADY AS VP PERCENT: 0 %

## 2016-09-17 NOTE — Patient Instructions (Addendum)
Medication Instructions:  Your physician recommends that you continue on your current medications as directed. Please refer to the Current Medication list given to you today.   Labwork: None ordered  Testing/Procedures: None ordered  Follow-Up: Your physician wants you to follow-up in: 6 months with the device clinic You will receive a reminder letter in the mail two months in advance. If you don't receive a letter, please call our office to schedule the follow-up appointment.     Your physician wants you to follow-up in: 9 months with Dr. Caryl Comes. You will receive a reminder letter in the mail two months in advance. If you don't receive a letter, please call our office to schedule the follow-up appointment.   Any Other Special Instructions Will Be Listed Below (If Applicable).     If you need a refill on your cardiac medications before your next appointment, please call your pharmacy.

## 2016-09-17 NOTE — Progress Notes (Signed)
HPI [name] returns today for followup. She is a pleasant 71 yo woman who has a h/o sinus node dysfunction, s/p PPM insertion who underwent PM pocket revision prior to XRT for breast CA. She has done well in the interim. No chest pain or sob. No problems healing.  Allergies  Allergen Reactions  . Nsaids Other (See Comments)    Upper GI bleed  . Penicillins Itching    Has patient had a PCN reaction causing immediate rash, facial/tongue/throat swelling, SOB or lightheadedness with hypotension:No Has patient had a PCN reaction causing severe rash involving mucus membranes or skin necrosis:No Has patient had a PCN reaction that required hospitalization:No Has patient had a PCN reaction occurring within the last 10 years:No If all of the above answers are "NO", then may proceed with Cephalosporin use.   . Bee Venom   . Sulfa Antibiotics     itching  . Wasp Venom      Current Outpatient Prescriptions  Medication Sig Dispense Refill  . acetaminophen (TYLENOL) 500 MG tablet Take 1,000 mg by mouth every 6 (six) hours as needed.    Marland Kitchen albuterol (PROVENTIL HFA;VENTOLIN HFA) 108 (90 BASE) MCG/ACT inhaler Inhale into the lungs every 6 (six) hours as needed for wheezing or shortness of breath.    Marland Kitchen albuterol (PROVENTIL) (2.5 MG/3ML) 0.083% nebulizer solution Take 2.5 mg by nebulization every 6 (six) hours as needed for wheezing or shortness of breath.    Marland Kitchen atorvastatin (LIPITOR) 80 MG tablet Take 80 mg by mouth every evening.     Marland Kitchen azelastine (ASTELIN) 137 MCG/SPRAY nasal spray Place 2 sprays into both nostrils 2 (two) times daily as needed for allergies. Use in each nostril as directed    . DIHYDROERGOTAMINE MESYLATE NA Inject 2 mLs into the muscle daily as needed (for migraine headaches).     . doxycycline (VIBRA-TABS) 100 MG tablet Take 1 tablet (100 mg total) by mouth 2 (two) times daily. 20 tablet 0  . emollient (BIAFINE) cream Apply topically 2 (two) times daily.    . fenofibrate 160  MG tablet Take 160 mg by mouth every evening.     . Fish Oil OIL Take 1,000 mg by mouth daily.     . hyaluronate sodium (RADIAPLEXRX) GEL Apply 1 application topically 2 (two) times daily.    Marland Kitchen HYDROcodone-acetaminophen (NORCO/VICODIN) 5-325 MG tablet Take 1-2 tablets by mouth every 6 (six) hours as needed. 20 tablet 0  . levothyroxine (SYNTHROID, LEVOTHROID) 100 MCG tablet Take 100 mcg by mouth daily before breakfast.    . mometasone (NASONEX) 50 MCG/ACT nasal spray Place 2 sprays into the nose daily as needed (for nasal congestion.).     Marland Kitchen non-metallic deodorant (ALRA) MISC Apply 1 application topically daily as needed.    Marland Kitchen omeprazole (PRILOSEC) 20 MG capsule Take 20 mg by mouth daily.    . promethazine (PHENERGAN) 25 MG tablet Take 25 mg by mouth every 6 (six) hours as needed for nausea or vomiting.    . triamterene-hydrochlorothiazide (MAXZIDE) 75-50 MG tablet Take 1 tablet by mouth every evening.     . Vitamin D, Cholecalciferol, 1000 UNITS TABS Take 1,000 Units by mouth daily.      No current facility-administered medications for this visit.      Past Medical History:  Diagnosis Date  . Allergic rhinitis   . Arthritis   . Family history of breast cancer   . Family history of colon cancer   . Family  history of pancreatic cancer   . GERD (gastroesophageal reflux disease)   . GI bleed   . History of basal cell cancer   . Hyperlipidemia   . Hypertension   . Hypothyroidism   . Lumbar disc disease   . Malignant neoplasm of upper-outer quadrant of left female breast ([place]) 04/11/2016  . Pacemaker   . Peptic ulcer disease   . SSS (sick sinus syndrome) ([place])   . Thyroid disease     ROS:   All systems reviewed and negative except as noted in the HPI.   Past Surgical History:  Procedure Laterality Date  . ABDOMINAL HYSTERECTOMY    . BREAST LUMPECTOMY WITH RADIOACTIVE SEED AND SENTINEL LYMPH NODE BIOPSY Left 06/06/2016   Procedure: LEFT BREAST LUMPECTOMY WITH RADIOACTIVE SEED  AND LEFT AXILLARY SENTINEL LYMPH NODE BIOPSY;  Surgeon: Alphonsa Overall, MD;  Location: [facility];  Service: General;  Laterality: Left;  . BREAST SURGERY     left  . EP IMPLANTABLE DEVICE N/A 05/15/2016   Procedure: PPM Generator Changeout;  Surgeon: Evans Lance, MD;  Location: [facility];  Service: Cardiovascular;  Laterality: N/A;  . HERNIA REPAIR    . PACEMAKER INSERTION       Family History  Problem Relation Age of Onset  . Congestive Heart Failure Mother   . Hypertension Father   . Bone cancer Father   . Breast cancer Sister 57  . Bradycardia Cousin   . Breast cancer Cousin 26  . Breast cancer Cousin 75  . Colon cancer Cousin 59  . Pancreatic cancer Paternal Aunt 76  . Rectal cancer Paternal Aunt 53  . Heart attack Maternal Grandmother   . Heart attack Paternal Grandmother   . Stroke Paternal Grandfather   . Melanoma Daughter     dx in her 28s     Social History   Social History  . Marital status: Married    Spouse name: N/A  . Number of children: 2  . Years of education: N/A   Occupational History  . house cleaner    Social History Main Topics  . Smoking status: Never Smoker  . Smokeless tobacco: Never Used  . Alcohol use No  . Drug use: No  . Sexual activity: No   Other Topics Concern  . Not on file   Social History Narrative  . No narrative on file     BP (!) 150/80   Pulse 86   Ht 5\' 3"  (1.6 m)   Wt 157 lb (71.2 kg)   SpO2 97%   BMI 27.81 kg/m   Physical Exam:  Well appearing NAD HEENT: Unremarkable Neck:  No JVD, no thyromegally Lymphatics:  No adenopathy Back:  No CVA tenderness Lungs:  Clear with no wheezes and well healed pockets on the right and left. HEART:  Regular rate rhythm, no murmurs, no rubs, no clicks Abd:  soft, positive bowel sounds, no organomegally, no rebound, no guarding Ext:  2 plus pulses, no edema, no cyanosis, no clubbing Skin:  No rashes no nodules Neuro:  CN II through XII intact, motor grossly  intact  EKG - NSR with atrial pacing and LBBB  DEVICE  Normal device function.  See PaceArt for details.   Assess/Plan: 1. Sinus node dysfunction - she is asymptomatic 2. PPM - her medtronic DDD PM is working normally. She will followup with Dr. Renaldo Reel 3. Breast CA - she is s/p treatment and appears to be doing well.  Mikle Bosworth.D.

## 2016-09-25 DIAGNOSIS — H8110 Benign paroxysmal vertigo, unspecified ear: Secondary | ICD-10-CM | POA: Diagnosis not present

## 2016-10-02 ENCOUNTER — Encounter: Payer: Self-pay | Admitting: Radiation Oncology

## 2016-10-06 ENCOUNTER — Ambulatory Visit: Admission: RE | Admit: 2016-10-06 | Payer: Medicare HMO | Source: Ambulatory Visit | Admitting: Radiation Oncology

## 2016-10-06 ENCOUNTER — Telehealth: Payer: Self-pay | Admitting: Oncology

## 2016-10-06 HISTORY — DX: Personal history of irradiation: Z92.3

## 2016-10-06 NOTE — Telephone Encounter (Signed)
Left message for patient regarding her follow up appointment today.  Requested a return call.

## 2016-10-15 ENCOUNTER — Ambulatory Visit
Admission: RE | Admit: 2016-10-15 | Discharge: 2016-10-15 | Disposition: A | Discharge status: Home / Self Care | Payer: Medicare HMO | Source: Ambulatory Visit | Attending: Radiation Oncology | Admitting: Radiation Oncology

## 2016-10-15 ENCOUNTER — Encounter: Payer: Self-pay | Admitting: Radiation Oncology

## 2016-10-15 DIAGNOSIS — Z91030 Bee allergy status: Secondary | ICD-10-CM | POA: Diagnosis not present

## 2016-10-15 DIAGNOSIS — Z950 Presence of cardiac pacemaker: Secondary | ICD-10-CM | POA: Diagnosis not present

## 2016-10-15 DIAGNOSIS — Y842 Radiological procedure and radiotherapy as the cause of abnormal reaction of the patient, or of later complication, without mention of misadventure at the time of the procedure: Secondary | ICD-10-CM | POA: Insufficient documentation

## 2016-10-15 DIAGNOSIS — Z886 Allergy status to analgesic agent status: Secondary | ICD-10-CM | POA: Insufficient documentation

## 2016-10-15 DIAGNOSIS — Z9889 Other specified postprocedural states: Secondary | ICD-10-CM | POA: Insufficient documentation

## 2016-10-15 DIAGNOSIS — C50412 Malignant neoplasm of upper-outer quadrant of left female breast: Secondary | ICD-10-CM

## 2016-10-15 DIAGNOSIS — Z882 Allergy status to sulfonamides status: Secondary | ICD-10-CM | POA: Diagnosis not present

## 2016-10-15 DIAGNOSIS — Z880 Allergy status to penicillin: Secondary | ICD-10-CM | POA: Diagnosis not present

## 2016-10-15 DIAGNOSIS — Z171 Estrogen receptor negative status [ER-]: Secondary | ICD-10-CM | POA: Insufficient documentation

## 2016-10-15 NOTE — Progress Notes (Signed)
[name] is here for follow up after treatment to her left breast.  She reports having throbbing/burning pain in her left axilla area that wakes her up at night.  She also reports having some sharp pains in her upper left breast.  She reports her energy level is improving.  The skin on her left breast is pink with some dry peeling areas.  She is using a vitamin E lotion.    BP 119/70 (BP Location: Right Arm, Patient Position: Sitting)   Pulse 64   Temp 97.7 F (36.5 C) (Oral)   Ht 5\' 3"  (1.6 m)   Wt 157 lb (71.2 kg)   SpO2 98%   BMI 27.81 kg/m   Wt Readings from Last 3 Encounters:  10/15/16 157 lb (71.2 kg)  09/17/16 157 lb (71.2 kg)  09/02/16 156 lb 9.6 oz (71 kg)

## 2016-10-15 NOTE — Progress Notes (Signed)
Radiation Oncology         [phone] ________________________________  Name: [name] MRN: [id number]  Date: 10/15/2016  DOB: 1945/08/12   Follow-Up Visit Note  CC: [name], MD  Magrinat, Virgie Dad, MD    ICD-9-CM ICD-10-CM   1. 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    On review of systems, patient endorses pain along axilla that has been consistent in severity since surgery. She treats this with Tylenol.                         ALLERGIES:  is allergic to nsaids; penicillins; bee venom; sulfa antibiotics; and wasp venom.  Meds: Current Outpatient Prescriptions  Medication Sig Dispense Refill  . acetaminophen (TYLENOL) 500 MG tablet Take 1,000 mg by mouth every 6 (six) hours as needed.    Marland Kitchen albuterol (PROVENTIL HFA;VENTOLIN HFA) 108 (90 BASE) MCG/ACT inhaler Inhale into the lungs every 6 (six) hours as needed for wheezing or shortness of breath.    Marland Kitchen atorvastatin (LIPITOR) 80 MG tablet Take 80 mg by mouth every evening.     Marland Kitchen azelastine (ASTELIN) 137 MCG/SPRAY nasal spray Place 2 sprays into both nostrils 2 (two) times daily as needed for allergies. Use in each nostril as directed    . fenofibrate 160 MG tablet Take 160 mg by mouth every evening.     . Fish Oil OIL Take 1,000 mg by mouth daily.     Marland Kitchen levothyroxine (SYNTHROID, LEVOTHROID) 100 MCG tablet Take 100 mcg by mouth daily before breakfast.    . mometasone (NASONEX) 50 MCG/ACT nasal spray Place 2 sprays into the nose daily as needed (for nasal congestion.).     Marland Kitchen omeprazole (PRILOSEC) 20 MG capsule Take 20 mg by mouth daily.    Marland Kitchen triamterene-hydrochlorothiazide (MAXZIDE) 75-50 MG tablet Take 1  tablet by mouth every evening.     . Vitamin D, Cholecalciferol, 1000 UNITS TABS Take 1,000 Units by mouth daily.     Marland Kitchen albuterol (PROVENTIL) (2.5 MG/3ML) 0.083% nebulizer solution Take 2.5 mg by nebulization every 6 (six) hours as needed for wheezing or shortness of breath.    Marland Kitchen DIHYDROERGOTAMINE MESYLATE NA Inject 2 mLs into the muscle daily as needed (for migraine headaches).     Marland Kitchen HYDROcodone-acetaminophen (NORCO/VICODIN) 5-325 MG tablet Take 1-2 tablets by mouth every 6 (six) hours as needed. (Patient not taking: Reported on 10/15/2016) 20 tablet 0  . promethazine (PHENERGAN) 25 MG tablet Take 25 mg by mouth every 6 (six) hours as needed for nausea or vomiting.     No current facility-administered medications for this encounter.     Physical Findings: The patient is in no acute distress. Patient is alert and oriented.  height is '5\' 3"'$  (1.6 m) and weight is 157 lb (71.2 kg). Her oral temperature is 97.7 F (36.5 C). Her blood pressure is 119/70 and her pulse is 64. Her oxygen saturation is 98%. .  Lungs are clear to auscultation bilaterally. Heart has regular rate and rhythm. No palpable supraclavicular, or axillary adenopathy. Left breast: continues to have hyperpigmentation changes. Skin is well healed. Some  induration at lumpectomy site at upper outer quadrant consistent with scar tissue. No signs of recurrence within the left breast. Right breast: no palpable mass or nipple discharge. Pacemaker in place at right upper chest.     Lab Findings: Lab Results  Component Value Date   WBC 5.2 05/30/2016   HGB 13.6 05/30/2016   HCT 40.1 05/30/2016   MCV 86.1 05/30/2016   PLT 334 05/30/2016    Radiographic Findings: No results found.  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 The patient is recovering from the effects of radiation.  No evidence of recurrence on clinical exam.  Plan:  Routine follow-up with radiation  oncology in 6 months. Pt will undergo survivorship evaluation in August.  -----------------------------------  Blair Promise, PhD, MD  This document serves as a record of services personally performed by Gery Pray, MD. It was created on his behalf by Linward Natal, a trained medical scribe. The creation of this record is based on the scribe's personal observations and the provider's statements to them. This document has been checked and approved by the attending provider.

## 2016-10-23 ENCOUNTER — Ambulatory Visit: Payer: Self-pay | Admitting: Radiation Oncology

## 2016-11-05 ENCOUNTER — Ambulatory Visit: Payer: Medicare HMO | Admitting: Oncology

## 2016-11-05 ENCOUNTER — Telehealth: Payer: Self-pay | Admitting: Oncology

## 2016-11-05 NOTE — Telephone Encounter (Signed)
sw pt to confirm r/s appt to 6/28 at 0930 per GM out of office

## 2016-11-27 ENCOUNTER — Ambulatory Visit (HOSPITAL_BASED_OUTPATIENT_CLINIC_OR_DEPARTMENT_OTHER): Payer: Medicare HMO | Admitting: Oncology

## 2016-11-27 VITALS — BP 138/82 | HR 85 | Temp 98.1°F | Resp 18 | Ht 63.0 in | Wt 154.3 lb

## 2016-11-27 DIAGNOSIS — Z79811 Long term (current) use of aromatase inhibitors: Secondary | ICD-10-CM | POA: Diagnosis not present

## 2016-11-27 DIAGNOSIS — C50412 Malignant neoplasm of upper-outer quadrant of left female breast: Secondary | ICD-10-CM

## 2016-11-27 DIAGNOSIS — Z171 Estrogen receptor negative status [ER-]: Secondary | ICD-10-CM

## 2016-11-27 MED ORDER — TAMOXIFEN CITRATE 20 MG PO TABS: 20.0000 mg | ORAL_TABLET | Freq: Every day | ORAL | 12 refills | Status: DC

## 2016-11-27 NOTE — Progress Notes (Signed)
[place]  Telephone:(336) [phone] Fax:(336) [phone]     ID: ITHA KROEKER DOB: [birth year]  MR#: [id number]  OEU#:[id number]  Patient Care Team: [name], MD as PCP - General (Family Medicine) Reyan Helle, Virgie Dad, MD as Consulting Physician (Oncology) Gery Pray, MD as Consulting Physician (Radiation Oncology) Alphonsa Overall, MD as Consulting Physician (General Surgery) Teena Irani, MD as Consulting Physician (Gastroenterology) Chauncey Cruel, MD OTHER MD:  CHIEF COMPLAINT: HER-2 positive breast cancer  CURRENT TREATMENT: Tamoxifen   BREAST CANCER HISTORY: From the original intake note:   Tiffani had bilateral screening mammography at [facility] 03/28/2016. This found the breast density to be category A. In the left breast at the 12:00 position there was a new oval lesion. There were no other findings of concern. On 04/04/2016 the patient underwent left ultrasonography this showed a possible hypoechoic lesion in the upper outer quadrant of the left breast, measuring approximately 0.5 cm. The left axilla was mammographically benign.  Biopsy of the left breast area in question 04/09/2016 showed ([id number]) invasive ductal carcinoma, grade 1 or 2, estrogen and progesterone receptor negative, with an MIB-1 of 10%, but HER-2 amplified, the signals ratio being 7.36 and the number per cell 9.20.  Her subsequent history is as detailed below.  INTERVAL HISTORY: Maryetta returns today for follow-up and treatment of her estrogen receptor positive breast cancer. Since her last visit here she completed her radiation treatments. She did quite well with dose, was able to continue to work, but did feel more tired than usual, she says.--She is here today to discuss anti-estrogens.  REVIEW OF SYSTEMS: A detailed review of systems today was otherwise stable   PAST MEDICAL HISTORY: Past Medical History:  Diagnosis Date  . Allergic rhinitis   . Arthritis   . Family history  of breast cancer   . Family history of colon cancer   . Family history of pancreatic cancer   . GERD (gastroesophageal reflux disease)   . GI bleed   . History of basal cell cancer   . History of radiation therapy 08/05/2016 - 09/12/2016   Left Breast 50.4 Gy 28 fractions  . Hyperlipidemia   . Hypertension   . Hypothyroidism   . Lumbar disc disease   . Malignant neoplasm of upper-outer quadrant of left female breast ([place]) 04/11/2016  . Pacemaker   . Peptic ulcer disease   . SSS (sick sinus syndrome) ([place])   . Thyroid disease     PAST SURGICAL HISTORY: Past Surgical History:  Procedure Laterality Date  . ABDOMINAL HYSTERECTOMY    . BREAST LUMPECTOMY WITH RADIOACTIVE SEED AND SENTINEL LYMPH NODE BIOPSY Left 06/06/2016   Procedure: LEFT BREAST LUMPECTOMY WITH RADIOACTIVE SEED AND LEFT AXILLARY SENTINEL LYMPH NODE BIOPSY;  Surgeon: Alphonsa Overall, MD;  Location: [facility];  Service: General;  Laterality: Left;  . BREAST SURGERY     left  . EP IMPLANTABLE DEVICE N/A 05/15/2016   Procedure: PPM Generator Changeout;  Surgeon: Evans Lance, MD;  Location: [facility];  Service: Cardiovascular;  Laterality: N/A;  . HERNIA REPAIR    . PACEMAKER INSERTION      FAMILY HISTORY Family History  Problem Relation Age of Onset  . Congestive Heart Failure Mother   . Hypertension Father   . Bone cancer Father   . Breast cancer Sister 16  . Bradycardia Cousin   . Breast cancer Cousin 19  . Breast cancer Cousin 69  . Colon cancer Cousin 40  . Pancreatic  cancer Paternal Aunt 52  . Rectal cancer Paternal Aunt 3  . Heart attack Maternal Grandmother   . Heart attack Paternal Grandmother   . Stroke Paternal Grandfather   . Melanoma Daughter        dx in her 72s  The patient's father died at the age of 82 with myeloma. The patient's mother died at the age of 79 from heart disease the patient has a sister diagnosed with breast cancer at age 75, a paternal cousin diagnosed with breast cancer  at age 19, and a maternal cousin diagnosed with breast cancer. There is also a history of pancreatic, rectal, and: colon cancers all on the father's side of the family  GYNECOLOGIC HISTORY:  No LMP recorded. Patient has had a hysterectomy. Menarche age 24, first live birth age 48, the patient is [place] P2. She stopped having periods in 1981. She did not take hormone replacement. She used oral contraceptives remotely with no complications  SOCIAL HISTORY:  She has worked as a Pharmacist, hospital substituted, in Scientist, research (medical), and more recently as a Engineer, building services. Her husband [name] is under the care of hospice because of cirrhosis. The patient's daughter [name] lives in [place] where she works as a Pharmacist, hospital. Her daughter [name] lives in [place]. 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     ADVANCED DIRECTIVES: The patient's daughter, [name], is her healthcare power of attorney. She can be reached at [phone]   HEALTH MAINTENANCE: Social History  Substance Use Topics  . Smoking status: Never Smoker  . Smokeless tobacco: Never Used  . Alcohol use No     Colonoscopy: 2007/ [facility]  PAP:  Bone density: 03/28/2016 at [facility], T score of -1.3   Allergies  Allergen Reactions  . Nsaids Other (See Comments)    Upper GI bleed  . Penicillins Itching    Has patient had a PCN reaction causing immediate rash, facial/tongue/throat swelling, SOB or lightheadedness with hypotension:No Has patient had a PCN reaction causing severe rash involving mucus membranes or skin necrosis:No Has patient had a PCN reaction that required hospitalization:No Has patient had a PCN reaction occurring within the last 10 years:No If all of the above answers are "NO", then may proceed with Cephalosporin use.   . Bee Venom   . Sulfa Antibiotics     itching  . Wasp Venom     Current Outpatient Prescriptions  Medication Sig Dispense Refill  . albuterol (PROVENTIL HFA;VENTOLIN HFA) 108 (90 BASE)  MCG/ACT inhaler Inhale into the lungs every 6 (six) hours as needed for wheezing or shortness of breath.    Marland Kitchen albuterol (PROVENTIL) (2.5 MG/3ML) 0.083% nebulizer solution Take 2.5 mg by nebulization every 6 (six) hours as needed for wheezing or shortness of breath.    Marland Kitchen atorvastatin (LIPITOR) 80 MG tablet Take 80 mg by mouth every evening.     Marland Kitchen azelastine (ASTELIN) 137 MCG/SPRAY nasal spray Place 2 sprays into both nostrils 2 (two) times daily as needed for allergies. Use in each nostril as directed    . DIHYDROERGOTAMINE MESYLATE NA Inject 2 mLs into the muscle daily as needed (for migraine headaches).     . fenofibrate 160 MG tablet Take 160 mg by mouth every evening.     . Fish Oil OIL Take 1,000 mg by mouth daily.     Marland Kitchen levothyroxine (SYNTHROID, LEVOTHROID) 100 MCG tablet Take 100 mcg by mouth daily before breakfast.    . mometasone (NASONEX) 50 MCG/ACT nasal spray Place  2 sprays into the nose daily as needed (for nasal congestion.).     Marland Kitchen omeprazole (PRILOSEC) 20 MG capsule Take 20 mg by mouth daily.    . tamoxifen (NOLVADEX) 20 MG tablet Take 1 tablet (20 mg total) by mouth daily. 90 tablet 12  . triamterene-hydrochlorothiazide (MAXZIDE) 75-50 MG tablet Take 1 tablet by mouth every evening.     . Vitamin D, Cholecalciferol, 1000 UNITS TABS Take 1,000 Units by mouth daily.      No current facility-administered medications for this visit.     OBJECTIVE: Middle-aged white woman Who appears well  Vitals:   11/27/16 0933  BP: 138/82  Pulse: 85  Resp: 18  Temp: 98.1 F (36.7 C)     Body mass index is 27.33 kg/m.    ECOG FS:0 - Asymptomatic  Sclerae unicteric, pupils round and equal Oropharynx clear and moist No cervical or supraclavicular adenopathy Lungs no rales or rhonchi Heart regular rate and rhythm Abd soft, nontender, positive bowel sounds MSK no focal spinal tenderness, no upper extremity lymphedema Neuro: nonfocal, well oriented, appropriate affect Breasts: The right  breast is benign. The left breast is status post lumpectomy and radiation. There is minimal hyperpigmentation. There is slight skin coarsening. Overall the cosmetic result is excellent. There is no evidence of local recurrence. Both axillae are benign.  LAB RESULTS:  CMP     Component Value Date/Time   NA 138 05/30/2016 0840   NA 142 04/16/2016 0822   K 3.9 05/30/2016 0840   K 3.8 04/16/2016 0822   CL 106 05/30/2016 0840   CO2 25 05/30/2016 0840   CO2 23 04/16/2016 0822   GLUCOSE 84 05/30/2016 0840   GLUCOSE 102 04/16/2016 0822   BUN 21 (H) 05/30/2016 0840   BUN 25.3 04/16/2016 0822   CREATININE 0.75 05/30/2016 0840   CREATININE 0.97 (H) 05/01/2016 1627   CREATININE 0.8 04/16/2016 0822   CALCIUM 9.5 05/30/2016 0840   CALCIUM 9.7 04/16/2016 0822   PROT 6.9 04/16/2016 0822   ALBUMIN 3.8 04/16/2016 0822   AST 20 04/16/2016 0822   ALT 18 04/16/2016 0822   ALKPHOS 68 04/16/2016 0822   BILITOT 0.60 04/16/2016 0822   GFRNONAA >60 05/30/2016 0840   GFRAA >60 05/30/2016 0840    INo results found for: SPEP, UPEP  Lab Results  Component Value Date   WBC 5.2 05/30/2016   NEUTROABS 3,024 05/01/2016   HGB 13.6 05/30/2016   HCT 40.1 05/30/2016   MCV 86.1 05/30/2016   PLT 334 05/30/2016      Chemistry      Component Value Date/Time   NA 138 05/30/2016 0840   NA 142 04/16/2016 0822   K 3.9 05/30/2016 0840   K 3.8 04/16/2016 0822   CL 106 05/30/2016 0840   CO2 25 05/30/2016 0840   CO2 23 04/16/2016 0822   BUN 21 (H) 05/30/2016 0840   BUN 25.3 04/16/2016 0822   CREATININE 0.75 05/30/2016 0840   CREATININE 0.97 (H) 05/01/2016 1627   CREATININE 0.8 04/16/2016 0822      Component Value Date/Time   CALCIUM 9.5 05/30/2016 0840   CALCIUM 9.7 04/16/2016 0822   ALKPHOS 68 04/16/2016 0822   AST 20 04/16/2016 0822   ALT 18 04/16/2016 0822   BILITOT 0.60 04/16/2016 0822       No results found for: LABCA2  No components found for: LABCA125  No results for input(s): INR in  the last 168 hours.  Urinalysis No results found for:  COLORURINE, APPEARANCEUR, LABSPEC, PHURINE, GLUCOSEU, HGBUR, BILIRUBINUR, KETONESUR, PROTEINUR, UROBILINOGEN, NITRITE, LEUKOCYTESUR   STUDIES: Breast ultrasound 07/17/2016 on the left showed an 8.3 cm oval fluid collection, which appeared benign. Bone density 03/28/2016 at [facility] showed a T score of -1.3 at the spine.  ELIGIBLE FOR AVAILABLE RESEARCH PROTOCOL: no  ASSESSMENT: 71 y.o. [place] woman status post left breast upper outer quadrant biopsy 04/09/2016 for a clinical T1a N0, stage IA invasive ductal carcinoma, grade 1 or 2, estrogen and progesterone receptor negative, with an MIB-1 of 10%, but HER-2 amplified  (1) left lumpectomy and sentinel lymph node samplng 06/06/2016 showed a pT1a pN0, stage IA  Invasive ductal carcinoma , grade 2 , with negatve margins  (2) No consideration of anti-HER-2 immunotherapy/chemotherapy given final tumor size  (3) adjuvant radiation 08/05/16-09/12/16 Site/dose:   Left breast/ 50.4 Gy in 28 fractions  (a) the patient's pacemaker has been moved to the contralateral side to facilitate treatment   (4)  Genetics testing through the Comprehensive Cancer Panel offered by [facility]  found no deleterious mutations in APC, ATM, AXIN2, BARD1, BMPR1A, BRCA1, BRCA2, BRIP1, CDH1, CDK4, CDKN2A, CHEK2, EPCAM, FANCC, MLH1, MSH2, MSH6, MUTYH, NBN, PALB2, PMS2, POLD1, POLE, PTEN, RAD51C, RAD51D, SCG5/GREM1, SMAD4, STK11, TP53, VHL, and XRCC2.     (5) to start tamoxifen 12/13/2016  (a) bone density 08/07/2003 showed a T score of -1.0.  (b) bone density at [facility] 03/28/2016 showed a T score of -1.3.  PLAN: Tryniti has completed her local treatment and largely recovered from it. She is now ready to start anti-estrogens._0 @   has completed her local treatment and is now ready to start anti-estrogens.  We discussed the difference between tamoxifen and anastrozole in detail. She understands that  anastrozole and the aromatase inhibitors in general work by blocking estrogen production. Accordingly vaginal dryness, decrease in bone density, and of course hot flashes can result. The aromatase inhibitors can also negatively affect the cholesterol profile, although that is a minor effect. One out of 5 women on aromatase inhibitors we will feel "old and achy". This arthralgia/myalgia syndrome, which resembles fibromyalgia clinically, does resolve with stopping the medications. Accordingly this is not a reason to not try an aromatase inhibitor but it is a frequent reason to stop it (in other words 20% of women will not be able to tolerate these medications).  Tamoxifen on the other hand does not block estrogen production. It does not "take away a woman's estrogen". It blocks the estrogen receptor in breast cells. Like anastrozole, it can also cause hot flashes. As opposed to anastrozole, tamoxifen has many estrogen-like effects. It is technically an estrogen receptor modulator. This means that in some tissues tamoxifen works like estrogen-- for example it helps strengthen the bones. It tends to improve the cholesterol profile. It can cause thickening of the endometrial lining, and even endometrial polyps or rarely cancer of the uterus.(The risk of uterine cancer due to tamoxifen is one additional cancer per thousand women year). It can cause vaginal wetness or stickiness. It can cause blood clots through this estrogen-like effect--the risk of blood clots with tamoxifen is exactly the same as with birth control pills or hormone replacement.  Neither of these agents causes mood changes or weight gain, despite the popular belief that they can have these side effects. We have data from studies comparing either of these drugs with placebo, and in those cases the control group had the same amount of weight gain and depression as the group that took the drug.  After all this discussion and particularly taking into  account the fact that she took oral contraceptives for many years with no complications, we opted for tamoxifen. She will started now. I'm going to see her in about 2 months to make sure she is tolerating it well and if she is we will continue this for total of 5 years.  She knows to call for any other problems that may develop before her next visit here.   Chauncey Cruel, MD   11/27/2016 3:27 PM Medical Oncology and Hematology [facility] [address] [place], [place] [address] Tel. [phone]    Fax. [phone]

## 2016-12-12 ENCOUNTER — Encounter: Payer: Self-pay | Admitting: Oncology

## 2016-12-12 ENCOUNTER — Encounter: Payer: Self-pay | Admitting: Radiation Oncology

## 2016-12-12 NOTE — Progress Notes (Signed)
Patient walked in requesting itemized statements for Cancer policy for office visits and radiation and needed form complete. Printed statements for physician here and instructed to proceed to Radiation department for statements and form to be completed. Patient did not have chemo. She verbalized understanding and thanked me for my help.

## 2016-12-12 NOTE — Progress Notes (Signed)
Paperwork (insurance) received from [facility], given to nurse along with an itemized bill 12/12/16

## 2016-12-15 ENCOUNTER — Telehealth: Payer: Self-pay | Admitting: Internal Medicine

## 2016-12-15 NOTE — Telephone Encounter (Signed)
Walk In Pt Form-Physician Statement dropped off placed in Dr.Taylor doc box.

## 2016-12-16 ENCOUNTER — Telehealth: Payer: Self-pay

## 2016-12-16 NOTE — Telephone Encounter (Signed)
Call placed to Pt.  Requested call back to discuss documentation request.  This nurse name and # left for call back.

## 2016-12-17 NOTE — Telephone Encounter (Signed)
Call back received from Pt.  Per Pt her insurance is denying claims related to moving her pacemaker d/t need for radiation tx.  Advised to send paperwork to [facility] in billing.  Notified Pt.  Will cont to monitor.

## 2016-12-18 ENCOUNTER — Telehealth: Payer: Self-pay | Admitting: Radiation Oncology

## 2016-12-18 DIAGNOSIS — I10 Essential (primary) hypertension: Secondary | ICD-10-CM | POA: Diagnosis not present

## 2016-12-18 DIAGNOSIS — E782 Mixed hyperlipidemia: Secondary | ICD-10-CM | POA: Diagnosis not present

## 2016-12-18 DIAGNOSIS — L409 Psoriasis, unspecified: Secondary | ICD-10-CM | POA: Diagnosis not present

## 2016-12-18 DIAGNOSIS — Z853 Personal history of malignant neoplasm of breast: Secondary | ICD-10-CM | POA: Diagnosis not present

## 2016-12-18 DIAGNOSIS — Z950 Presence of cardiac pacemaker: Secondary | ICD-10-CM | POA: Diagnosis not present

## 2016-12-18 DIAGNOSIS — Z8719 Personal history of other diseases of the digestive system: Secondary | ICD-10-CM | POA: Diagnosis not present

## 2016-12-18 DIAGNOSIS — M8580 Other specified disorders of bone density and structure, unspecified site: Secondary | ICD-10-CM | POA: Diagnosis not present

## 2016-12-18 DIAGNOSIS — E039 Hypothyroidism, unspecified: Secondary | ICD-10-CM | POA: Diagnosis not present

## 2016-12-18 DIAGNOSIS — Z0000 Encounter for general adult medical examination without abnormal findings: Secondary | ICD-10-CM | POA: Diagnosis not present

## 2016-12-18 DIAGNOSIS — E559 Vitamin D deficiency, unspecified: Secondary | ICD-10-CM | POA: Diagnosis not present

## 2016-12-18 NOTE — Telephone Encounter (Signed)
LVM for patient to return call in reference to paperwork (physician statement and itemized bill for Rad Onc) 12/18/16

## 2016-12-19 DIAGNOSIS — Z950 Presence of cardiac pacemaker: Secondary | ICD-10-CM | POA: Diagnosis not present

## 2016-12-19 DIAGNOSIS — I10 Essential (primary) hypertension: Secondary | ICD-10-CM | POA: Diagnosis not present

## 2016-12-19 DIAGNOSIS — E039 Hypothyroidism, unspecified: Secondary | ICD-10-CM | POA: Diagnosis not present

## 2016-12-19 DIAGNOSIS — E559 Vitamin D deficiency, unspecified: Secondary | ICD-10-CM | POA: Diagnosis not present

## 2016-12-19 DIAGNOSIS — Z0000 Encounter for general adult medical examination without abnormal findings: Secondary | ICD-10-CM | POA: Diagnosis not present

## 2016-12-19 DIAGNOSIS — Z853 Personal history of malignant neoplasm of breast: Secondary | ICD-10-CM | POA: Diagnosis not present

## 2016-12-19 DIAGNOSIS — M8580 Other specified disorders of bone density and structure, unspecified site: Secondary | ICD-10-CM | POA: Diagnosis not present

## 2016-12-19 DIAGNOSIS — R8290 Unspecified abnormal findings in urine: Secondary | ICD-10-CM | POA: Diagnosis not present

## 2016-12-19 DIAGNOSIS — Z8719 Personal history of other diseases of the digestive system: Secondary | ICD-10-CM | POA: Diagnosis not present

## 2016-12-19 DIAGNOSIS — L409 Psoriasis, unspecified: Secondary | ICD-10-CM | POA: Diagnosis not present

## 2016-12-22 ENCOUNTER — Encounter: Payer: Self-pay | Admitting: Radiation Oncology

## 2016-12-22 NOTE — Progress Notes (Signed)
Paperwork completed, patient advised nothing else is needed just the physician statement and the itemized bill. Patient came and retrieved paperwork 12/22/16

## 2016-12-23 ENCOUNTER — Encounter: Payer: Self-pay | Admitting: Internal Medicine

## 2016-12-23 NOTE — Telephone Encounter (Signed)
Request for documentation completed.  Will leave documentation at front desk for pick up.  Pt notified documentation is ready.  Left this nurse name and # for any further questions.

## 2017-01-20 ENCOUNTER — Encounter: Payer: Medicare HMO | Admitting: Adult Health

## 2017-01-22 NOTE — Addendum Note (Signed)
Addendum  created 01/22/17 0916 by Roberts Gaudy, MD   Sign clinical note

## 2017-01-27 ENCOUNTER — Ambulatory Visit (HOSPITAL_BASED_OUTPATIENT_CLINIC_OR_DEPARTMENT_OTHER): Payer: Medicare HMO | Admitting: Oncology

## 2017-01-27 VITALS — BP 144/72 | HR 74 | Temp 98.3°F | Resp 18 | Ht 63.0 in | Wt 154.1 lb

## 2017-01-27 DIAGNOSIS — Z79811 Long term (current) use of aromatase inhibitors: Secondary | ICD-10-CM

## 2017-01-27 DIAGNOSIS — Z171 Estrogen receptor negative status [ER-]: Secondary | ICD-10-CM

## 2017-01-27 DIAGNOSIS — C50412 Malignant neoplasm of upper-outer quadrant of left female breast: Secondary | ICD-10-CM

## 2017-01-27 NOTE — Progress Notes (Signed)
[place]  [phone] [phone]     ID: LUNELL ROBART DOB: 07/20/1945  MR#: [id number]  CBJ#:[id number]  Patient Care Team: [name], MD as PCP - General (Family Medicine) Magrinat, Virgie Dad, MD as Consulting Physician (Oncology) Gery Pray, MD as Consulting Physician (Radiation Oncology) Alphonsa Overall, MD as Consulting Physician (General Surgery) Teena Irani, MD as Consulting Physician (Gastroenterology) Chauncey Cruel, MD OTHER MD:  CHIEF COMPLAINT: HER-2 positive breast cancer  CURRENT TREATMENT: Tamoxifen   BREAST CANCER HISTORY: From the original intake note:   Janazia had bilateral screening mammography at [facility] 03/28/2016. This found the breast density to be category A. In the left breast at the 12:00 position there was a new oval lesion. There were no other findings of concern. On 04/04/2016 the patient underwent left ultrasonography this showed a possible hypoechoic lesion in the upper outer quadrant of the left breast, measuring approximately 0.5 cm. The left axilla was mammographically benign.  Biopsy of the left breast area in question 04/09/2016 showed ([id number]) invasive ductal carcinoma, grade 1 or 2, estrogen and progesterone receptor negative, with an MIB-1 of 10%, but HER-2 amplified, the signals ratio being 7.36 and the number per cell 9.20.  Her subsequent history is as detailed below.  INTERVAL HISTORY: Magdeline returns today for follow-up and treatment of her estrogen receptor positive breast cancer. She continues on tamoxifen, with good tolerance. She does have some hot flashes, or 5 a day, which occasionally can wake her up. She has a mild vaginal discharge. This is decreasing. Otherwise she reports no problems from this medication which is very inexpensive for her.  REVIEW OF SYSTEMS: Detailed review of systems today was otherwise benign.  PAST MEDICAL HISTORY: Past Medical History:  Diagnosis Date  .  Allergic rhinitis   . Arthritis   . Family history of breast cancer   . Family history of colon cancer   . Family history of pancreatic cancer   . GERD (gastroesophageal reflux disease)   . GI bleed   . History of basal cell cancer   . History of radiation therapy 08/05/2016 - 09/12/2016   Left Breast 50.4 Gy 28 fractions  . Hyperlipidemia   . Hypertension   . Hypothyroidism   . Lumbar disc disease   . Malignant neoplasm of upper-outer quadrant of left female breast ([place]) 04/11/2016  . Pacemaker   . Peptic ulcer disease   . SSS (sick sinus syndrome) ([place])   . Thyroid disease     PAST SURGICAL HISTORY: Past Surgical History:  Procedure Laterality Date  . ABDOMINAL HYSTERECTOMY    . BREAST LUMPECTOMY WITH RADIOACTIVE SEED AND SENTINEL LYMPH NODE BIOPSY Left 06/06/2016   Procedure: LEFT BREAST LUMPECTOMY WITH RADIOACTIVE SEED AND LEFT AXILLARY SENTINEL LYMPH NODE BIOPSY;  Surgeon: Alphonsa Overall, MD;  Location: [facility];  Service: General;  Laterality: Left;  . BREAST SURGERY     left  . EP IMPLANTABLE DEVICE N/A 05/15/2016   Procedure: PPM Generator Changeout;  Surgeon: Evans Lance, MD;  Location: [facility];  Service: Cardiovascular;  Laterality: N/A;  . HERNIA REPAIR    . PACEMAKER INSERTION      FAMILY HISTORY Family History  Problem Relation Age of Onset  . Congestive Heart Failure Mother   . Hypertension Father   . Bone cancer Father   . Breast cancer Sister 21  . Bradycardia Cousin   . Breast cancer Cousin 57  . Breast cancer Cousin 33  .  Colon cancer Cousin 38  . Pancreatic cancer Paternal Aunt 76  . Rectal cancer Paternal Aunt 9  . Heart attack Maternal Grandmother   . Heart attack Paternal Grandmother   . Stroke Paternal Grandfather   . Melanoma Daughter        dx in her 90s  The patient's father died at the age of 73 with myeloma. The patient's mother died at the age of 25 from heart disease the patient has a sister diagnosed with breast cancer at age  80, a paternal cousin diagnosed with breast cancer at age 37, and a maternal cousin diagnosed with breast cancer. There is also a history of pancreatic, rectal, and: colon cancers all on the father's side of the family  GYNECOLOGIC HISTORY:  No LMP recorded. Patient has had a hysterectomy. Menarche age 18, first live birth age 24, the patient is [place] P2. She stopped having periods in 1981. She did not take hormone replacement. She used oral contraceptives remotely with no complications  SOCIAL HISTORY:  She has worked as a Pharmacist, hospital substituted, in Scientist, research (medical), and more recently as a Engineer, building services. Her husband [name] is under the care of hospice because of cirrhosis. The patient's daughter [name] lives in [place] where she works as a Pharmacist, hospital. Her daughter [name] lives in [place]. 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     ADVANCED DIRECTIVES: The patient's daughter, [name], is her healthcare power of attorney. She can be reached at [phone]   HEALTH MAINTENANCE: Social History  Substance Use Topics  . Smoking status: Never Smoker  . Smokeless tobacco: Never Used  . Alcohol use No     Colonoscopy: 2007/ [facility]  PAP:  Bone density: 03/28/2016 at [facility], T score of -1.3   Allergies  Allergen Reactions  . Nsaids Other (See Comments)    Upper GI bleed  . Penicillins Itching    Has patient had a PCN reaction causing immediate rash, facial/tongue/throat swelling, SOB or lightheadedness with hypotension:No Has patient had a PCN reaction causing severe rash involving mucus membranes or skin necrosis:No Has patient had a PCN reaction that required hospitalization:No Has patient had a PCN reaction occurring within the last 10 years:No If all of the above answers are "NO", then may proceed with Cephalosporin use.   . Bee Venom   . Sulfa Antibiotics     itching  . Wasp Venom     Current Outpatient Prescriptions  Medication Sig Dispense Refill  .  albuterol (PROVENTIL HFA;VENTOLIN HFA) 108 (90 BASE) MCG/ACT inhaler Inhale into the lungs every 6 (six) hours as needed for wheezing or shortness of breath.    Marland Kitchen albuterol (PROVENTIL) (2.5 MG/3ML) 0.083% nebulizer solution Take 2.5 mg by nebulization every 6 (six) hours as needed for wheezing or shortness of breath.    Marland Kitchen atorvastatin (LIPITOR) 80 MG tablet Take 80 mg by mouth every evening.     Marland Kitchen azelastine (ASTELIN) 137 MCG/SPRAY nasal spray Place 2 sprays into both nostrils 2 (two) times daily as needed for allergies. Use in each nostril as directed    . DIHYDROERGOTAMINE MESYLATE NA Inject 2 mLs into the muscle daily as needed (for migraine headaches).     . fenofibrate 160 MG tablet Take 160 mg by mouth every evening.     . Fish Oil OIL Take 1,000 mg by mouth daily.     Marland Kitchen levothyroxine (SYNTHROID, LEVOTHROID) 100 MCG tablet Take 100 mcg by mouth daily before breakfast.    .  mometasone (NASONEX) 50 MCG/ACT nasal spray Place 2 sprays into the nose daily as needed (for nasal congestion.).     Marland Kitchen omeprazole (PRILOSEC) 20 MG capsule Take 20 mg by mouth daily.    Marland Kitchen triamterene-hydrochlorothiazide (MAXZIDE) 75-50 MG tablet Take 1 tablet by mouth every evening.     . Vitamin D, Cholecalciferol, 1000 UNITS TABS Take 1,000 Units by mouth daily.      No current facility-administered medications for this visit.     OBJECTIVE: Middle-aged white woman In no acute distress  Vitals:   01/27/17 1512  BP: (!) 144/72  Pulse: 74  Resp: 18  Temp: 98.3 F (36.8 C)  SpO2: 100%     Body mass index is 27.3 kg/m.    ECOG FS:0 - Asymptomatic  Sclerae unicteric, EOMs intact Oropharynx clear and moist No cervical or supraclavicular adenopathy Lungs no rales or rhonchi Heart regular rate and rhythm Abd soft, nontender, positive bowel sounds MSK no focal spinal tenderness, no upper extremity lymphedema Neuro: nonfocal, well oriented, appropriate affect Breasts: The right breast is unremarkable. The left  breast is undergone lumpectomy followed by radiation with no evidence of local recurrence. Both axillae are benign.  LAB RESULTS:  CMP     Component Value Date/Time   NA 138 05/30/2016 0840   NA 142 04/16/2016 0822   K 3.9 05/30/2016 0840   K 3.8 04/16/2016 0822   CL 106 05/30/2016 0840   CO2 25 05/30/2016 0840   CO2 23 04/16/2016 0822   GLUCOSE 84 05/30/2016 0840   GLUCOSE 102 04/16/2016 0822   BUN 21 (H) 05/30/2016 0840   BUN 25.3 04/16/2016 0822   CREATININE 0.75 05/30/2016 0840   CREATININE 0.97 (H) 05/01/2016 1627   CREATININE 0.8 04/16/2016 0822   CALCIUM 9.5 05/30/2016 0840   CALCIUM 9.7 04/16/2016 0822   PROT 6.9 04/16/2016 0822   ALBUMIN 3.8 04/16/2016 0822   AST 20 04/16/2016 0822   ALT 18 04/16/2016 0822   ALKPHOS 68 04/16/2016 0822   BILITOT 0.60 04/16/2016 0822   GFRNONAA >60 05/30/2016 0840   GFRAA >60 05/30/2016 0840    INo results found for: SPEP, UPEP  Lab Results  Component Value Date   WBC 5.2 05/30/2016   NEUTROABS 3,024 05/01/2016   HGB 13.6 05/30/2016   HCT 40.1 05/30/2016   MCV 86.1 05/30/2016   PLT 334 05/30/2016      Chemistry      Component Value Date/Time   NA 138 05/30/2016 0840   NA 142 04/16/2016 0822   K 3.9 05/30/2016 0840   K 3.8 04/16/2016 0822   CL 106 05/30/2016 0840   CO2 25 05/30/2016 0840   CO2 23 04/16/2016 0822   BUN 21 (H) 05/30/2016 0840   BUN 25.3 04/16/2016 0822   CREATININE 0.75 05/30/2016 0840   CREATININE 0.97 (H) 05/01/2016 1627   CREATININE 0.8 04/16/2016 0822      Component Value Date/Time   CALCIUM 9.5 05/30/2016 0840   CALCIUM 9.7 04/16/2016 0822   ALKPHOS 68 04/16/2016 0822   AST 20 04/16/2016 0822   ALT 18 04/16/2016 0822   BILITOT 0.60 04/16/2016 0822       No results found for: LABCA2  No components found for: LABCA125  No results for input(s): INR in the last 168 hours.  Urinalysis No results found for: COLORURINE, APPEARANCEUR, LABSPEC, PHURINE, GLUCOSEU, HGBUR, BILIRUBINUR,  KETONESUR, PROTEINUR, UROBILINOGEN, NITRITE, LEUKOCYTESUR   STUDIES: DEXA scan results reviewed  ELIGIBLE FOR AVAILABLE RESEARCH PROTOCOL:  no  ASSESSMENT: 71 y.o. [place] woman status post left breast upper outer quadrant biopsy 04/09/2016 for a clinical T1a N0, stage IA invasive ductal carcinoma, grade 1 or 2, estrogen and progesterone receptor negative, with an MIB-1 of 10%, but HER-2 amplified  (1) left lumpectomy and sentinel lymph node samplng 06/06/2016 showed a pT1a pN0, stage IA  Invasive ductal carcinoma , grade 2 , with negatve margins  (2) No consideration of anti-HER-2 immunotherapy/chemotherapy given final tumor size  (3) adjuvant radiation 08/05/16-09/12/16 Site/dose:   Left breast/ 50.4 Gy in 28 fractions  (a) the patient's pacemaker has been moved to the contralateral side to facilitate treatment   (4)  Genetics testing through the Comprehensive Cancer Panel offered by [facility]  found no deleterious mutations in APC, ATM, AXIN2, BARD1, BMPR1A, BRCA1, BRCA2, BRIP1, CDH1, CDK4, CDKN2A, CHEK2, EPCAM, FANCC, MLH1, MSH2, MSH6, MUTYH, NBN, PALB2, PMS2, POLD1, POLE, PTEN, RAD51C, RAD51D, SCG5/GREM1, SMAD4, STK11, TP53, VHL, and XRCC2.     (5) started tamoxifen 12/13/2016  (a) bone density 08/07/2003 showed a T score of -1.0.  (b) bone density at [facility] 03/28/2016 showed a T score of -1.3.  PLAN: Lorriane is tolerating tamoxifen generally well and the plan will be to continue that for a total of 5 years.  She sees her primary care physician Dr. Rex Kras in August and she will be seeing her surgeon Dr. Lucia Gaskins in late October. Accordingly I am going to see her in April and very likely we will start yearly visits at that point.  She will be due for repeat mammography late September or early October. Those results can be reviewed with her by Dr. Lucia Gaskins at her visit with him this October.  Otherwise I'm delighted that Shayli is doing so well. She certainly has a very good prognosis.  She knows to call for any problems that may develop before her next visit here.    Chauncey Cruel, MD   01/27/2017 3:18 PM Medical Oncology and Hematology [facility] [address] [place], [place] [address] Tel. [phone]    Fax. [phone]

## 2017-01-30 ENCOUNTER — Other Ambulatory Visit: Payer: Self-pay | Admitting: *Deleted

## 2017-01-30 ENCOUNTER — Telehealth: Payer: Self-pay

## 2017-01-30 MED ORDER — GABAPENTIN 300 MG PO CAPS: 300.0000 mg | ORAL_CAPSULE | Freq: Every day | ORAL | 3 refills | Status: DC

## 2017-01-30 MED ORDER — VENLAFAXINE HCL ER 37.5 MG PO CP24: 37.5000 mg | ORAL_CAPSULE | Freq: Every day | ORAL | 3 refills | Status: DC

## 2017-01-30 NOTE — Telephone Encounter (Signed)
Pt called that Dr Jana Hakim was going to rx something with mg of 37.2 for hot flashes and possibly gabapentin ? for neuropathy. To [facility] on [address]. No mention in OV note 01/27/17.

## 2017-02-25 ENCOUNTER — Telehealth: Payer: Self-pay

## 2017-02-25 NOTE — Telephone Encounter (Signed)
Called patient to confirm SCP visit on 02/27/17.  Patient states she will be here.

## 2017-02-27 ENCOUNTER — Ambulatory Visit (HOSPITAL_BASED_OUTPATIENT_CLINIC_OR_DEPARTMENT_OTHER): Payer: Medicare HMO | Admitting: Adult Health

## 2017-02-27 ENCOUNTER — Telehealth: Payer: Self-pay

## 2017-02-27 ENCOUNTER — Encounter: Payer: Self-pay | Admitting: Adult Health

## 2017-02-27 ENCOUNTER — Ambulatory Visit (HOSPITAL_COMMUNITY)
Admission: RE | Admit: 2017-02-27 | Discharge: 2017-02-27 | Disposition: A | Discharge status: Home / Self Care | Payer: Medicare HMO | Source: Ambulatory Visit | Attending: Adult Health | Admitting: Adult Health

## 2017-02-27 VITALS — BP 140/83 | HR 84 | Temp 97.7°F | Resp 20 | Ht 63.0 in | Wt 155.4 lb

## 2017-02-27 DIAGNOSIS — C50412 Malignant neoplasm of upper-outer quadrant of left female breast: Secondary | ICD-10-CM | POA: Diagnosis not present

## 2017-02-27 DIAGNOSIS — M47812 Spondylosis without myelopathy or radiculopathy, cervical region: Secondary | ICD-10-CM | POA: Diagnosis not present

## 2017-02-27 DIAGNOSIS — Z171 Estrogen receptor negative status [ER-]: Secondary | ICD-10-CM

## 2017-02-27 DIAGNOSIS — Z79811 Long term (current) use of aromatase inhibitors: Secondary | ICD-10-CM | POA: Diagnosis not present

## 2017-02-27 DIAGNOSIS — I6523 Occlusion and stenosis of bilateral carotid arteries: Secondary | ICD-10-CM | POA: Insufficient documentation

## 2017-02-27 DIAGNOSIS — M19012 Primary osteoarthritis, left shoulder: Secondary | ICD-10-CM | POA: Diagnosis not present

## 2017-02-27 DIAGNOSIS — N951 Menopausal and female climacteric states: Secondary | ICD-10-CM

## 2017-02-27 NOTE — Progress Notes (Signed)
CLINIC:  Survivorship   REASON FOR VISIT:  Routine follow-up post-treatment for a recent history of breast cancer.  BRIEF ONCOLOGIC HISTORY:    Malignant neoplasm of upper-outer quadrant of left breast in female, estrogen receptor negative ([place])   04/09/2016 Initial Biopsy    Left breast upper outer quadrant biopsy: IDC, grade 1-2, ER-, PR-, Ki-67 10%, HER-2 positive (ratio 7.36).       04/11/2016 Initial Diagnosis    Malignant neoplasm of upper-outer quadrant of left female breast ([place])     04/29/2016 Genetic Testing    Patient has genetic testing done for personal and family history of breast cancer. Results revealed patient has the following mutation(s): No deleterious mutations found on the comprehensive cancer panel through [facility].  Report date is 04/29/2016      06/06/2016 Surgery    Left lumpectomy and SLNB: IDC, 0.3 cm, grade 2, margins negative, 2 SLN negative, T1a, N0      08/05/2016 - 09/12/2016 Radiation Therapy    Adjuvant Radiation ([place]):    Left breast/ 50.4 Gy in 28 fractions      11/2016 -  Anti-estrogen oral therapy    Tamoxifen daily       INTERVAL HISTORY:  Ms. Kohrs presents to the [facility] today for our initial meeting to review her survivorship care plan detailing her treatment course for breast cancer, as well as monitoring long-term side effects of that treatment, education regarding health maintenance, screening, and overall wellness and health promotion.     Overall, Ms. Langelier reports feeling quite well.  She has been experiencing neck and shoulder pain, worse at night for several months.  This isn't getting better and is she concerned about it.  She is taking Tamoxifen and is having difficult to manage hot flashes.  She is taking Gabapentin for this in addition to Effexor.      REVIEW OF SYSTEMS:  Review of Systems  Constitutional: Negative for appetite change, chills, fatigue, fever and unexpected weight change.  HENT:   Negative for  hearing loss and lump/mass.   Eyes: Negative for eye problems and icterus.  Respiratory: Negative for chest tightness, cough and shortness of breath.   Cardiovascular: Negative for chest pain, leg swelling and palpitations.  Gastrointestinal: Negative for abdominal distention, abdominal pain, constipation, diarrhea, nausea and vomiting.  Endocrine: Positive for hot flashes.  Genitourinary: Negative for difficulty urinating.   Musculoskeletal: Positive for back pain. Negative for arthralgias.  Skin: Negative for itching and rash.  Neurological: Negative for dizziness, extremity weakness, headaches and numbness.  Hematological: Negative for adenopathy. Does not bruise/bleed easily.  Psychiatric/Behavioral: Negative for depression. The patient is not nervous/anxious.    Breast: Denies any new nodularity, masses, tenderness, nipple changes, or nipple discharge.      ONCOLOGY TREATMENT TEAM:  1. Surgeon:  Dr. Lucia Gaskins at [facility] 2. Medical Oncologist: Dr. Jana Hakim  3. Radiation Oncologist: Dr. Sondra Come    PAST MEDICAL/SURGICAL HISTORY:  Past Medical History:  Diagnosis Date  . Allergic rhinitis   . Arthritis   . Family history of breast cancer   . Family history of colon cancer   . Family history of pancreatic cancer   . GERD (gastroesophageal reflux disease)   . GI bleed   . History of basal cell cancer   . History of radiation therapy 08/05/2016 - 09/12/2016   Left Breast 50.4 Gy 28 fractions  . Hyperlipidemia   . Hypertension   . Hypothyroidism   . Lumbar disc disease   .  Malignant neoplasm of upper-outer quadrant of left female breast ([place]) 04/11/2016  . Pacemaker   . Peptic ulcer disease   . SSS (sick sinus syndrome) ([place])   . Thyroid disease    Past Surgical History:  Procedure Laterality Date  . ABDOMINAL HYSTERECTOMY    . BREAST LUMPECTOMY WITH RADIOACTIVE SEED AND SENTINEL LYMPH NODE BIOPSY Left 06/06/2016   Procedure: LEFT BREAST LUMPECTOMY WITH  RADIOACTIVE SEED AND LEFT AXILLARY SENTINEL LYMPH NODE BIOPSY;  Surgeon: Alphonsa Overall, MD;  Location: [facility];  Service: General;  Laterality: Left;  . BREAST SURGERY     left  . EP IMPLANTABLE DEVICE N/A 05/15/2016   Procedure: PPM Generator Changeout;  Surgeon: Evans Lance, MD;  Location: [place] [facility];  Service: Cardiovascular;  Laterality: N/A;  . HERNIA REPAIR    . PACEMAKER INSERTION       ALLERGIES:  Allergies  Allergen Reactions  . Nsaids Other (See Comments)    Upper GI bleed  . Penicillins Itching    Has patient had a PCN reaction causing immediate rash, facial/tongue/throat swelling, SOB or lightheadedness with hypotension:No Has patient had a PCN reaction causing severe rash involving mucus membranes or skin necrosis:No Has patient had a PCN reaction that required hospitalization:No Has patient had a PCN reaction occurring within the last 10 years:No If all of the above answers are "NO", then may proceed with Cephalosporin use.   . Bee Venom   . Sulfa Antibiotics     itching  . Wasp Venom      CURRENT MEDICATIONS:  Outpatient Encounter Prescriptions as of 02/27/2017  Medication Sig  . albuterol (PROVENTIL HFA;VENTOLIN HFA) 108 (90 BASE) MCG/ACT inhaler Inhale into the lungs every 6 (six) hours as needed for wheezing or shortness of breath.  Marland Kitchen albuterol (PROVENTIL) (2.5 MG/3ML) 0.083% nebulizer solution Take 2.5 mg by nebulization every 6 (six) hours as needed for wheezing or shortness of breath.  Marland Kitchen atorvastatin (LIPITOR) 80 MG tablet Take 80 mg by mouth every evening.   Marland Kitchen azelastine (ASTELIN) 137 MCG/SPRAY nasal spray Place 2 sprays into both nostrils 2 (two) times daily as needed for allergies. Use in each nostril as directed  . DIHYDROERGOTAMINE MESYLATE NA Inject 2 mLs into the muscle daily as needed (for migraine headaches).   . fenofibrate 160 MG tablet Take 160 mg by mouth every evening.   . Fish Oil OIL Take 1,000 mg by mouth daily.   Marland Kitchen gabapentin  (NEURONTIN) 300 MG capsule Take 1 capsule (300 mg total) by mouth at bedtime.  Marland Kitchen levothyroxine (SYNTHROID, LEVOTHROID) 100 MCG tablet Take 100 mcg by mouth daily before breakfast.  . mometasone (NASONEX) 50 MCG/ACT nasal spray Place 2 sprays into the nose daily as needed (for nasal congestion.).   Marland Kitchen omeprazole (PRILOSEC) 20 MG capsule Take 20 mg by mouth daily.  Marland Kitchen triamterene-hydrochlorothiazide (MAXZIDE) 75-50 MG tablet Take 1 tablet by mouth every evening.   . venlafaxine XR (EFFEXOR-XR) 37.5 MG 24 hr capsule Take 1 capsule (37.5 mg total) by mouth daily with breakfast.  . Vitamin D, Cholecalciferol, 1000 UNITS TABS Take 1,000 Units by mouth daily.    No facility-administered encounter medications on file as of 02/27/2017.      ONCOLOGIC FAMILY HISTORY:  Family History  Problem Relation Age of Onset  . Congestive Heart Failure Mother   . Hypertension Father   . Bone cancer Father   . Breast cancer Sister 53  . Bradycardia Cousin   . Breast cancer Cousin 28  .  Breast cancer Cousin 71  . Colon cancer Cousin 29  . Pancreatic cancer Paternal Aunt 76  . Rectal cancer Paternal Aunt 8  . Heart attack Maternal Grandmother   . Heart attack Paternal Grandmother   . Stroke Paternal Grandfather   . Melanoma Daughter        dx in her 59s     GENETIC COUNSELING/TESTING: See above  SOCIAL HISTORY:  NYASHA RAHILLY is married and lives with her husband in [place], [place].  She has 5 children and 13 grandchildren, 4 great grandchildren.  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  She denies any current or history of tobacco, alcohol, or illicit drug use.     PHYSICAL EXAMINATION:  Vital Signs:   Vitals:   02/27/17 1258  BP: 140/83  Pulse: 84  Resp: 20  Temp: 97.7 F (36.5 C)  SpO2: 98%   Filed Weights   02/27/17 1258  Weight: 155 lb 6.4 oz (70.5 kg)   General: Well-nourished, well-appearing female in no acute distress.  She is unaccompanied today.   HEENT: Head  is normocephalic.  Pupils equal and reactive to light. Conjunctivae clear without exudate.  Sclerae anicteric. Oral mucosa is pink, moist.  Oropharynx is pink without lesions or erythema.  Lymph: No cervical, supraclavicular, or infraclavicular lymphadenopathy noted on palpation.  Cardiovascular: Regular rate and rhythm.Marland Kitchen Respiratory: Clear to auscultation bilaterally. Chest expansion symmetric; breathing non-labored.  GI: Abdomen soft and round; non-tender, non-distended. Bowel sounds normoactive.  GU: Deferred.  Neuro: No focal deficits. Steady gait.  Psych: Mood and affect normal and appropriate for situation.  Extremities: No edema. MSK: No focal spinal tenderness to palpation.  Full range of motion in bilateral upper extremities Skin: Warm and dry.  LABORATORY DATA:  None for this visit.  DIAGNOSTIC IMAGING:  None for this visit.      ASSESSMENT AND PLAN:  Ms.. Dsouza is a pleasant 71 y.o. female with Stage IA left breast invasive ductal carcinoma, ER-/PR-/HER2+, diagnosed in 04/2016, treated with lumpectomy, and adjuvant radiation therapy.  She presents to the Survivorship Clinic for our initial meeting and routine follow-up post-completion of treatment for breast cancer.    1. Stage IA left breast cancer:  Ms. Molino is continuing to recover from definitive treatment for breast cancer. She will follow-up with her medical oncologist, Dr. Jana Hakim in 08/2017 with history and physical exam per surveillance protocol.  I reviewed with her that it seems that she was put on Tamoxifen by mistake.  She has ER/PR negative breast cancer.  I informed the patient of the mistake, and that she could stop Tamoxifen.  She is happy, because of the hot flashes she had due to the Tamoxifen and that this will mean that she doesn't have to take both Effexor (which she hasn't started) or Gabapentin to try to offset these.  I offered for patient to see Dr. Lindi Adie today for any questions (asw Dr. Jana Hakim is out of  the office), but she declined.  Today, a comprehensive survivorship care plan and treatment summary was reviewed with the patient today detailing her breast cancer diagnosis, treatment course, potential late/long-term effects of treatment, appropriate follow-up care with recommendations for the future, and patient education resources.  A copy of this summary, along with a letter will be sent to the patient's primary care provider via mail/fax/In Basket message after today's visit.    2. Neck and Shoulder pain: Likely musculoskeletal in nature.  I recommended yoga and Tai Chi.  We  will get imaging today with plain films just to be sure.    3. Bone health:  Given Ms. Carnell's age/history of breast cancer, she is at risk for bone demineralization.  Her last DEXA scan was 6 months ago and was normal.  I will defer to her PCP regarding future bone density testing and management of results.  In the meantime, she was encouraged to increase her consumption of foods rich in calcium, as well as increase her weight-bearing activities.  She was given education on specific activities to promote bone health.  4. Cancer screening:  Due to Ms. Billing's history and her age, she should receive screening for skin cancers, colon cancer, and gynecologic cancers.  The information and recommendations are listed on the patient's comprehensive care plan/treatment summary and were reviewed in detail with the patient.    5. Health maintenance and wellness promotion: Ms. Pecor was encouraged to consume 5-7 servings of fruits and vegetables per day. We reviewed the "Nutrition Rainbow" handout, as well as the handout "Take Control of Your Health and Reduce Your Cancer Risk" from the [employer].  She was also encouraged to engage in moderate to vigorous exercise for 30 minutes per day most days of the week. We discussed the [employer] fitness program, which is designed for cancer survivors to help them become more physically  fit after cancer treatments.  She was instructed to limit her alcohol consumption and continue to abstain from tobacco use.     6. Support services/counseling: It is not uncommon for this period of the patient's cancer care trajectory to be one of many emotions and stressors.  We discussed an opportunity for her to participate in the next session of [facility] ("Finding Your New Normal") support group series designed for patients after they have completed treatment.   Ms. Heather was encouraged to take advantage of our many other support services programs, support groups, and/or counseling in coping with her new life as a cancer survivor after completing anti-cancer treatment.  She was offered support today through active listening and expressive supportive counseling.  She was given information regarding our available services and encouraged to contact me with any questions or for help enrolling in any of our support group/programs.    Dispo:   -Return to cancer center for follow up with Dr. Jana Hakim in 08/2017 -Mammogram due in 03/2017 -Follow up with Dr. Lucia Gaskins at [facility] in 04/2018 -She is welcome to return back to the Survivorship Clinic at any time; no additional follow-up needed at this time.  -Consider referral back to survivorship as a long-term survivor for continued surveillance  A total of (50) minutes of face-to-face time was spent with this patient with greater than 50% of that time in counseling and care-coordination.   Gardenia Phlegm, [place] [phone]   Note: PRIMARY CARE PROVIDER [name], [place] [phone]

## 2017-02-27 NOTE — Telephone Encounter (Signed)
Called patient with xray results that show degeneration in the neck and shoulder.  Encouraged patient to see PCP if pain continues or worsens per NP.  Patient voiced understanding.

## 2017-03-17 DIAGNOSIS — Z853 Personal history of malignant neoplasm of breast: Secondary | ICD-10-CM | POA: Diagnosis not present

## 2017-03-17 DIAGNOSIS — R928 Other abnormal and inconclusive findings on diagnostic imaging of breast: Secondary | ICD-10-CM | POA: Diagnosis not present

## 2017-03-19 ENCOUNTER — Ambulatory Visit (INDEPENDENT_AMBULATORY_CARE_PROVIDER_SITE_OTHER): Payer: Medicare HMO | Admitting: *Deleted

## 2017-03-19 DIAGNOSIS — I495 Sick sinus syndrome: Secondary | ICD-10-CM

## 2017-03-20 LAB — CUP PACEART INCLINIC DEVICE CHECK
Battery Impedance: 111 Ohm
Brady Statistic AP VP Percent: 1 %
Brady Statistic AP VS Percent: 88 %
Brady Statistic AS VS Percent: 11 %
Date Time Interrogation Session: 20181018185106
Implantable Lead Implant Date: 19970926
Implantable Lead Location: 753859
Implantable Lead Model: 4024
Implantable Lead Model: 4524
Lead Channel Impedance Value: 887 Ohm
Lead Channel Pacing Threshold Amplitude: 1.5 V
Lead Channel Sensing Intrinsic Amplitude: 11.2 mV
Lead Channel Sensing Intrinsic Amplitude: 2 mV
Lead Channel Setting Pacing Amplitude: 2.75 V
Lead Channel Setting Pacing Pulse Width: 0.4 ms
MDC IDC LEAD IMPLANT DT: 19970926
MDC IDC LEAD LOCATION: 753860
MDC IDC MSMT BATTERY REMAINING LONGEVITY: 113 mo
MDC IDC MSMT BATTERY VOLTAGE: 2.78 V
MDC IDC MSMT LEADCHNL RA IMPEDANCE VALUE: 334 Ohm
MDC IDC MSMT LEADCHNL RA PACING THRESHOLD PULSEWIDTH: 0.4 ms
MDC IDC MSMT LEADCHNL RV PACING THRESHOLD AMPLITUDE: 0.75 V
MDC IDC MSMT LEADCHNL RV PACING THRESHOLD PULSEWIDTH: 0.4 ms
MDC IDC PG IMPLANT DT: 20171214
MDC IDC SET LEADCHNL RV PACING AMPLITUDE: 2.5 V
MDC IDC SET LEADCHNL RV SENSING SENSITIVITY: 4 mV
MDC IDC STAT BRADY AS VP PERCENT: 0 %

## 2017-03-20 NOTE — Progress Notes (Signed)
Pacemaker check in clinic. Normal device function. Thresholds, sensing, impedances consistent with previous measurements. Device programmed to maximize longevity. 1 mode switch (<0.1%), No EGMs available, episode <1 minute. No high ventricular rates noted. Device programmed at appropriate safety margins. Histogram distribution appropriate for patient activity level. Device programmed to optimize intrinsic conduction. Estimated longevity 9.5 years. Patient education completed. Carelink monitor paired and education done. ROV with SK 06/11/2017.

## 2017-03-27 DIAGNOSIS — Z23 Encounter for immunization: Secondary | ICD-10-CM | POA: Diagnosis not present

## 2017-04-02 DIAGNOSIS — Z171 Estrogen receptor negative status [ER-]: Secondary | ICD-10-CM | POA: Diagnosis not present

## 2017-04-02 DIAGNOSIS — C50912 Malignant neoplasm of unspecified site of left female breast: Secondary | ICD-10-CM | POA: Diagnosis not present

## 2017-04-02 DIAGNOSIS — Z006 Encounter for examination for normal comparison and control in clinical research program: Secondary | ICD-10-CM | POA: Diagnosis not present

## 2017-04-02 DIAGNOSIS — Z950 Presence of cardiac pacemaker: Secondary | ICD-10-CM | POA: Diagnosis not present

## 2017-04-16 DIAGNOSIS — R69 Illness, unspecified: Secondary | ICD-10-CM | POA: Diagnosis not present

## 2017-04-17 DIAGNOSIS — Z1211 Encounter for screening for malignant neoplasm of colon: Secondary | ICD-10-CM | POA: Diagnosis not present

## 2017-04-30 ENCOUNTER — Ambulatory Visit: Payer: Medicare HMO | Admitting: Radiation Oncology

## 2017-04-30 ENCOUNTER — Telehealth: Payer: Self-pay | Admitting: Oncology

## 2017-04-30 NOTE — Telephone Encounter (Signed)
Called patient regarding her appointment today.  She would like to reschedule.  She was transferred to [facility], Art therapist.

## 2017-05-11 ENCOUNTER — Ambulatory Visit: Payer: Medicare HMO | Admitting: Radiation Oncology

## 2017-05-27 DIAGNOSIS — E039 Hypothyroidism, unspecified: Secondary | ICD-10-CM | POA: Insufficient documentation

## 2017-05-27 DIAGNOSIS — E079 Disorder of thyroid, unspecified: Secondary | ICD-10-CM | POA: Insufficient documentation

## 2017-05-27 DIAGNOSIS — I495 Sick sinus syndrome: Secondary | ICD-10-CM | POA: Insufficient documentation

## 2017-05-27 DIAGNOSIS — K279 Peptic ulcer, site unspecified, unspecified as acute or chronic, without hemorrhage or perforation: Secondary | ICD-10-CM | POA: Insufficient documentation

## 2017-05-27 DIAGNOSIS — K219 Gastro-esophageal reflux disease without esophagitis: Secondary | ICD-10-CM | POA: Insufficient documentation

## 2017-05-27 DIAGNOSIS — Z923 Personal history of irradiation: Secondary | ICD-10-CM | POA: Insufficient documentation

## 2017-05-27 DIAGNOSIS — Z950 Presence of cardiac pacemaker: Secondary | ICD-10-CM | POA: Insufficient documentation

## 2017-05-27 DIAGNOSIS — I10 Essential (primary) hypertension: Secondary | ICD-10-CM | POA: Insufficient documentation

## 2017-05-27 DIAGNOSIS — M519 Unspecified thoracic, thoracolumbar and lumbosacral intervertebral disc disorder: Secondary | ICD-10-CM | POA: Insufficient documentation

## 2017-05-27 DIAGNOSIS — M1990 Unspecified osteoarthritis, unspecified site: Secondary | ICD-10-CM | POA: Insufficient documentation

## 2017-05-27 DIAGNOSIS — K922 Gastrointestinal hemorrhage, unspecified: Secondary | ICD-10-CM | POA: Insufficient documentation

## 2017-05-27 DIAGNOSIS — J309 Allergic rhinitis, unspecified: Secondary | ICD-10-CM | POA: Insufficient documentation

## 2017-05-27 DIAGNOSIS — Z85828 Personal history of other malignant neoplasm of skin: Secondary | ICD-10-CM | POA: Insufficient documentation

## 2017-06-04 ENCOUNTER — Encounter: Payer: Self-pay | Admitting: Radiation Oncology

## 2017-06-04 ENCOUNTER — Ambulatory Visit
Admission: RE | Admit: 2017-06-04 | Discharge: 2017-06-04 | Disposition: A | Discharge status: Home / Self Care | Payer: Medicare HMO | Source: Ambulatory Visit | Attending: Radiation Oncology | Admitting: Radiation Oncology

## 2017-06-04 ENCOUNTER — Other Ambulatory Visit: Payer: Self-pay

## 2017-06-04 DIAGNOSIS — Z853 Personal history of malignant neoplasm of breast: Secondary | ICD-10-CM | POA: Diagnosis not present

## 2017-06-04 DIAGNOSIS — Z880 Allergy status to penicillin: Secondary | ICD-10-CM | POA: Insufficient documentation

## 2017-06-04 DIAGNOSIS — Z171 Estrogen receptor negative status [ER-]: Secondary | ICD-10-CM | POA: Insufficient documentation

## 2017-06-04 DIAGNOSIS — Z08 Encounter for follow-up examination after completed treatment for malignant neoplasm: Secondary | ICD-10-CM | POA: Diagnosis not present

## 2017-06-04 DIAGNOSIS — Z79899 Other long term (current) drug therapy: Secondary | ICD-10-CM | POA: Insufficient documentation

## 2017-06-04 DIAGNOSIS — C50412 Malignant neoplasm of upper-outer quadrant of left female breast: Secondary | ICD-10-CM | POA: Diagnosis not present

## 2017-06-04 NOTE — Progress Notes (Signed)
Valla is here for follow up after treatment to her left breast.  She denies having pain.  She said that she is still fatigued in the afternoons.  She is working full time.  The skin on her left breast is pink with hyperpigmentation.  She is using vitamin E cream.  BP 127/69 (BP Location: Right Arm, Patient Position: Sitting)   Pulse 60   Temp 97.9 F (36.6 C) (Oral)   Ht 5\' 3"  (1.6 m)   Wt 159 lb 6.4 oz (72.3 kg)   SpO2 100%   BMI 28.24 kg/m    Wt Readings from Last 3 Encounters:  06/04/17 159 lb 6.4 oz (72.3 kg)  02/27/17 155 lb 6.4 oz (70.5 kg)  01/27/17 154 lb 1.6 oz (69.9 kg)

## 2017-06-04 NOTE — Progress Notes (Signed)
Radiation Oncology         [phone] ________________________________  Name: [name] MRN: [id number]  Date: 06/04/2017  DOB: 20-Apr-1946   Follow-Up Visit Note  CC: [name], MD  Magrinat, Virgie Dad, MD    ICD-10-CM   1. Malignant neoplasm of upper-outer quadrant of left breast in female, estrogen receptor negative ([place]) C50.412    Z17.1     Diagnosis:   Stage pT1a, p N0 LeftBreast UOQ Invasive Ductal Carcinoma, ER neg/ PR neg/ Her2 pos, Grade 1-2     Interval Since Last Radiation: 9 months  Narrative:  The patient returns today for routine follow-up. She states she is doing well overall. She states she has follow up already scheduled with Dr. Jana Hakim soon.  On review of systems, she reports continued fatigue. She denies lymphadenopathy, nipple bleeding or discharge or pain.   Is able to work full-time                      ALLERGIES:  is allergic to nsaids; penicillins; bee venom; sulfa antibiotics; and wasp venom.  Meds: Current Outpatient Medications  Medication Sig Dispense Refill  . albuterol (PROVENTIL HFA;VENTOLIN HFA) 108 (90 BASE) MCG/ACT inhaler Inhale into the lungs every 6 (six) hours as needed for wheezing or shortness of breath.    Marland Kitchen albuterol (PROVENTIL) (2.5 MG/3ML) 0.083% nebulizer solution Take 2.5 mg by nebulization every 6 (six) hours as needed for wheezing or shortness of breath.    Marland Kitchen atorvastatin (LIPITOR) 80 MG tablet Take 80 mg by mouth every evening.     Marland Kitchen azelastine (ASTELIN) 137 MCG/SPRAY nasal spray Place 2 sprays into both nostrils 2 (two) times daily as needed for allergies. Use in each nostril as directed    . DIHYDROERGOTAMINE MESYLATE NA Inject 2 mLs into the muscle daily as needed (for migraine headaches).     . fenofibrate 160 MG tablet Take 160 mg by mouth every evening.     . Fish Oil OIL Take 1,000 mg by mouth daily.     Marland Kitchen gabapentin (NEURONTIN) 300 MG capsule Take 1 capsule (300 mg total) by mouth at bedtime. 30 capsule 3  .  levothyroxine (SYNTHROID, LEVOTHROID) 100 MCG tablet Take 100 mcg by mouth daily before breakfast.    . triamterene-hydrochlorothiazide (MAXZIDE) 75-50 MG tablet Take 1 tablet by mouth every evening.     . Vitamin D, Cholecalciferol, 1000 UNITS TABS Take 1,000 Units by mouth daily.     . mometasone (NASONEX) 50 MCG/ACT nasal spray Place 2 sprays into the nose daily as needed (for nasal congestion.).     Marland Kitchen omeprazole (PRILOSEC) 20 MG capsule Take 20 mg by mouth daily.     No current facility-administered medications for this encounter.     Physical Findings: The patient is in no acute distress. Patient is alert and oriented.  height is '5\' 3"'$  (1.6 m) and weight is 159 lb 6.4 oz (72.3 kg). Her oral temperature is 97.9 F (36.6 C). Her blood pressure is 127/69 and her pulse is 60. Her oxygen saturation is 100%. .  Lungs are clear to auscultation bilaterally. Heart has regular rate and rhythm. No palpable cervical, supraclavicular, or axillary adenopathy. Abdomen soft, non-tender, normal bowel sounds. Right breast with no palpable mass, nipple discharge or bleeding.  Left breast with hyperpigmentation changes and mild edema. No palpable mass, nipple discharge or bleeding.   Lab Findings: Lab Results  Component Value Date   WBC 5.2 05/30/2016  HGB 13.6 05/30/2016   HCT 40.1 05/30/2016   MCV 86.1 05/30/2016   PLT 334 05/30/2016    Radiographic Findings: No results found.  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   No evidence of recurrence on clinical exam.   Plan:  PRN follow up in radiation oncology. She will continue to follow up with medical oncology and surgery.   -----------------------------------  Blair Promise, PhD, MD  This document serves as a record of services personally performed by Gery Pray, MD. It was created on his behalf by Marlowe Kays, a trained medical scribe. The creation of this  record is based on the scribe's personal observations and the provider's statements to them. This document has been checked and approved by the attending provider.

## 2017-06-11 ENCOUNTER — Encounter: Payer: Medicare HMO | Admitting: Internal Medicine

## 2017-07-24 DIAGNOSIS — K635 Polyp of colon: Secondary | ICD-10-CM | POA: Diagnosis not present

## 2017-07-24 DIAGNOSIS — K5730 Diverticulosis of large intestine without perforation or abscess without bleeding: Secondary | ICD-10-CM | POA: Diagnosis not present

## 2017-07-24 DIAGNOSIS — Z1211 Encounter for screening for malignant neoplasm of colon: Secondary | ICD-10-CM | POA: Diagnosis not present

## 2017-07-28 DIAGNOSIS — Z1211 Encounter for screening for malignant neoplasm of colon: Secondary | ICD-10-CM | POA: Diagnosis not present

## 2017-07-28 DIAGNOSIS — K635 Polyp of colon: Secondary | ICD-10-CM | POA: Diagnosis not present

## 2017-08-12 ENCOUNTER — Encounter: Payer: Self-pay | Admitting: Internal Medicine

## 2017-08-25 ENCOUNTER — Ambulatory Visit (INDEPENDENT_AMBULATORY_CARE_PROVIDER_SITE_OTHER): Payer: Medicare HMO | Admitting: Internal Medicine

## 2017-08-25 ENCOUNTER — Encounter: Payer: Self-pay | Admitting: Internal Medicine

## 2017-08-25 VITALS — BP 130/80 | HR 85 | Ht 64.0 in | Wt 160.0 lb

## 2017-08-25 DIAGNOSIS — I495 Sick sinus syndrome: Secondary | ICD-10-CM | POA: Diagnosis not present

## 2017-08-25 DIAGNOSIS — Z950 Presence of cardiac pacemaker: Secondary | ICD-10-CM

## 2017-08-25 DIAGNOSIS — I447 Left bundle-branch block, unspecified: Secondary | ICD-10-CM

## 2017-08-25 NOTE — Progress Notes (Signed)
Patient Care Team: [name], MD as PCP - General (Family Medicine) Magrinat, Virgie Dad, MD as Consulting Physician (Oncology) Gery Pray, MD as Consulting Physician (Radiation Oncology) Alphonsa Overall, MD as Consulting Physician (General Surgery) Teena Irani, MD (Inactive) as Consulting Physician (Gastroenterology) Gardenia Phlegm, NP as Nurse Practitioner (Hematology and Oncology)   HPI  [name] is a 72 y.o. female Is seen in follow-up for a pacemaker implanted previously on the left side and then moved to the right side to get out of the way of radiation therapy. 12/17  Radiation therapy has just completed.  She complains of some fatigue as well as some shortness of breath.  Device was originally implanted about 15+ years ago and she underwent generator replacement in 2008.  Myoview 8/16 demonstrated normal LV function and no ischemia     Past Medical History:  Diagnosis Date  . Allergic rhinitis   . Arthritis   . Family history of breast cancer   . Family history of colon cancer   . Family history of pancreatic cancer   . GERD (gastroesophageal reflux disease)   . GI bleed   . History of basal cell cancer   . History of radiation therapy 08/05/2016 - 09/12/2016   Left Breast 50.4 Gy 28 fractions  . Hyperlipidemia   . Hypertension   . Hypothyroidism   . Lumbar disc disease   . Malignant neoplasm of upper-outer quadrant of left female breast ([place]) 04/11/2016  . Pacemaker   . Peptic ulcer disease   . SSS (sick sinus syndrome) ([place])   . Thyroid disease     Past Surgical History:  Procedure Laterality Date  . ABDOMINAL HYSTERECTOMY    . BREAST LUMPECTOMY WITH RADIOACTIVE SEED AND SENTINEL LYMPH NODE BIOPSY Left 06/06/2016   Procedure: LEFT BREAST LUMPECTOMY WITH RADIOACTIVE SEED AND LEFT AXILLARY SENTINEL LYMPH NODE BIOPSY;  Surgeon: Alphonsa Overall, MD;  Location: [place];  Service: General;  Laterality: Left;  . BREAST SURGERY     left  . EP  IMPLANTABLE DEVICE N/A 05/15/2016   Procedure: PPM Generator Changeout;  Surgeon: Evans Lance, MD;  Location: [place] [facility];  Service: Cardiovascular;  Laterality: N/A;  . HERNIA REPAIR    . PACEMAKER INSERTION      Current Outpatient Medications  Medication Sig Dispense Refill  . albuterol (PROVENTIL HFA;VENTOLIN HFA) 108 (90 BASE) MCG/ACT inhaler Inhale into the lungs every 6 (six) hours as needed for wheezing or shortness of breath.    Marland Kitchen albuterol (PROVENTIL) (2.5 MG/3ML) 0.083% nebulizer solution Take 2.5 mg by nebulization every 6 (six) hours as needed for wheezing or shortness of breath.    Marland Kitchen atorvastatin (LIPITOR) 80 MG tablet Take 80 mg by mouth every evening.     Marland Kitchen DIHYDROERGOTAMINE MESYLATE NA Inject 2 mLs into the muscle daily as needed (for migraine headaches).     . fenofibrate 160 MG tablet Take 160 mg by mouth every evening.     . Fish Oil OIL Take 1,000 mg by mouth daily.     Marland Kitchen levothyroxine (SYNTHROID, LEVOTHROID) 100 MCG tablet Take 100 mcg by mouth daily before breakfast.    . omeprazole (PRILOSEC) 20 MG capsule Take 20 mg by mouth daily.    Marland Kitchen triamterene-hydrochlorothiazide (MAXZIDE) 75-50 MG tablet Take 1 tablet by mouth every evening.     . Vitamin D, Cholecalciferol, 1000 UNITS TABS Take 1,000 Units by mouth daily.      No current facility-administered  medications for this visit.     Allergies  Allergen Reactions  . Nsaids Other (See Comments)    Upper GI bleed  . Penicillins Itching    Has patient had a PCN reaction causing immediate rash, facial/tongue/throat swelling, SOB or lightheadedness with hypotension:No Has patient had a PCN reaction causing severe rash involving mucus membranes or skin necrosis:No Has patient had a PCN reaction that required hospitalization:No Has patient had a PCN reaction occurring within the last 10 years:No If all of the above answers are "NO", then may proceed with Cephalosporin use.   . Bee Venom   . Sulfa Antibiotics      itching  . Wasp Venom       Review of Systems negative except from HPI and PMH  Physical Exam BP 130/80   Pulse 85   Ht 5\' 4"  (1.626 m)   Wt 160 lb (72.6 kg)   SpO2 97%   BMI 27.46 kg/m  Well developed and nourished in no acute distress HENT normal Neck supple with JVP-7-8 Clear Regular rate and rhythm, no murmurs or gallops Abd-soft with active BS No Clubbing cyanosis edema Skin-warm and dry A & Oriented  Grossly normal sensory and motor function   ECG  A pacing  Assessment and  Plan  Sinus node dysfunction  Breast Cancer   Dyspnea on exertion  Pacemaker L sided translocated to R for  XRT   Her dyspnea may be related to an overzealous rate alogrithm  Will decrease slope, increase thershold  Will check echo for dyspnea--reassess LV function-- there is some volume overload based on JVD  Device on recall but not dependent    We spent more than 50% of our >25 min visit in face to face counseling regarding the above

## 2017-08-25 NOTE — Patient Instructions (Signed)
Medication Instructions:  Your physician recommends that you continue on your current medications as directed. Please refer to the Current Medication list given to you today.  Labwork: None ordered.  Testing/Procedures: Your physician has requested that you have an echocardiogram. Echocardiography is a painless test that uses sound waves to create images of your heart. It provides your doctor with information about the size and shape of your heart and how well your heart's chambers and valves are working. This procedure takes approximately one hour. There are no restrictions for this procedure.   Follow-Up: Your physician recommends that you schedule a follow-up appointment in:   One Year with Dr Caryl Comes  Remote monitoring is used to monitor your Pacemaker from home. This monitoring reduces the number of office visits required to check your device to one time per year. It allows [place] to keep an eye on the functioning of your device to ensure it is working properly. You are scheduled for a device check from home on 11/20/2017. You may send your transmission at any time that day. If you have a wireless device, the transmission will be sent automatically. After your physician reviews your transmission, you will receive a postcard with your next transmission date.    Any Other Special Instructions Will Be Listed Below (If Applicable).     If you need a refill on your cardiac medications before your next appointment, please call your pharmacy.

## 2017-08-26 LAB — CUP PACEART INCLINIC DEVICE CHECK
Brady Statistic AP VS Percent: 85 %
Brady Statistic AS VS Percent: 13 %
Date Time Interrogation Session: 20190326205716
Implantable Lead Implant Date: 19970926
Implantable Lead Location: 753859
Implantable Lead Location: 753860
Implantable Lead Model: 4024
Implantable Pulse Generator Implant Date: 20171214
Lead Channel Impedance Value: 834 Ohm
Lead Channel Pacing Threshold Pulse Width: 0.4 ms
Lead Channel Pacing Threshold Pulse Width: 0.4 ms
MDC IDC LEAD IMPLANT DT: 19970926
MDC IDC MSMT BATTERY IMPEDANCE: 135 Ohm
MDC IDC MSMT BATTERY REMAINING LONGEVITY: 105 mo
MDC IDC MSMT BATTERY VOLTAGE: 2.78 V
MDC IDC MSMT LEADCHNL RA IMPEDANCE VALUE: 335 Ohm
MDC IDC MSMT LEADCHNL RA PACING THRESHOLD AMPLITUDE: 1.5 V
MDC IDC MSMT LEADCHNL RV PACING THRESHOLD AMPLITUDE: 0.75 V
MDC IDC SET LEADCHNL RA PACING AMPLITUDE: 3 V
MDC IDC SET LEADCHNL RV PACING AMPLITUDE: 2.5 V
MDC IDC SET LEADCHNL RV PACING PULSEWIDTH: 0.4 ms
MDC IDC SET LEADCHNL RV SENSING SENSITIVITY: 5.6 mV
MDC IDC STAT BRADY AP VP PERCENT: 2 %
MDC IDC STAT BRADY AS VP PERCENT: 1 %

## 2017-09-03 ENCOUNTER — Other Ambulatory Visit (HOSPITAL_COMMUNITY): Payer: Medicare HMO

## 2017-09-04 ENCOUNTER — Ambulatory Visit (HOSPITAL_COMMUNITY): Payer: Medicare HMO | Attending: Cardiovascular Disease

## 2017-09-04 ENCOUNTER — Other Ambulatory Visit: Payer: Self-pay

## 2017-09-04 DIAGNOSIS — I495 Sick sinus syndrome: Secondary | ICD-10-CM | POA: Diagnosis not present

## 2017-09-04 DIAGNOSIS — I071 Rheumatic tricuspid insufficiency: Secondary | ICD-10-CM | POA: Diagnosis not present

## 2017-09-04 DIAGNOSIS — I371 Nonrheumatic pulmonary valve insufficiency: Secondary | ICD-10-CM | POA: Diagnosis not present

## 2017-09-04 DIAGNOSIS — I447 Left bundle-branch block, unspecified: Secondary | ICD-10-CM | POA: Diagnosis not present

## 2017-09-04 DIAGNOSIS — Z950 Presence of cardiac pacemaker: Secondary | ICD-10-CM | POA: Diagnosis not present

## 2017-09-09 ENCOUNTER — Other Ambulatory Visit: Payer: Self-pay | Admitting: Dermatology

## 2017-09-09 DIAGNOSIS — L570 Actinic keratosis: Secondary | ICD-10-CM | POA: Diagnosis not present

## 2017-09-09 DIAGNOSIS — D229 Melanocytic nevi, unspecified: Secondary | ICD-10-CM | POA: Diagnosis not present

## 2017-09-09 DIAGNOSIS — D0462 Carcinoma in situ of skin of left upper limb, including shoulder: Secondary | ICD-10-CM | POA: Diagnosis not present

## 2017-09-09 DIAGNOSIS — C4492 Squamous cell carcinoma of skin, unspecified: Secondary | ICD-10-CM

## 2017-09-09 DIAGNOSIS — L821 Other seborrheic keratosis: Secondary | ICD-10-CM | POA: Diagnosis not present

## 2017-09-09 DIAGNOSIS — D485 Neoplasm of uncertain behavior of skin: Secondary | ICD-10-CM | POA: Diagnosis not present

## 2017-09-09 HISTORY — DX: Squamous cell carcinoma of skin, unspecified: C44.92

## 2017-09-09 NOTE — Progress Notes (Signed)
[name]  [phone] Fax:(336) [phone]     ID: MCKYLA DECKMAN DOB: Aug 05, 1945  MR#: [id number]  MEQ#:[id number]  Patient Care Team: [name], MD as PCP - General (Family Medicine) Adryan Druckenmiller, Virgie Dad, MD as Consulting Physician (Oncology) Gery Pray, MD as Consulting Physician (Radiation Oncology) Alphonsa Overall, MD as Consulting Physician (General Surgery) Teena Irani, MD (Inactive) as Consulting Physician (Gastroenterology) Delice Bison, Charlestine Massed, NP as Nurse Practitioner (Hematology and Oncology) OTHER MD:  CHIEF COMPLAINT: HER-2 positive breast cancer  CURRENT TREATMENT: Tamoxifen   BREAST CANCER HISTORY: From the original intake note:   [name] had bilateral screening mammography at [facility] 03/28/2016. This found the breast density to be category A. In the left breast at the 12:00 position there was a new oval lesion. There were no other findings of concern. On 04/04/2016 the patient underwent left ultrasonography this showed a possible hypoechoic lesion in the upper outer quadrant of the left breast, measuring approximately 0.5 cm. The left axilla was mammographically benign.  Biopsy of the left breast area in question 04/09/2016 showed ([id number]) invasive ductal carcinoma, grade 1 or 2, estrogen and progesterone receptor negative, with an MIB-1 of 10%, but HER-2 amplified, the signals ratio being 7.36 and the number per cell 9.20.  Her subsequent history is as detailed below.  INTERVAL HISTORY: Milanie returns today for follow-up and treatment of her estrogen receptor positive breast cancer. She is accompanied by her husband. She is no longer taking tamoxifen since our APP felt it was not indicated since the patient's tumor was estrogen receptor negative.  She did not realize the patient was taking it prophylactically.    Since her last visit here, [name] underwent limited left breast ultrasonography at [facility] March 17, 2017 for  evaluation of a change in the left axillary tail.  This proved to be a 4 cm fluid collection.  By color-flow imaging there was no increase in vascularity.  This is consistent with a seroma.   REVIEW OF SYSTEMS: [name] is doing well, overall. She is still working. She stays busy walking and attending church. She walks 3-4 times a week. She denies unusual headaches, visual changes, nausea, vomiting, or dizziness. There has been no unusual cough, phlegm production, or pleurisy. This been no change in bowel or bladder habits. She denies unexplained fatigue or unexplained weight loss, bleeding, rash, or fever. A detailed review of systems was otherwise noncontributory.    PAST MEDICAL HISTORY: Past Medical History:  Diagnosis Date  . Allergic rhinitis   . Arthritis   . Family history of breast cancer   . Family history of colon cancer   . Family history of pancreatic cancer   . GERD (gastroesophageal reflux disease)   . GI bleed   . History of basal cell cancer   . History of radiation therapy 08/05/2016 - 09/12/2016   Left Breast 50.4 Gy 28 fractions  . Hyperlipidemia   . Hypertension   . Hypothyroidism   . Lumbar disc disease   . Malignant neoplasm of upper-outer quadrant of left female breast ([place]) 04/11/2016  . Pacemaker   . Peptic ulcer disease   . SSS (sick sinus syndrome) ([place])   . Thyroid disease     PAST SURGICAL HISTORY: Past Surgical History:  Procedure Laterality Date  . ABDOMINAL HYSTERECTOMY    . BREAST LUMPECTOMY WITH RADIOACTIVE SEED AND SENTINEL LYMPH NODE BIOPSY Left 06/06/2016   Procedure: LEFT BREAST LUMPECTOMY WITH RADIOACTIVE SEED AND LEFT AXILLARY SENTINEL LYMPH NODE BIOPSY;  Surgeon: Alphonsa Overall, MD;  Location: [place];  Service: General;  Laterality: Left;  . BREAST SURGERY     left  . EP IMPLANTABLE DEVICE N/A 05/15/2016   Procedure: PPM Generator Changeout;  Surgeon: Evans Lance, MD;  Location: [facility];  Service: Cardiovascular;  Laterality:  N/A;  . HERNIA REPAIR    . PACEMAKER INSERTION      FAMILY HISTORY Family History  Problem Relation Age of Onset  . Congestive Heart Failure Mother   . Hypertension Father   . Bone cancer Father   . Breast cancer Sister 22  . Bradycardia Cousin   . Breast cancer Cousin 57  . Breast cancer Cousin 47  . Colon cancer Cousin 39  . Pancreatic cancer Paternal Aunt 76  . Rectal cancer Paternal Aunt 21  . Heart attack Maternal Grandmother   . Heart attack Paternal Grandmother   . Stroke Paternal Grandfather   . Melanoma Daughter        dx in her 50s  The patient's father died at the age of 60 with myeloma. The patient's mother died at the age of 45 from heart disease the patient has a sister diagnosed with breast cancer at age 46, a paternal cousin diagnosed with breast cancer at age 8, and a maternal cousin diagnosed with breast cancer. There is also a history of pancreatic, rectal, and: colon cancers all on the father's side of the family  GYNECOLOGIC HISTORY:  No LMP recorded. Patient has had a hysterectomy. Menarche age 75, first live birth age 89, the patient is [place] P2. She stopped having periods in 1981. She did not take hormone replacement. She used oral contraceptives remotely with no complications  SOCIAL HISTORY:  She has worked as a Pharmacist, hospital substituted, in Scientist, research (medical), and more recently as a Engineer, building services. Her husband Herbie BaltimoreMortimer Fries) [place] has been under the care of hospice, however he accompanied the patient to her 09/10/2017 visit.. The patient's daughter [name] lives in [place] where she works as a Pharmacist, hospital. Her daughter [name] lives in [place]. 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     ADVANCED DIRECTIVES: The patient's daughter, [name], is her healthcare power of attorney. She can be reached at [phone]   HEALTH MAINTENANCE: Social History   Tobacco Use  . Smoking status: Never Smoker  . Smokeless tobacco: Never Used  Substance Use Topics    . Alcohol use: No  . Drug use: No     Colonoscopy: 2007/ [facility]  PAP:  Bone density: 03/28/2016 at [facility], T score of -1.3   Allergies  Allergen Reactions  . Nsaids Other (See Comments)    Upper GI bleed  . Penicillins Itching    Has patient had a PCN reaction causing immediate rash, facial/tongue/throat swelling, SOB or lightheadedness with hypotension:No Has patient had a PCN reaction causing severe rash involving mucus membranes or skin necrosis:No Has patient had a PCN reaction that required hospitalization:No Has patient had a PCN reaction occurring within the last 10 years:No If all of the above answers are "NO", then may proceed with Cephalosporin use.   . Bee Venom   . Sulfa Antibiotics     itching  . Wasp Venom     Current Outpatient Medications  Medication Sig Dispense Refill  . albuterol (PROVENTIL HFA;VENTOLIN HFA) 108 (90 BASE) MCG/ACT inhaler Inhale into the lungs every 6 (six) hours as needed for wheezing or shortness of breath.    Marland Kitchen albuterol (PROVENTIL) (2.5  MG/3ML) 0.083% nebulizer solution Take 2.5 mg by nebulization every 6 (six) hours as needed for wheezing or shortness of breath.    Marland Kitchen atorvastatin (LIPITOR) 80 MG tablet Take 80 mg by mouth every evening.     Marland Kitchen DIHYDROERGOTAMINE MESYLATE NA Inject 2 mLs into the muscle daily as needed (for migraine headaches).     . fenofibrate 160 MG tablet Take 160 mg by mouth every evening.     . Fish Oil OIL Take 1,000 mg by mouth daily.     Marland Kitchen levothyroxine (SYNTHROID, LEVOTHROID) 100 MCG tablet Take 100 mcg by mouth daily before breakfast.    . omeprazole (PRILOSEC) 20 MG capsule Take 20 mg by mouth daily.    Marland Kitchen triamterene-hydrochlorothiazide (MAXZIDE) 75-50 MG tablet Take 1 tablet by mouth every evening.     . Vitamin D, Cholecalciferol, 1000 UNITS TABS Take 1,000 Units by mouth daily.      No current facility-administered medications for this visit.     OBJECTIVE: Middle-aged white woman who appears well  Vitals:    09/10/17 1518  BP: 133/62  Pulse: 69  Resp: 18  Temp: 98.9 F (37.2 C)  SpO2: 99%     Body mass index is 27.52 kg/m.    ECOG FS:0 - Asymptomatic  Sclerae unicteric, pupils round and equal Oropharynx clear and moist No cervical or supraclavicular adenopathy Lungs no rales or rhonchi Heart regular rate and rhythm Abd soft, nontender, positive bowel sounds MSK no focal spinal tenderness, no upper extremity lymphedema Neuro: nonfocal, well oriented, appropriate affect Breasts: The right breast is benign.  The left breast is status post lumpectomy and radiation.  The seroma in the left axilla was drained and is not apparent today.  There is no erythema or evidence of disease recurrence.  Both axillae are benign Skin: The patient had a lesion in the left forearm which was biopsied by Dr. Denna Haggard this week, with results pending.  LAB RESULTS:  CMP     Component Value Date/Time   NA 139 09/10/2017 1505   NA 142 04/16/2016 0822   K 4.1 09/10/2017 1505   K 3.8 04/16/2016 0822   CL 101 09/10/2017 1505   CO2 29 09/10/2017 1505   CO2 23 04/16/2016 0822   GLUCOSE 118 09/10/2017 1505   GLUCOSE 102 04/16/2016 0822   BUN 18 09/10/2017 1505   BUN 25.3 04/16/2016 0822   CREATININE 0.84 09/10/2017 1505   CREATININE 0.97 (H) 05/01/2016 1627   CREATININE 0.8 04/16/2016 0822   CALCIUM 9.4 09/10/2017 1505   CALCIUM 9.7 04/16/2016 0822   PROT 6.6 09/10/2017 1505   PROT 6.9 04/16/2016 0822   ALBUMIN 3.8 09/10/2017 1505   ALBUMIN 3.8 04/16/2016 0822   AST 17 09/10/2017 1505   AST 20 04/16/2016 0822   ALT 16 09/10/2017 1505   ALT 18 04/16/2016 0822   ALKPHOS 81 09/10/2017 1505   ALKPHOS 68 04/16/2016 0822   BILITOT 0.9 09/10/2017 1505   BILITOT 0.60 04/16/2016 0822   GFRNONAA >60 09/10/2017 1505   GFRAA >60 09/10/2017 1505    INo results found for: SPEP, UPEP  Lab Results  Component Value Date   WBC 9.2 09/10/2017   NEUTROABS 6.8 (H) 09/10/2017   HGB 14.4 09/10/2017   HCT 42.4  09/10/2017   MCV 88.3 09/10/2017   PLT 250 09/10/2017      Chemistry      Component Value Date/Time   NA 139 09/10/2017 1505   NA 142 04/16/2016 4193  K 4.1 09/10/2017 1505   K 3.8 04/16/2016 0822   CL 101 09/10/2017 1505   CO2 29 09/10/2017 1505   CO2 23 04/16/2016 0822   BUN 18 09/10/2017 1505   BUN 25.3 04/16/2016 0822   CREATININE 0.84 09/10/2017 1505   CREATININE 0.97 (H) 05/01/2016 1627   CREATININE 0.8 04/16/2016 0822      Component Value Date/Time   CALCIUM 9.4 09/10/2017 1505   CALCIUM 9.7 04/16/2016 0822   ALKPHOS 81 09/10/2017 1505   ALKPHOS 68 04/16/2016 0822   AST 17 09/10/2017 1505   AST 20 04/16/2016 0822   ALT 16 09/10/2017 1505   ALT 18 04/16/2016 0822   BILITOT 0.9 09/10/2017 1505   BILITOT 0.60 04/16/2016 0822       No results found for: LABCA2  No components found for: LABCA125  No results for input(s): INR in the last 168 hours.  Urinalysis No results found for: COLORURINE, APPEARANCEUR, LABSPEC, PHURINE, GLUCOSEU, HGBUR, BILIRUBINUR, KETONESUR, PROTEINUR, UROBILINOGEN, NITRITE, LEUKOCYTESUR   STUDIES: No results found.  ELIGIBLE FOR AVAILABLE RESEARCH PROTOCOL: no  ASSESSMENT: 72 y.o. [place] woman status post left breast upper outer quadrant biopsy 04/09/2016 for a clinical T1a N0, stage IA invasive ductal carcinoma, grade 1 or 2, estrogen and progesterone receptor negative, with an MIB-1 of 10%, but HER-2 amplified  (1) left lumpectomy and sentinel lymph node samplng 06/06/2016 showed a pT1a pN0, stage IA  Invasive ductal carcinoma , grade 2 , with negatve margins  (2) No consideration of anti-HER-2 immunotherapy/chemotherapy given final tumor size  (3) adjuvant radiation 08/05/16-09/12/16 Site/dose:   Left breast/ 50.4 Gy in 28 fractions  (a) the patient's pacemaker has been moved to the contralateral side to facilitate treatment   (4)  Genetics testing through the Comprehensive Cancer Panel offered by [facility]  found no  deleterious mutations in APC, ATM, AXIN2, BARD1, BMPR1A, BRCA1, BRCA2, BRIP1, CDH1, CDK4, CDKN2A, CHEK2, EPCAM, FANCC, MLH1, MSH2, MSH6, MUTYH, NBN, PALB2, PMS2, POLD1, POLE, PTEN, RAD51C, RAD51D, SCG5/GREM1, SMAD4, STK11, TP53, VHL, and XRCC2.     (5) started tamoxifen 12/13/2016 for prophylaxis  (a) bone density 08/07/2003 showed a T score of -1.0.  (b) bone density at [facility] 03/28/2016 showed a T score of -1.3.  PLAN: Rylee is now about a year and a half out from her lumpectomy with no evidence of disease recurrence.  This is favorable.  She remembers having some hot flashes with tamoxifen but does not recall that is being too much of a problem.  She was taken off tamoxifen by our APP who did not realize the patient was taking it for prophylaxis.  I reviewed all this again with Freda Munro.  She understands antiestrogens would have no effect on the risk of recurrence from her estrogen receptor negative cancer.  She does have a risk of approximately 1 %/year of developing a new breast cancer and that is where the tamoxifen would be helpful as it would cut that in approximately half.  She is very interested in prophylaxis and would like to resume tamoxifen.  I am glad to prescribe that for her.  She is aware of the possible side effects toxicities and complications.  She will let me know if she has any significant side effects from it.  Otherwise she has multiple follow-ups with Dr. Lucia Gaskins, Dr. Dorathy Daft, Dr. Caryl Comes, and other physicians.  Accordingly I will see her again in 1 year.  She knows to call for any other problems that may develop before that visit.  Anothy Bufano, Virgie Dad, MD  09/10/17 3:46 PM Medical Oncology and Hematology [facility] [address] [place], [place] [address] Tel. [phone]    Fax. [phone]  This document serves as a record of services personally performed by Chauncey Cruel, MD. It was created on his behalf by Margit Banda, a trained medical  scribe. The creation of this record is based on the scribe's personal observations and the provider's statements to them.   I have reviewed the above documentation for accuracy and completeness, and I agree with the above.

## 2017-09-10 ENCOUNTER — Telehealth: Payer: Self-pay | Admitting: Oncology

## 2017-09-10 ENCOUNTER — Inpatient Hospital Stay: Payer: Medicare HMO | Attending: Oncology | Admitting: Oncology

## 2017-09-10 ENCOUNTER — Inpatient Hospital Stay: Payer: Medicare HMO

## 2017-09-10 ENCOUNTER — Telehealth: Payer: Self-pay | Admitting: Internal Medicine

## 2017-09-10 VITALS — BP 133/62 | HR 69 | Temp 98.9°F | Resp 18 | Ht 64.0 in | Wt 160.3 lb

## 2017-09-10 DIAGNOSIS — Z171 Estrogen receptor negative status [ER-]: Secondary | ICD-10-CM

## 2017-09-10 DIAGNOSIS — Z800 Family history of malignant neoplasm of digestive organs: Secondary | ICD-10-CM | POA: Diagnosis not present

## 2017-09-10 DIAGNOSIS — C50412 Malignant neoplasm of upper-outer quadrant of left female breast: Secondary | ICD-10-CM

## 2017-09-10 DIAGNOSIS — Z85828 Personal history of other malignant neoplasm of skin: Secondary | ICD-10-CM

## 2017-09-10 DIAGNOSIS — Z950 Presence of cardiac pacemaker: Secondary | ICD-10-CM | POA: Insufficient documentation

## 2017-09-10 DIAGNOSIS — Z923 Personal history of irradiation: Secondary | ICD-10-CM

## 2017-09-10 DIAGNOSIS — Z803 Family history of malignant neoplasm of breast: Secondary | ICD-10-CM | POA: Diagnosis not present

## 2017-09-10 DIAGNOSIS — Z79811 Long term (current) use of aromatase inhibitors: Secondary | ICD-10-CM

## 2017-09-10 DIAGNOSIS — Z79899 Other long term (current) drug therapy: Secondary | ICD-10-CM | POA: Diagnosis not present

## 2017-09-10 DIAGNOSIS — Z170 Estrogen receptor positive status [ER+]: Secondary | ICD-10-CM | POA: Insufficient documentation

## 2017-09-10 LAB — CBC WITH DIFFERENTIAL/PLATELET
BASOS PCT: 0 %
Basophils Absolute: 0 10*3/uL (ref 0.0–0.1)
Eosinophils Absolute: 0.3 10*3/uL (ref 0.0–0.5)
Eosinophils Relative: 3 %
HEMATOCRIT: 42.4 % (ref 34.8–46.6)
HEMOGLOBIN: 14.4 g/dL (ref 11.6–15.9)
Lymphocytes Relative: 17 %
Lymphs Abs: 1.6 10*3/uL (ref 0.9–3.3)
MCH: 30 pg (ref 25.1–34.0)
MCHC: 34 g/dL (ref 31.5–36.0)
MCV: 88.3 fL (ref 79.5–101.0)
MONOS PCT: 7 %
Monocytes Absolute: 0.6 10*3/uL (ref 0.1–0.9)
NEUTROS PCT: 73 %
Neutro Abs: 6.8 10*3/uL — ABNORMAL HIGH (ref 1.5–6.5)
Platelets: 250 10*3/uL (ref 145–400)
RBC: 4.8 MIL/uL (ref 3.70–5.45)
RDW: 13 % (ref 11.2–14.5)
WBC: 9.2 10*3/uL (ref 3.9–10.3)

## 2017-09-10 LAB — COMPREHENSIVE METABOLIC PANEL
ALK PHOS: 81 U/L (ref 40–150)
ALT: 16 U/L (ref 0–55)
ANION GAP: 9 (ref 3–11)
AST: 17 U/L (ref 5–34)
Albumin: 3.8 g/dL (ref 3.5–5.0)
BUN: 18 mg/dL (ref 7–26)
CALCIUM: 9.4 mg/dL (ref 8.4–10.4)
CO2: 29 mmol/L (ref 22–29)
Chloride: 101 mmol/L (ref 98–109)
Creatinine, Ser: 0.84 mg/dL (ref 0.60–1.10)
GFR calc non Af Amer: >60 mL/min (ref >60)
Glucose, Bld: 118 mg/dL (ref 70–140)
Potassium: 4.1 mmol/L (ref 3.5–5.1)
SODIUM: 139 mmol/L (ref 136–145)
Total Bilirubin: 0.9 mg/dL (ref 0.2–1.2)
Total Protein: 6.6 g/dL (ref 6.4–8.3)

## 2017-09-10 MED ORDER — TAMOXIFEN CITRATE 20 MG PO TABS: 20.0000 mg | ORAL_TABLET | Freq: Every day | ORAL | 12 refills | Status: AC

## 2017-09-10 NOTE — Telephone Encounter (Signed)
Gave avs and calendar ° °

## 2017-09-10 NOTE — Telephone Encounter (Signed)
Responded to pt call and told her Dr Caryl Comes has been out of the office but will be back next week. He has not had an opportunity to read or make recommendations regarding her Echo, but look to hear back from [place] next week. She verbalized understanding and had no additional questions.

## 2017-09-10 NOTE — Telephone Encounter (Signed)
New message ° ° ° °Patient calling for echo results. Please call °

## 2017-09-11 DIAGNOSIS — L237 Allergic contact dermatitis due to plants, except food: Secondary | ICD-10-CM | POA: Diagnosis not present

## 2017-09-11 DIAGNOSIS — J029 Acute pharyngitis, unspecified: Secondary | ICD-10-CM | POA: Diagnosis not present

## 2017-09-11 DIAGNOSIS — H00012 Hordeolum externum right lower eyelid: Secondary | ICD-10-CM | POA: Diagnosis not present

## 2017-09-17 ENCOUNTER — Telehealth: Payer: Self-pay | Admitting: Internal Medicine

## 2017-09-17 NOTE — Telephone Encounter (Signed)
New message    Patient calling to discuss echo

## 2017-09-18 NOTE — Telephone Encounter (Signed)
LVM for pt to call back.

## 2017-09-18 NOTE — Telephone Encounter (Signed)
Spoke with pt. She is awaiting recommendation on her ECHO.

## 2017-09-18 NOTE — Telephone Encounter (Signed)
Follow Up:     Retuning your call from yesterday,concerning her Echo results.

## 2017-09-21 ENCOUNTER — Encounter: Payer: Self-pay | Admitting: *Deleted

## 2017-09-24 ENCOUNTER — Telehealth: Payer: Self-pay

## 2017-09-24 NOTE — Telephone Encounter (Signed)
Today I called to report pt's echo results and Dr Olin Pia recommendation for cardia CT. Pt is c/o extreme daily fatigue and going to bed at 8pm every night sleeping 8-10hrs per night. She states "This is not my usual. I am a person who typically only sleeps 5 hrs a day." She has complaints of increased daily SOB since her visit in March. She cannot take a flight of stairs without resting. She also states she has morning indigestion not relieved by PPI's or antacids. I told her if at any time her symptoms worsen, she should visit the ER for evaluation. In the meantime, I will notify Dr Caryl Comes for further recommendation.

## 2017-09-24 NOTE — Telephone Encounter (Signed)
-----   Message from Deboraha Sprang, MD sent at 09/22/2017  9:35 PM EDT ----- Please Inform Patient Echo showed  Normal  heart muscle function   I had spent a few extra days as I was asking my colleague to try and compare it to a study from 2007 as the new one shows an area of heart muscle less than normal vigor-- I was trying to figure it if new as it wasn't described.  The study is too old and cant find it The stress test from 2016 did not show anything so probably ok   to follow-- burt more thoroughly   we could do a CT scan to make sure there is no blockage that emerged as a consequence of radiation

## 2017-09-28 ENCOUNTER — Ambulatory Visit (INDEPENDENT_AMBULATORY_CARE_PROVIDER_SITE_OTHER): Payer: Medicare HMO | Admitting: Internal Medicine

## 2017-09-28 ENCOUNTER — Encounter: Payer: Self-pay | Admitting: Internal Medicine

## 2017-09-28 VITALS — BP 132/80 | HR 70 | Ht 63.0 in | Wt 161.0 lb

## 2017-09-28 DIAGNOSIS — Z950 Presence of cardiac pacemaker: Secondary | ICD-10-CM

## 2017-09-28 DIAGNOSIS — I495 Sick sinus syndrome: Secondary | ICD-10-CM | POA: Diagnosis not present

## 2017-09-28 DIAGNOSIS — R5383 Other fatigue: Secondary | ICD-10-CM | POA: Diagnosis not present

## 2017-09-28 LAB — CUP PACEART INCLINIC DEVICE CHECK
Brady Statistic AS VS Percent: 30 %
Implantable Lead Implant Date: 19970926
Implantable Lead Location: 753859
Implantable Lead Model: 4524
Implantable Pulse Generator Implant Date: 20171214
Lead Channel Impedance Value: 331 Ohm
Lead Channel Pacing Threshold Amplitude: 0.75 V
Lead Channel Pacing Threshold Pulse Width: 0.4 ms
Lead Channel Sensing Intrinsic Amplitude: 1.4 mV
Lead Channel Sensing Intrinsic Amplitude: 11.2 mV
Lead Channel Setting Pacing Amplitude: 2.5 V
Lead Channel Setting Pacing Amplitude: 2.5 V
Lead Channel Setting Pacing Pulse Width: 0.4 ms
Lead Channel Setting Sensing Sensitivity: 4 mV
MDC IDC LEAD IMPLANT DT: 19970926
MDC IDC LEAD LOCATION: 753860
MDC IDC MSMT BATTERY IMPEDANCE: 135 Ohm
MDC IDC MSMT BATTERY REMAINING LONGEVITY: 115 mo
MDC IDC MSMT BATTERY VOLTAGE: 2.78 V
MDC IDC MSMT LEADCHNL RA PACING THRESHOLD AMPLITUDE: 1.25 V
MDC IDC MSMT LEADCHNL RA PACING THRESHOLD PULSEWIDTH: 0.46 ms
MDC IDC MSMT LEADCHNL RV IMPEDANCE VALUE: 860 Ohm
MDC IDC SESS DTM: 20190429165319
MDC IDC STAT BRADY AP VP PERCENT: 1 %
MDC IDC STAT BRADY AP VS PERCENT: 69 %
MDC IDC STAT BRADY AS VP PERCENT: 0 %

## 2017-09-28 NOTE — Patient Instructions (Signed)
Medication Instructions:  Your physician recommends that you continue on your current medications as directed. Please refer to the Current Medication list given to you today.  Labwork: None ordered.  Testing/Procedures: Your physician has recommended that you have a sleep study. This test records several body functions during sleep, including: brain activity, eye movement, oxygen and carbon dioxide blood levels, heart rate and rhythm, breathing rate and rhythm, the flow of air through your mouth and nose, snoring, body muscle movements, and chest and belly movement.  Follow-Up: Your physician wants you to follow-up in: March 2020. You will receive a reminder letter in the mail two months in advance. If you don't receive a letter, please call our office to schedule the follow-up appointment.  Remote monitoring is used to monitor your Pacemaker of ICD from home. This monitoring reduces the number of office visits required to check your device to one time per year. It allows [place] to keep an eye on the functioning of your device to ensure it is working properly. You are scheduled for a device check from home on 11/24/2017. You may send your transmission at any time that day. If you have a wireless device, the transmission will be sent automatically. After your physician reviews your transmission, you will receive a postcard with your next transmission date.    Any Other Special Instructions Will Be Listed Below (If Applicable).     If you need a refill on your cardiac medications before your next appointment, please call your pharmacy.

## 2017-09-28 NOTE — H&P (View-Only) (Signed)
Patient Care Team: [name], MD as PCP - General (Family Medicine) Magrinat, Virgie Dad, MD as Consulting Physician (Oncology) Gery Pray, MD as Consulting Physician (Radiation Oncology) Alphonsa Overall, MD as Consulting Physician (General Surgery) Teena Irani, MD (Inactive) as Consulting Physician (Gastroenterology) Delice Bison Charlestine Massed, NP as Nurse Practitioner (Hematology and Oncology) Deboraha Sprang, MD as Consulting Physician (Cardiology)   HPI  [name] is a 72 y.o. female Is seen in follow-up for a pacemaker implanted previously on the left side and then moved to the right side to get out of the way of radiation therapy. 12/17  Radiation therapy has just completed.    Device was originally implanted about 15+ years ago and she underwent generator replacement in 2008.  Myoview 8/16 demonstrated normal LV function and no ischemia  She was seen a few weeks ago with DOE and her HR excursion was markedly R shifted  Device reprogrammed  She is no better and may be worse.  Progressive DOE x 4 months or so with reproducible chest tightness with radiation; relieved by rest over 3-5 min  Echo >> overall normal LV function but there was described a new WMA compared to years ago,  Talked with TR and could not find the old study fro direct comparison     Past Medical History:  Diagnosis Date  . Allergic rhinitis   . Arthritis   . Family history of breast cancer   . Family history of colon cancer   . Family history of pancreatic cancer   . GERD (gastroesophageal reflux disease)   . GI bleed   . History of basal cell cancer   . History of radiation therapy 08/05/2016 - 09/12/2016   Left Breast 50.4 Gy 28 fractions  . Hyperlipidemia   . Hypertension   . Hypothyroidism   . Lumbar disc disease   . Malignant neoplasm of upper-outer quadrant of left female breast ([place]) 04/11/2016  . Pacemaker   . Peptic ulcer disease   . SSS (sick sinus syndrome) ([place])   .  Thyroid disease     Past Surgical History:  Procedure Laterality Date  . ABDOMINAL HYSTERECTOMY    . BREAST LUMPECTOMY WITH RADIOACTIVE SEED AND SENTINEL LYMPH NODE BIOPSY Left 06/06/2016   Procedure: LEFT BREAST LUMPECTOMY WITH RADIOACTIVE SEED AND LEFT AXILLARY SENTINEL LYMPH NODE BIOPSY;  Surgeon: Alphonsa Overall, MD;  Location: [facility];  Service: General;  Laterality: Left;  . BREAST SURGERY     left  . EP IMPLANTABLE DEVICE N/A 05/15/2016   Procedure: PPM Generator Changeout;  Surgeon: Evans Lance, MD;  Location: [facility];  Service: Cardiovascular;  Laterality: N/A;  . HERNIA REPAIR    . PACEMAKER INSERTION      Current Outpatient Medications  Medication Sig Dispense Refill  . albuterol (PROVENTIL HFA;VENTOLIN HFA) 108 (90 BASE) MCG/ACT inhaler Inhale into the lungs every 6 (six) hours as needed for wheezing or shortness of breath.    Marland Kitchen albuterol (PROVENTIL) (2.5 MG/3ML) 0.083% nebulizer solution Take 2.5 mg by nebulization every 6 (six) hours as needed for wheezing or shortness of breath.    Marland Kitchen atorvastatin (LIPITOR) 80 MG tablet Take 80 mg by mouth every evening.     Marland Kitchen DIHYDROERGOTAMINE MESYLATE NA Inject 2 mLs into the muscle daily as needed (for migraine headaches).     . fenofibrate 160 MG tablet Take 160 mg by mouth every evening.     . Fish Oil OIL Take 1,000  mg by mouth daily.     Marland Kitchen levothyroxine (SYNTHROID, LEVOTHROID) 100 MCG tablet Take 100 mcg by mouth daily before breakfast.    . omeprazole (PRILOSEC) 20 MG capsule Take 20 mg by mouth daily.    . tamoxifen (NOLVADEX) 20 MG tablet Take 1 tablet (20 mg total) by mouth daily. 90 tablet 12  . triamterene-hydrochlorothiazide (MAXZIDE) 75-50 MG tablet Take 1 tablet by mouth every evening.     . Vitamin D, Cholecalciferol, 1000 UNITS TABS Take 1,000 Units by mouth daily.      No current facility-administered medications for this visit.     Allergies  Allergen Reactions  . Nsaids Other (See Comments)    Upper GI  bleed  . Penicillins Itching    Has patient had a PCN reaction causing immediate rash, facial/tongue/throat swelling, SOB or lightheadedness with hypotension:No Has patient had a PCN reaction causing severe rash involving mucus membranes or skin necrosis:No Has patient had a PCN reaction that required hospitalization:No Has patient had a PCN reaction occurring within the last 10 years:No If all of the above answers are "NO", then may proceed with Cephalosporin use.   . Bee Venom   . Sulfa Antibiotics     itching  . Wasp Venom       Review of Systems negative except from HPI and PMH  Physical Exam BP 132/80   Pulse 70   Ht 5\' 3"  (1.6 m)   Wt 161 lb (73 kg)   SpO2 96%   BMI 28.52 kg/m  Well developed and nourished in no acute distress HENT normal Neck supple with JVP-flat Clear Regular rate and rhythm, no murmurs or gallops Abd-soft with active BS No Clubbing cyanosis edema Skin-warm and dry A & Oriented  Grossly normal sensory and motor function    ECG sinus with out acute STT changes  Assessment and  Plan  Sinus node dysfunction  Breast Cancer   Dyspnea on exertion/Chest discomfort  Pacemaker L sided translocated to R for  XRT   Her dyspnea is worse despite device reprogramming and a normal HR excursion profile  It is noteworthy perhaps that she has exertional chest tightness with radiation from chest >> shoulders It is concurrent with her DOE  Concerned for CAD esp in context of radiation therapy which can accelerate CAD She also needs chest imaging and  discussed with Dr [facility] as to whether he would like to proceed with LHC or CTA; as the former will arrange for CXR  She has significant fatigue and poor sleep with SDBreathing  Will arrange for home sleep study  More than 50% of 40 min was spent in counseling related to the above

## 2017-09-28 NOTE — Progress Notes (Signed)
Patient Care Team: [name], MD as PCP - General (Family Medicine) Magrinat, Virgie Dad, MD as Consulting Physician (Oncology) Gery Pray, MD as Consulting Physician (Radiation Oncology) Alphonsa Overall, MD as Consulting Physician (General Surgery) Teena Irani, MD (Inactive) as Consulting Physician (Gastroenterology) Delice Bison Charlestine Massed, NP as Nurse Practitioner (Hematology and Oncology) Deboraha Sprang, MD as Consulting Physician (Cardiology)   HPI  [name] is a 72 y.o. female Is seen in follow-up for a pacemaker implanted previously on the left side and then moved to the right side to get out of the way of radiation therapy. 12/17  Radiation therapy has just completed.    Device was originally implanted about 15+ years ago and she underwent generator replacement in 2008.  Myoview 8/16 demonstrated normal LV function and no ischemia  She was seen a few weeks ago with DOE and her HR excursion was markedly R shifted  Device reprogrammed  She is no better and may be worse.  Progressive DOE x 4 months or so with reproducible chest tightness with radiation; relieved by rest over 3-5 min  Echo >> overall normal LV function but there was described a new WMA compared to years ago,  Talked with TR and could not find the old study fro direct comparison     Past Medical History:  Diagnosis Date  . Allergic rhinitis   . Arthritis   . Family history of breast cancer   . Family history of colon cancer   . Family history of pancreatic cancer   . GERD (gastroesophageal reflux disease)   . GI bleed   . History of basal cell cancer   . History of radiation therapy 08/05/2016 - 09/12/2016   Left Breast 50.4 Gy 28 fractions  . Hyperlipidemia   . Hypertension   . Hypothyroidism   . Lumbar disc disease   . Malignant neoplasm of upper-outer quadrant of left female breast ([place]) 04/11/2016  . Pacemaker   . Peptic ulcer disease   . SSS (sick sinus syndrome) ([place])   .  Thyroid disease     Past Surgical History:  Procedure Laterality Date  . ABDOMINAL HYSTERECTOMY    . BREAST LUMPECTOMY WITH RADIOACTIVE SEED AND SENTINEL LYMPH NODE BIOPSY Left 06/06/2016   Procedure: LEFT BREAST LUMPECTOMY WITH RADIOACTIVE SEED AND LEFT AXILLARY SENTINEL LYMPH NODE BIOPSY;  Surgeon: Alphonsa Overall, MD;  Location: [place];  Service: General;  Laterality: Left;  . BREAST SURGERY     left  . EP IMPLANTABLE DEVICE N/A 05/15/2016   Procedure: PPM Generator Changeout;  Surgeon: Evans Lance, MD;  Location: [facility];  Service: Cardiovascular;  Laterality: N/A;  . HERNIA REPAIR    . PACEMAKER INSERTION      Current Outpatient Medications  Medication Sig Dispense Refill  . albuterol (PROVENTIL HFA;VENTOLIN HFA) 108 (90 BASE) MCG/ACT inhaler Inhale into the lungs every 6 (six) hours as needed for wheezing or shortness of breath.    Marland Kitchen albuterol (PROVENTIL) (2.5 MG/3ML) 0.083% nebulizer solution Take 2.5 mg by nebulization every 6 (six) hours as needed for wheezing or shortness of breath.    Marland Kitchen atorvastatin (LIPITOR) 80 MG tablet Take 80 mg by mouth every evening.     Marland Kitchen DIHYDROERGOTAMINE MESYLATE NA Inject 2 mLs into the muscle daily as needed (for migraine headaches).     . fenofibrate 160 MG tablet Take 160 mg by mouth every evening.     . Fish Oil OIL Take 1,000  mg by mouth daily.     Marland Kitchen levothyroxine (SYNTHROID, LEVOTHROID) 100 MCG tablet Take 100 mcg by mouth daily before breakfast.    . omeprazole (PRILOSEC) 20 MG capsule Take 20 mg by mouth daily.    . tamoxifen (NOLVADEX) 20 MG tablet Take 1 tablet (20 mg total) by mouth daily. 90 tablet 12  . triamterene-hydrochlorothiazide (MAXZIDE) 75-50 MG tablet Take 1 tablet by mouth every evening.     . Vitamin D, Cholecalciferol, 1000 UNITS TABS Take 1,000 Units by mouth daily.      No current facility-administered medications for this visit.     Allergies  Allergen Reactions  . Nsaids Other (See Comments)    Upper GI  bleed  . Penicillins Itching    Has patient had a PCN reaction causing immediate rash, facial/tongue/throat swelling, SOB or lightheadedness with hypotension:No Has patient had a PCN reaction causing severe rash involving mucus membranes or skin necrosis:No Has patient had a PCN reaction that required hospitalization:No Has patient had a PCN reaction occurring within the last 10 years:No If all of the above answers are "NO", then may proceed with Cephalosporin use.   . Bee Venom   . Sulfa Antibiotics     itching  . Wasp Venom       Review of Systems negative except from HPI and PMH  Physical Exam BP 132/80   Pulse 70   Ht 5\' 3"  (1.6 m)   Wt 161 lb (73 kg)   SpO2 96%   BMI 28.52 kg/m  Well developed and nourished in no acute distress HENT normal Neck supple with JVP-flat Clear Regular rate and rhythm, no murmurs or gallops Abd-soft with active BS No Clubbing cyanosis edema Skin-warm and dry A & Oriented  Grossly normal sensory and motor function    ECG sinus with out acute STT changes  Assessment and  Plan  Sinus node dysfunction  Breast Cancer   Dyspnea on exertion/Chest discomfort  Pacemaker L sided translocated to R for  XRT   Her dyspnea is worse despite device reprogramming and a normal HR excursion profile  It is noteworthy perhaps that she has exertional chest tightness with radiation from chest >> shoulders It is concurrent with her DOE  Concerned for CAD esp in context of radiation therapy which can accelerate CAD She also needs chest imaging and  discussed with Dr [facility] as to whether he would like to proceed with LHC or CTA; as the former will arrange for CXR  She has significant fatigue and poor sleep with SDBreathing  Will arrange for home sleep study  More than 50% of 40 min was spent in counseling related to the above

## 2017-09-29 ENCOUNTER — Telehealth: Payer: Self-pay

## 2017-09-29 ENCOUNTER — Encounter: Payer: Self-pay | Admitting: *Deleted

## 2017-09-29 DIAGNOSIS — R0600 Dyspnea, unspecified: Secondary | ICD-10-CM

## 2017-09-29 DIAGNOSIS — I208 Other forms of angina pectoris: Secondary | ICD-10-CM

## 2017-09-29 NOTE — Addendum Note (Signed)
Addended by: Loren Racer on: 09/29/2017 11:37 AM   Modules accepted: Orders

## 2017-09-29 NOTE — Telephone Encounter (Signed)
-----   Message from Deboraha Sprang, MD sent at 09/28/2017  6:23 PM EDT ----- L  Could you also arrange for 2V CXR

## 2017-09-29 NOTE — Telephone Encounter (Signed)
Spoke with pt and she is agreeable to have cath done on 5/10.  She will come for labs on 5/3 and do CXR same day at [facility].  Advised I would call and schedule cath and then call her back to go over instructions.

## 2017-09-29 NOTE — Telephone Encounter (Signed)
Spoke with pt and went over cath instructions.  Advised pt that letter will be placed at front desk and to pick it up when she comes for labs on Friday.  Pt verbalized understanding and was in agreement with this plan.

## 2017-10-02 ENCOUNTER — Other Ambulatory Visit: Payer: Medicare HMO | Admitting: *Deleted

## 2017-10-02 ENCOUNTER — Encounter (INDEPENDENT_AMBULATORY_CARE_PROVIDER_SITE_OTHER): Payer: Self-pay

## 2017-10-02 ENCOUNTER — Other Ambulatory Visit: Payer: Self-pay | Admitting: Interventional Cardiology

## 2017-10-02 ENCOUNTER — Ambulatory Visit
Admission: RE | Admit: 2017-10-02 | Discharge: 2017-10-02 | Disposition: A | Discharge status: Home / Self Care | Payer: Medicare HMO | Source: Ambulatory Visit | Attending: Interventional Cardiology | Admitting: Interventional Cardiology

## 2017-10-02 DIAGNOSIS — R0600 Dyspnea, unspecified: Secondary | ICD-10-CM

## 2017-10-02 DIAGNOSIS — I208 Other forms of angina pectoris: Secondary | ICD-10-CM | POA: Diagnosis not present

## 2017-10-03 LAB — CBC
HEMATOCRIT: 40.8 % (ref 34.0–46.6)
HEMOGLOBIN: 13.8 g/dL (ref 11.1–15.9)
MCH: 29 pg (ref 26.6–33.0)
MCHC: 33.8 g/dL (ref 31.5–35.7)
MCV: 86 fL (ref 79–97)
Platelets: 319 10*3/uL (ref 150–379)
RBC: 4.76 x10E6/uL (ref 3.77–5.28)
RDW: 14.3 % (ref 12.3–15.4)
WBC: 5 10*3/uL (ref 3.4–10.8)

## 2017-10-03 LAB — BASIC METABOLIC PANEL
BUN / CREAT RATIO: 23 (ref 12–28)
BUN: 19 mg/dL (ref 8–27)
CO2: 25 mmol/L (ref 20–29)
CREATININE: 0.81 mg/dL (ref 0.57–1.00)
Calcium: 9.5 mg/dL (ref 8.7–10.3)
Chloride: 104 mmol/L (ref 96–106)
GFR, EST AFRICAN AMERICAN: 84 mL/min/{1.73_m2} (ref >59)
GFR, EST NON AFRICAN AMERICAN: 73 mL/min/{1.73_m2} (ref >59)
Glucose: 113 mg/dL — ABNORMAL HIGH (ref 65–99)
Potassium: 4.2 mmol/L (ref 3.5–5.2)
Sodium: 143 mmol/L (ref 134–144)

## 2017-10-07 DIAGNOSIS — L237 Allergic contact dermatitis due to plants, except food: Secondary | ICD-10-CM | POA: Diagnosis not present

## 2017-10-08 ENCOUNTER — Telehealth: Payer: Self-pay | Admitting: *Deleted

## 2017-10-08 NOTE — Telephone Encounter (Signed)
-----   Message from Lauralee Evener, [place] sent at 10/07/2017  2:50 PM EDT ----- Regarding: RE: Home sleep Okay to schedule. No pre cert needed ----- Message ----- From: Freada Bergeron, CMA Sent: 10/07/2017  11:05 AM To: [facility] Subject: FW: Home sleep                                 Precert ----- Message ----- From: Dollene Primrose, RN Sent: 10/07/2017  10:24 AM To: Freada Bergeron, CMA, # Subject: Home sleep                                     Hello  I need a precert and home sleep study scheduled.  Thanks!  lorren

## 2017-10-08 NOTE — Telephone Encounter (Signed)
Patient is aware and agreeable to Home Sleep Study through [facility]. Patient is scheduled for 6/21 at 11:45 am to pick up home sleep kit and meet with Respiratory therapist at [facility]. Patient is aware that if this appointment date and time does not work for them they should contact Artis Delay directly at [phone]. Patient is aware that a sleep packet will be sent from [facility] in week. Patient is agreeable to treatment and thankful for call.

## 2017-10-08 NOTE — Telephone Encounter (Signed)
Pt contacted pre-catheterization scheduled at [facility] for: Friday Oct 09, 2017 7:30 AM Verified arrival time and place: [facility] En[facility] A at: 5:30 AM  No solid food after midnight prior to cath, clear liquids until 5 AM day of procedure. Verified allergies in Epic Verified no diabetes medications.  Hold: Triamterene/HCT AM of procedure  Except hold medications AM meds can be  taken pre-cath with sip of water including: ASA 81 mg  Confirmed patient has responsible person to drive home post procedure and observe patient for 24 hours: yes

## 2017-10-09 ENCOUNTER — Ambulatory Visit (HOSPITAL_COMMUNITY)
Admission: RE | Disposition: A | Discharge status: Home / Self Care | Payer: Self-pay | Source: Ambulatory Visit | Attending: Interventional Cardiology

## 2017-10-09 ENCOUNTER — Observation Stay (HOSPITAL_COMMUNITY)
Admission: RE | Admit: 2017-10-09 | Discharge: 2017-10-10 | Disposition: A | Discharge status: Home / Self Care | Payer: Medicare HMO | Source: Ambulatory Visit | Attending: Interventional Cardiology | Admitting: Interventional Cardiology

## 2017-10-09 ENCOUNTER — Observation Stay (HOSPITAL_COMMUNITY): Payer: Medicare HMO

## 2017-10-09 DIAGNOSIS — R0600 Dyspnea, unspecified: Secondary | ICD-10-CM

## 2017-10-09 DIAGNOSIS — Z803 Family history of malignant neoplasm of breast: Secondary | ICD-10-CM | POA: Insufficient documentation

## 2017-10-09 DIAGNOSIS — R0609 Other forms of dyspnea: Secondary | ICD-10-CM | POA: Diagnosis not present

## 2017-10-09 DIAGNOSIS — Z882 Allergy status to sulfonamides status: Secondary | ICD-10-CM | POA: Diagnosis not present

## 2017-10-09 DIAGNOSIS — Z885 Allergy status to narcotic agent status: Secondary | ICD-10-CM | POA: Insufficient documentation

## 2017-10-09 DIAGNOSIS — Z853 Personal history of malignant neoplasm of breast: Secondary | ICD-10-CM | POA: Insufficient documentation

## 2017-10-09 DIAGNOSIS — Z888 Allergy status to other drugs, medicaments and biological substances status: Secondary | ICD-10-CM | POA: Insufficient documentation

## 2017-10-09 DIAGNOSIS — Z8711 Personal history of peptic ulcer disease: Secondary | ICD-10-CM | POA: Diagnosis not present

## 2017-10-09 DIAGNOSIS — E785 Hyperlipidemia, unspecified: Secondary | ICD-10-CM | POA: Diagnosis present

## 2017-10-09 DIAGNOSIS — H538 Other visual disturbances: Secondary | ICD-10-CM | POA: Insufficient documentation

## 2017-10-09 DIAGNOSIS — Z91030 Bee allergy status: Secondary | ICD-10-CM | POA: Insufficient documentation

## 2017-10-09 DIAGNOSIS — Z923 Personal history of irradiation: Secondary | ICD-10-CM | POA: Diagnosis not present

## 2017-10-09 DIAGNOSIS — Z950 Presence of cardiac pacemaker: Secondary | ICD-10-CM | POA: Diagnosis not present

## 2017-10-09 DIAGNOSIS — I2510 Atherosclerotic heart disease of native coronary artery without angina pectoris: Secondary | ICD-10-CM | POA: Diagnosis not present

## 2017-10-09 DIAGNOSIS — Z880 Allergy status to penicillin: Secondary | ICD-10-CM | POA: Diagnosis not present

## 2017-10-09 DIAGNOSIS — I700 Atherosclerosis of aorta: Secondary | ICD-10-CM | POA: Insufficient documentation

## 2017-10-09 DIAGNOSIS — Z90710 Acquired absence of both cervix and uterus: Secondary | ICD-10-CM | POA: Insufficient documentation

## 2017-10-09 DIAGNOSIS — M1990 Unspecified osteoarthritis, unspecified site: Secondary | ICD-10-CM | POA: Insufficient documentation

## 2017-10-09 DIAGNOSIS — K219 Gastro-esophageal reflux disease without esophagitis: Secondary | ICD-10-CM | POA: Diagnosis not present

## 2017-10-09 DIAGNOSIS — Z7951 Long term (current) use of inhaled steroids: Secondary | ICD-10-CM | POA: Insufficient documentation

## 2017-10-09 DIAGNOSIS — I495 Sick sinus syndrome: Secondary | ICD-10-CM | POA: Diagnosis present

## 2017-10-09 DIAGNOSIS — I10 Essential (primary) hypertension: Secondary | ICD-10-CM | POA: Diagnosis present

## 2017-10-09 DIAGNOSIS — Z85828 Personal history of other malignant neoplasm of skin: Secondary | ICD-10-CM | POA: Insufficient documentation

## 2017-10-09 DIAGNOSIS — Z800 Family history of malignant neoplasm of digestive organs: Secondary | ICD-10-CM | POA: Diagnosis not present

## 2017-10-09 DIAGNOSIS — E876 Hypokalemia: Secondary | ICD-10-CM | POA: Insufficient documentation

## 2017-10-09 DIAGNOSIS — J309 Allergic rhinitis, unspecified: Secondary | ICD-10-CM | POA: Diagnosis not present

## 2017-10-09 DIAGNOSIS — Z79899 Other long term (current) drug therapy: Secondary | ICD-10-CM | POA: Insufficient documentation

## 2017-10-09 DIAGNOSIS — I209 Angina pectoris, unspecified: Secondary | ICD-10-CM

## 2017-10-09 DIAGNOSIS — R4182 Altered mental status, unspecified: Secondary | ICD-10-CM | POA: Diagnosis present

## 2017-10-09 DIAGNOSIS — I447 Left bundle-branch block, unspecified: Secondary | ICD-10-CM | POA: Insufficient documentation

## 2017-10-09 DIAGNOSIS — Z7982 Long term (current) use of aspirin: Secondary | ICD-10-CM | POA: Insufficient documentation

## 2017-10-09 DIAGNOSIS — E039 Hypothyroidism, unspecified: Secondary | ICD-10-CM | POA: Diagnosis not present

## 2017-10-09 DIAGNOSIS — Z886 Allergy status to analgesic agent status: Secondary | ICD-10-CM | POA: Insufficient documentation

## 2017-10-09 DIAGNOSIS — H5319 Other subjective visual disturbances: Secondary | ICD-10-CM

## 2017-10-09 DIAGNOSIS — I25119 Atherosclerotic heart disease of native coronary artery with unspecified angina pectoris: Secondary | ICD-10-CM | POA: Diagnosis not present

## 2017-10-09 DIAGNOSIS — I739 Peripheral vascular disease, unspecified: Secondary | ICD-10-CM | POA: Insufficient documentation

## 2017-10-09 DIAGNOSIS — R51 Headache: Secondary | ICD-10-CM | POA: Diagnosis not present

## 2017-10-09 HISTORY — PX: LEFT HEART CATH AND CORONARY ANGIOGRAPHY: CATH118249

## 2017-10-09 LAB — CBC
HEMATOCRIT: 40.3 % (ref 36.0–46.0)
HEMOGLOBIN: 13.5 g/dL (ref 12.0–15.0)
MCH: 29.5 pg (ref 26.0–34.0)
MCHC: 33.5 g/dL (ref 30.0–36.0)
MCV: 88.2 fL (ref 78.0–100.0)
Platelets: 332 10*3/uL (ref 150–400)
RBC: 4.57 MIL/uL (ref 3.87–5.11)
RDW: 13.6 % (ref 11.5–15.5)
WBC: 7.1 10*3/uL (ref 4.0–10.5)

## 2017-10-09 LAB — COMPREHENSIVE METABOLIC PANEL
ALBUMIN: 3.8 g/dL (ref 3.5–5.0)
ALT: 16 U/L (ref 14–54)
ANION GAP: 9 (ref 5–15)
AST: 20 U/L (ref 15–41)
Alkaline Phosphatase: 64 U/L (ref 38–126)
BILIRUBIN TOTAL: 1 mg/dL (ref 0.3–1.2)
BUN: 10 mg/dL (ref 6–20)
CO2: 23 mmol/L (ref 22–32)
Calcium: 8.8 mg/dL — ABNORMAL LOW (ref 8.9–10.3)
Chloride: 107 mmol/L (ref 101–111)
Creatinine, Ser: 0.64 mg/dL (ref 0.44–1.00)
Glucose, Bld: 177 mg/dL — ABNORMAL HIGH (ref 65–99)
Potassium: 3.1 mmol/L — ABNORMAL LOW (ref 3.5–5.1)
Sodium: 139 mmol/L (ref 135–145)
TOTAL PROTEIN: 6.4 g/dL — AB (ref 6.5–8.1)

## 2017-10-09 SURGERY — LEFT HEART CATH AND CORONARY ANGIOGRAPHY
Anesthesia: LOCAL

## 2017-10-09 MED ORDER — SODIUM CHLORIDE 0.9% FLUSH: 3.0000 mL | INTRAVENOUS | Status: DC | PRN

## 2017-10-09 MED ORDER — ONDANSETRON HCL 4 MG/2ML IJ SOLN
4.0000 mg | Freq: Four times a day (QID) | INTRAMUSCULAR | Status: DC | PRN
  Administered 2017-10-09: 4 mg via INTRAVENOUS

## 2017-10-09 MED ORDER — PANTOPRAZOLE SODIUM 40 MG PO TBEC
40.0000 mg | DELAYED_RELEASE_TABLET | Freq: Every day | ORAL | Status: DC
  Administered 2017-10-10: 40 mg via ORAL
  Filled 2017-10-09: qty 1

## 2017-10-09 MED ORDER — HEPARIN SODIUM (PORCINE) 1000 UNIT/ML IJ SOLN
INTRAMUSCULAR | Status: DC | PRN
  Administered 2017-10-09: 4000 [IU] via INTRAVENOUS

## 2017-10-09 MED ORDER — SODIUM CHLORIDE 0.9 % IV SOLN: 250.0000 mL | INTRAVENOUS | Status: DC | PRN

## 2017-10-09 MED ORDER — ALPRAZOLAM 0.25 MG PO TABS
0.2500 mg | ORAL_TABLET | Freq: Two times a day (BID) | ORAL | Status: DC | PRN
  Administered 2017-10-09: 0.25 mg via ORAL
  Filled 2017-10-09: qty 1

## 2017-10-09 MED ORDER — MIDAZOLAM HCL 2 MG/2ML IJ SOLN
INTRAMUSCULAR | Status: AC
  Filled 2017-10-09: qty 2

## 2017-10-09 MED ORDER — VITAMIN D 1000 UNITS PO TABS
1000.0000 [IU] | ORAL_TABLET | Freq: Every day | ORAL | Status: DC
  Administered 2017-10-10: 1000 [IU] via ORAL
  Filled 2017-10-09: qty 1

## 2017-10-09 MED ORDER — SODIUM CHLORIDE 0.9 % IV SOLN: INTRAVENOUS | Status: DC

## 2017-10-09 MED ORDER — MORPHINE SULFATE (PF) 4 MG/ML IV SOLN
2.0000 mg | Freq: Once | INTRAVENOUS | Status: AC
  Administered 2017-10-09: 2 mg via INTRAVENOUS

## 2017-10-09 MED ORDER — DIPHENHYDRAMINE HCL 50 MG/ML IJ SOLN
25.0000 mg | Freq: Once | INTRAMUSCULAR | Status: AC
  Administered 2017-10-09: 25 mg via INTRAVENOUS

## 2017-10-09 MED ORDER — SODIUM CHLORIDE 0.9 % IV SOLN: INTRAVENOUS | Status: AC

## 2017-10-09 MED ORDER — PROMETHAZINE HCL 25 MG/ML IJ SOLN
INTRAMUSCULAR | Status: AC
  Administered 2017-10-09: 12.5 mg via INTRAVENOUS
  Filled 2017-10-09: qty 1

## 2017-10-09 MED ORDER — ACETAMINOPHEN 325 MG PO TABS
ORAL_TABLET | ORAL | Status: AC
  Filled 2017-10-09: qty 2

## 2017-10-09 MED ORDER — ONDANSETRON HCL 4 MG/2ML IJ SOLN: 4.0000 mg | Freq: Four times a day (QID) | INTRAMUSCULAR | Status: DC | PRN

## 2017-10-09 MED ORDER — NITROGLYCERIN 0.4 MG SL SUBL
0.4000 mg | SUBLINGUAL_TABLET | SUBLINGUAL | Status: DC | PRN
Start: 2017-10-09 — End: 2017-10-10

## 2017-10-09 MED ORDER — LEVOTHYROXINE SODIUM 100 MCG PO TABS
100.0000 ug | ORAL_TABLET | Freq: Every day | ORAL | Status: DC
  Administered 2017-10-10: 100 ug via ORAL
  Filled 2017-10-09: qty 1

## 2017-10-09 MED ORDER — ZOLPIDEM TARTRATE 5 MG PO TABS: 5.0000 mg | ORAL_TABLET | Freq: Every evening | ORAL | Status: DC | PRN

## 2017-10-09 MED ORDER — METHYLPREDNISOLONE SODIUM SUCC 125 MG IJ SOLR
INTRAMUSCULAR | Status: AC
  Administered 2017-10-09: 62.5 mg via INTRAVENOUS
  Filled 2017-10-09: qty 2

## 2017-10-09 MED ORDER — SODIUM CHLORIDE 0.9 % WEIGHT BASED INFUSION
3.0000 mL/kg/h | INTRAVENOUS | Status: AC
  Administered 2017-10-09: 3 mL/kg/h via INTRAVENOUS

## 2017-10-09 MED ORDER — SODIUM CHLORIDE 0.9% FLUSH
3.0000 mL | Freq: Two times a day (BID) | INTRAVENOUS | Status: DC
  Administered 2017-10-09 – 2017-10-10 (×2): 3 mL via INTRAVENOUS

## 2017-10-09 MED ORDER — HEPARIN (PORCINE) IN NACL 2-0.9 UNITS/ML
INTRAMUSCULAR | Status: AC | PRN
  Administered 2017-10-09 (×2): 500 mL

## 2017-10-09 MED ORDER — MIDAZOLAM HCL 2 MG/2ML IJ SOLN
INTRAMUSCULAR | Status: DC | PRN
  Administered 2017-10-09 (×2): 1 mg via INTRAVENOUS

## 2017-10-09 MED ORDER — LIDOCAINE HCL (PF) 1 % IJ SOLN
INTRAMUSCULAR | Status: DC | PRN
  Administered 2017-10-09: 5 mL

## 2017-10-09 MED ORDER — SODIUM CHLORIDE 0.9% FLUSH: 3.0000 mL | Freq: Two times a day (BID) | INTRAVENOUS | Status: DC

## 2017-10-09 MED ORDER — IOHEXOL 350 MG/ML SOLN
INTRAVENOUS | Status: DC | PRN
  Administered 2017-10-09: 90 mL via INTRAVENOUS

## 2017-10-09 MED ORDER — ONDANSETRON HCL 4 MG/2ML IJ SOLN
INTRAMUSCULAR | Status: AC
  Administered 2017-10-09: 4 mg via INTRAVENOUS
  Filled 2017-10-09: qty 2

## 2017-10-09 MED ORDER — MORPHINE SULFATE (PF) 4 MG/ML IV SOLN
INTRAVENOUS | Status: AC
  Filled 2017-10-09: qty 1

## 2017-10-09 MED ORDER — VERAPAMIL HCL 2.5 MG/ML IV SOLN
INTRAVENOUS | Status: AC
  Filled 2017-10-09: qty 2

## 2017-10-09 MED ORDER — HYDRALAZINE HCL 20 MG/ML IJ SOLN
INTRAMUSCULAR | Status: AC
  Administered 2017-10-09: 10 mg via INTRAVENOUS
  Filled 2017-10-09: qty 1

## 2017-10-09 MED ORDER — ASPIRIN 81 MG PO CHEW: 81.0000 mg | CHEWABLE_TABLET | ORAL | Status: DC

## 2017-10-09 MED ORDER — ENOXAPARIN SODIUM 40 MG/0.4ML ~~LOC~~ SOLN: 40.0000 mg | SUBCUTANEOUS | Status: DC

## 2017-10-09 MED ORDER — HYDRALAZINE HCL 20 MG/ML IJ SOLN
10.0000 mg | Freq: Once | INTRAMUSCULAR | Status: AC
  Administered 2017-10-09: 10 mg via INTRAVENOUS

## 2017-10-09 MED ORDER — HEPARIN (PORCINE) IN NACL 1000-0.9 UT/500ML-% IV SOLN
INTRAVENOUS | Status: AC
  Filled 2017-10-09: qty 1000

## 2017-10-09 MED ORDER — SODIUM CHLORIDE 0.9 % IV SOLN
Freq: Once | INTRAVENOUS | Status: AC
  Administered 2017-10-09: 21:00:00 via INTRAVENOUS
  Filled 2017-10-09 (×2): qty 1000

## 2017-10-09 MED ORDER — FENOFIBRATE 160 MG PO TABS: 160.0000 mg | ORAL_TABLET | Freq: Every evening | ORAL | Status: DC

## 2017-10-09 MED ORDER — DILTIAZEM HCL ER COATED BEADS 180 MG PO CP24: 180.0000 mg | ORAL_CAPSULE | Freq: Every day | ORAL | 11 refills | Status: DC

## 2017-10-09 MED ORDER — ALBUTEROL SULFATE (2.5 MG/3ML) 0.083% IN NEBU
2.5000 mg | INHALATION_SOLUTION | Freq: Four times a day (QID) | RESPIRATORY_TRACT | Status: DC | PRN
Start: 2017-10-09 — End: 2017-10-10

## 2017-10-09 MED ORDER — ACETAMINOPHEN 325 MG PO TABS
650.0000 mg | ORAL_TABLET | ORAL | Status: DC | PRN
  Administered 2017-10-09 – 2017-10-10 (×4): 650 mg via ORAL
  Filled 2017-10-09 (×3): qty 2

## 2017-10-09 MED ORDER — ATORVASTATIN CALCIUM 80 MG PO TABS: 80.0000 mg | ORAL_TABLET | Freq: Every evening | ORAL | Status: DC

## 2017-10-09 MED ORDER — ACETAMINOPHEN 325 MG PO TABS: 650.0000 mg | ORAL_TABLET | ORAL | Status: DC | PRN

## 2017-10-09 MED ORDER — SODIUM CHLORIDE 0.9 % WEIGHT BASED INFUSION: 1.0000 mL/kg/h | INTRAVENOUS | Status: DC

## 2017-10-09 MED ORDER — METHYLPREDNISOLONE SODIUM SUCC 125 MG IJ SOLR
60.0000 mg | Freq: Once | INTRAMUSCULAR | Status: AC
  Administered 2017-10-09: 62.5 mg via INTRAVENOUS

## 2017-10-09 MED ORDER — DIPHENHYDRAMINE HCL 50 MG/ML IJ SOLN
INTRAMUSCULAR | Status: AC
  Administered 2017-10-09: 25 mg via INTRAVENOUS
  Filled 2017-10-09: qty 1

## 2017-10-09 MED ORDER — VERAPAMIL HCL 2.5 MG/ML IV SOLN
INTRAVENOUS | Status: DC | PRN
  Administered 2017-10-09: 10 mL via INTRA_ARTERIAL

## 2017-10-09 MED ORDER — FENTANYL CITRATE (PF) 100 MCG/2ML IJ SOLN
INTRAMUSCULAR | Status: DC | PRN
  Administered 2017-10-09: 50 ug via INTRAVENOUS
  Administered 2017-10-09: 25 ug via INTRAVENOUS

## 2017-10-09 MED ORDER — ALBUTEROL SULFATE (2.5 MG/3ML) 0.083% IN NEBU: 3.0000 mL | INHALATION_SOLUTION | Freq: Four times a day (QID) | RESPIRATORY_TRACT | Status: DC | PRN

## 2017-10-09 MED ORDER — PROMETHAZINE HCL 25 MG/ML IJ SOLN
12.5000 mg | Freq: Once | INTRAMUSCULAR | Status: AC
  Administered 2017-10-09: 12.5 mg via INTRAVENOUS

## 2017-10-09 MED ORDER — OXYCODONE HCL 5 MG PO TABS
5.0000 mg | ORAL_TABLET | ORAL | Status: DC | PRN
  Administered 2017-10-09: 10 mg via ORAL

## 2017-10-09 MED ORDER — DILTIAZEM HCL ER COATED BEADS 180 MG PO CP24
180.0000 mg | ORAL_CAPSULE | Freq: Every day | ORAL | Status: DC
  Administered 2017-10-09 – 2017-10-10 (×2): 180 mg via ORAL
  Filled 2017-10-09 (×2): qty 1

## 2017-10-09 MED ORDER — HEPARIN SODIUM (PORCINE) 1000 UNIT/ML IJ SOLN
INTRAMUSCULAR | Status: AC
  Filled 2017-10-09: qty 1

## 2017-10-09 MED ORDER — FENTANYL CITRATE (PF) 100 MCG/2ML IJ SOLN
INTRAMUSCULAR | Status: AC
  Filled 2017-10-09: qty 2

## 2017-10-09 MED ORDER — TRIAMTERENE-HCTZ 75-50 MG PO TABS
1.0000 | ORAL_TABLET | Freq: Every evening | ORAL | Status: DC
  Filled 2017-10-09 (×2): qty 1

## 2017-10-09 MED ORDER — LIDOCAINE HCL (PF) 1 % IJ SOLN
INTRAMUSCULAR | Status: AC
  Filled 2017-10-09: qty 30

## 2017-10-09 MED ORDER — OXYCODONE HCL 5 MG PO TABS
ORAL_TABLET | ORAL | Status: AC
  Filled 2017-10-09: qty 2

## 2017-10-09 MED ORDER — TAMOXIFEN CITRATE 10 MG PO TABS
20.0000 mg | ORAL_TABLET | Freq: Every day | ORAL | Status: DC
  Administered 2017-10-10: 20 mg via ORAL
  Filled 2017-10-09 (×2): qty 2

## 2017-10-09 SURGICAL SUPPLY — 14 items
CATH INFINITI 5 FR JL3.5 (CATHETERS) ×1 IMPLANT
CATH INFINITI JR4 5F (CATHETERS) ×1 IMPLANT
COVER PRB 48X5XTLSCP FOLD TPE (BAG) IMPLANT
COVER PROBE 5X48 (BAG) ×2
DEVICE RAD TR BAND REGULAR (VASCULAR PRODUCTS) ×1 IMPLANT
GLIDESHEATH SLEND A-KIT 6F 22G (SHEATH) ×1 IMPLANT
GUIDEWIRE INQWIRE 1.5J.035X260 (WIRE) IMPLANT
INQWIRE 1.5J .035X260CM (WIRE) ×2
KIT HEART LEFT (KITS) ×2 IMPLANT
PACK CARDIAC CATHETERIZATION (CUSTOM PROCEDURE TRAY) ×2 IMPLANT
TRANSDUCER W/STOPCOCK (MISCELLANEOUS) ×2 IMPLANT
TUBING CIL FLEX 10 FLL-RA (TUBING) ×2 IMPLANT
WIRE AQUATRAK .035X150 ANG (WIRE) ×1 IMPLANT
WIRE HI TORQ VERSACORE-J 145CM (WIRE) ×1 IMPLANT

## 2017-10-09 NOTE — Progress Notes (Signed)
Client c/o itching and scratching abd; states feels "a little short of breath" and states had felt "a little panicked" with shortness of breath and states does not feel panicked now; Dr Tamala Julian notified of client complaints and no new orders at present

## 2017-10-09 NOTE — Progress Notes (Addendum)
Nausea and vomiting of 350cc partially digested food; Dr Cheral Marker notified and order noted.  Dr Cheral Marker notified of client c/o 8/10 headache. Dr Cheral Marker notified of K+ level 3.1 and order noted

## 2017-10-09 NOTE — Interval H&P Note (Signed)
Cath Lab Visit (complete for each Cath Lab visit)  Clinical Evaluation Leading to the Procedure:   ACS: No.  Non-ACS:    Anginal Classification: CCS III  Anti-ischemic medical therapy: Minimal Therapy (1 class of medications)  Non-Invasive Test Results: No non-invasive testing performed  Prior CABG: No previous CABG      History and Physical Interval Note:  10/09/2017 7:22 AM  [name]  has presented today for surgery, with the diagnosis of angina - cad  The various methods of treatment have been discussed with the patient and family. After consideration of risks, benefits and other options for treatment, the patient has consented to  Procedure(s): LEFT HEART CATH AND CORONARY ANGIOGRAPHY (N/A) as a surgical intervention .  The patient's history has been reviewed, patient examined, no change in status, stable for surgery.  I have reviewed the patient's chart and labs.  Questions were answered to the patient's satisfaction.     Belva Crome III

## 2017-10-09 NOTE — Progress Notes (Signed)
To CT scan for stat CT without contrast and then transferred to room on 3-W and report given to nurse on 3-W

## 2017-10-09 NOTE — Progress Notes (Signed)
Cath showed non obstructive CAD. No intervention. Post procedure she had visual disturbance "circling and strips going down". Improving but still unclear visit on right side of eye on R peripherally. Symptoms different then her typical migraine.  No slurred speech. Equal strength bilaterally. Will consult neurology.

## 2017-10-09 NOTE — Progress Notes (Signed)
On arrival from cath lab client c/o "seeing floaters"; Dr Tamala Julian aware

## 2017-10-09 NOTE — Consult Note (Addendum)
NEURO HOSPITALIST CONSULT NOTE   Requestig physician: Dr. Tamala Julian  Reason for Consult: Scintillating scotoma following cardiac catheterization  History obtained from:  Patient    HPI:                                                                                                                                          [name] is an 72 y.o. female with history of hypertension, hyperlipidemia, migraine headaches for approximately 40 years.  Patient came to the hospital today for cardiac catheterization.  Status post catheterization she is being moved from the bed to another bed and she noted that she started to wavy lines in her vision.  It was in all of her visual fields.  She was then moved to a room when she noticed she is then saw some scotoma which is then followed by what she describes as circular-like lines that filled her vision.  After this resolved she started to have a throbbing headache on the right aspect of her head which is mostly behind her eye.  She also has some pain that is in her right neck.  By palpation it does not make it worse.  She was given Solu-Medrol along with Benadryl which significantly helped her headache.  She no longer has any visual auras, her headache is rated at a 2/10 and her only complaint now is mild neck pain on the right side.  She had no other neurological symptoms such as weakness, numbness, inability to talk.  She does state that her previous migraines were preceded by spots and what she describes as a kaleidoscope-like visual aspect.  Past Medical History:  Diagnosis Date  . Allergic rhinitis   . Arthritis   . Family history of breast cancer   . Family history of colon cancer   . Family history of pancreatic cancer   . GERD (gastroesophageal reflux disease)   . GI bleed   . History of basal cell cancer   . History of radiation therapy 08/05/2016 - 09/12/2016   Left Breast 50.4 Gy 28 fractions  . Hyperlipidemia   .  Hypertension   . Hypothyroidism   . Lumbar disc disease   . Malignant neoplasm of upper-outer quadrant of left female breast ([place]) 04/11/2016  . Pacemaker   . Peptic ulcer disease   . SSS (sick sinus syndrome) ([place])   . Thyroid disease     Past Surgical History:  Procedure Laterality Date  . ABDOMINAL HYSTERECTOMY    . BREAST LUMPECTOMY WITH RADIOACTIVE SEED AND SENTINEL LYMPH NODE BIOPSY Left 06/06/2016   Procedure: LEFT BREAST LUMPECTOMY WITH RADIOACTIVE SEED AND LEFT AXILLARY SENTINEL LYMPH NODE BIOPSY;  Surgeon: Alphonsa Overall, MD;  Location: [facility];  Service: General;  Laterality: Left;  . BREAST SURGERY  left  . EP IMPLANTABLE DEVICE N/A 05/15/2016   Procedure: PPM Generator Changeout;  Surgeon: Evans Lance, MD;  Location: [facility];  Service: Cardiovascular;  Laterality: N/A;  . HERNIA REPAIR    . PACEMAKER INSERTION      Family History  Problem Relation Age of Onset  . Congestive Heart Failure Mother   . Hypertension Father   . Bone cancer Father   . Breast cancer Sister 31  . Bradycardia Cousin   . Breast cancer Cousin 69  . Breast cancer Cousin 36  . Colon cancer Cousin 15  . Pancreatic cancer Paternal Aunt 76  . Rectal cancer Paternal Aunt 23  . Heart attack Maternal Grandmother   . Heart attack Paternal Grandmother   . Stroke Paternal Grandfather   . Melanoma Daughter        dx in her 63s              Social History:  reports that she has never smoked. She has never used smokeless tobacco. She reports that she does not drink alcohol or use drugs.  Allergies  Allergen Reactions  . Bee Venom Shortness Of Breath and Itching  . Morphine And Related Shortness Of Breath and Itching  . Nsaids Other (See Comments)    Upper GI bleed  . Wasp Venom Shortness Of Breath and Itching  . Penicillins Itching and Other (See Comments)    Has patient had a PCN reaction causing immediate rash, facial/tongue/throat swelling, SOB or lightheadedness with  hypotension:No Has patient had a PCN reaction causing severe rash involving mucus membranes or skin necrosis:No Has patient had a PCN reaction that required hospitalization:No Has patient had a PCN reaction occurring within the last 10 years:No If all of the above answers are "NO", then may proceed with Cephalosporin use.   . Sulfa Antibiotics Itching    MEDICATIONS:                                                                                                                     Prior to Admission:  Medications Prior to Admission  Medication Sig Dispense Refill Last Dose  . atorvastatin (LIPITOR) 80 MG tablet Take 80 mg by mouth every evening.    10/08/2017 at Unknown time  . fenofibrate 160 MG tablet Take 160 mg by mouth every evening.    10/08/2017 at Unknown time  . levothyroxine (SYNTHROID, LEVOTHROID) 100 MCG tablet Take 100 mcg by mouth daily before breakfast.   10/08/2017 at Unknown time  . mometasone (NASONEX) 50 MCG/ACT nasal spray Place 2 sprays into the nose daily.   Past Week at Unknown time  . Omega-3 Fatty Acids (FISH OIL) 1000 MG CAPS Take 1,000 mg by mouth daily.   10/08/2017 at Unknown time  . omeprazole (PRILOSEC) 20 MG capsule Take 20 mg by mouth daily.   10/08/2017 at Unknown time  . tamoxifen (NOLVADEX) 20 MG tablet Take 1 tablet (20 mg total) by mouth daily. 90 tablet 12 10/08/2017  at Unknown time  . triamterene-hydrochlorothiazide (MAXZIDE) 75-50 MG tablet Take 1 tablet by mouth every evening.    10/08/2017 at Unknown time  . Vitamin D, Cholecalciferol, 1000 UNITS TABS Take 1,000 Units by mouth daily.    10/08/2017 at Unknown time  . albuterol (PROVENTIL HFA;VENTOLIN HFA) 108 (90 BASE) MCG/ACT inhaler Inhale 2 puffs into the lungs every 6 (six) hours as needed for wheezing or shortness of breath.    More than a month at Unknown time  . albuterol (PROVENTIL) (2.5 MG/3ML) 0.083% nebulizer solution Take 2.5 mg by nebulization every 6 (six) hours as needed for wheezing or shortness of  breath.   More than a month at Unknown time  . DIHYDROERGOTAMINE MESYLATE NA Inject 2 mLs into the muscle daily as needed (for migraine headaches).    More than a month at Unknown time   Scheduled: . aspirin  81 mg Oral Pre-Cath  . [START ON 10/10/2017] diltiazem  180 mg Oral Daily  . morphine      . oxyCODONE      . sodium chloride flush  3 mL Intravenous Q12H  . sodium chloride flush  3 mL Intravenous Q12H   Continuous: . sodium chloride    . sodium chloride    . sodium chloride 1 mL/kg/hr (10/09/17 0745)     ROS:                                                                                                                                       History obtained from the patient  General ROS: negative for - chills, fatigue, fever, night sweats, weight gain or weight loss Psychological ROS: negative for - behavioral disorder, hallucinations, memory difficulties, mood swings or suicidal ideation Ophthalmic ROS: Positive for -thoughts in her vision, scotoma ENT ROS: negative for - epistaxis, nasal discharge, oral lesions, sore throat, tinnitus or vertigo Allergy and Immunology ROS: negative for - hives or itchy/watery eyes Hematological and Lymphatic ROS: negative for - bleeding problems, bruising or swollen lymph nodes Endocrine ROS: negative for - galactorrhea, hair pattern changes, polydipsia/polyuria or temperature intolerance Respiratory ROS: negative for - cough, hemoptysis, shortness of breath or wheezing Cardiovascular ROS: negative for - chest pain, dyspnea on exertion, edema or irregular heartbeat Gastrointestinal ROS: negative for - abdominal pain, diarrhea, hematemesis, nausea/vomiting or stool incontinence Genito-Urinary ROS: negative for - dysuria, hematuria, incontinence or urinary frequency/urgency Musculoskeletal ROS: negative for - joint swelling or muscular weakness Neurological ROS: as noted in HPI Dermatological ROS: negative for rash and skin lesion  changes   Blood pressure (!) 145/84, pulse 60, temperature 97.8 F (36.6 C), resp. rate (!) 0, height 5\' 3"  (1.6 m), weight 68 kg (150 lb), SpO2 97 %.   General Examination:  Physical Exam  HEENT-  Normocephalic, no lesions, without obvious abnormality.  Normal external eye and conjunctiva.   Abdomen- All 4 quadrants palpated and nontender Extremities- Warm, dry and intact Musculoskeletal-no joint tenderness, deformity or swelling Skin-warm and dry, no hyperpigmentation, vitiligo, or suspicious lesions  Neurological Examination Mental Status: Alert, oriented, thought content appropriate.  Speech fluent without evidence of aphasia.  Able to follow 3 step commands without difficulty. Cranial Nerves: II:; slight crescent of visual loss in right hemifield. PERRL.  III,IV, VI: ptosis not present, extra-ocular motions intact bilaterally  V,VII: smile symmetric, facial light touch sensation normal bilaterally VIII: hearing normal  IX,X: uvula rises symmetrically XI: bilateral shoulder shrug XII: midline tongue extension Motor: Right : Upper extremity   5/5    Left:     Upper extremity   5/5  Lower extremity   5/5     Lower extremity   5/5 Tone and bulk:normal tone throughout; no atrophy noted Sensory: Pinprick and light touch intact throughout, bilaterally Deep Tendon Reflexes: 2+ and symmetric throughout Plantars: Right: downgoing   Left: downgoing Cerebellar: normal finger-to-nose,and normal heel-to-shin test Gait: Not tested   Lab Results: Basic Metabolic Panel: No results for input(s): NA, K, CL, CO2, GLUCOSE, BUN, CREATININE, CALCIUM, MG, PHOS in the last 168 hours.  CBC: No results for input(s): WBC, NEUTROABS, HGB, HCT, MCV, PLT in the last 168 hours.  Cardiac Enzymes: No results for input(s): CKTOTAL, CKMB, CKMBINDEX, TROPONINI in the last 168 hours.  Lipid Panel: No  results for input(s): CHOL, TRIG, HDL, CHOLHDL, VLDL, LDLCALC in the last 168 hours.  Imaging: No results found.  Assessment and plan discussed with attending neurologist. Etta Quill PA-C [facility] [phone]  10/09/2017, 2:22 PM   Assessment/Plan: 72 year old female status post cardiac catheterization.  No intervention was obtained during this cardiac catheterization.  Post cardiac catheterization patient noted some visual changes which included vertical lines in both of her visual fields, scotoma, and a kaleidoscope-like circular visual abnormality.  This was followed by a right sided headache with right neck pain. Patient was given Solu-Medrol and now she feels significant better with no other symptoms.  She only describes a 2/10 headache and some neck pain.  Description of above symptoms most consistent with visual aura followed by migraine headache.    Given patient did have a procedure and has residual crescent of decreased vision in her right hemifield, in addition to right neck pain it would be beneficial to obtain an MRI of her head and neck.  Unfortunately patient has a [employer] pacemaker which is not compatible with MRI.  At this time will obtain a CTA of head and neck.  I have called CT/radiology and ordered a stat CMP.  If she is able to have another dye load today we will obtain that; however, she may need to stay overnight and get fluids to obtain the CT of head and neck tomorrow morning.  Electronically signed: Dr. Kerney Elbe

## 2017-10-09 NOTE — Progress Notes (Signed)
C/O continued itching; no c/o shortness of breath; states continues to have "floaters" right eye

## 2017-10-10 ENCOUNTER — Observation Stay (HOSPITAL_COMMUNITY): Payer: Medicare HMO

## 2017-10-10 ENCOUNTER — Encounter (HOSPITAL_COMMUNITY): Payer: Self-pay

## 2017-10-10 ENCOUNTER — Other Ambulatory Visit: Payer: Self-pay

## 2017-10-10 DIAGNOSIS — H539 Unspecified visual disturbance: Secondary | ICD-10-CM | POA: Diagnosis not present

## 2017-10-10 DIAGNOSIS — E782 Mixed hyperlipidemia: Secondary | ICD-10-CM

## 2017-10-10 DIAGNOSIS — I10 Essential (primary) hypertension: Secondary | ICD-10-CM

## 2017-10-10 DIAGNOSIS — I25119 Atherosclerotic heart disease of native coronary artery with unspecified angina pectoris: Secondary | ICD-10-CM | POA: Diagnosis not present

## 2017-10-10 DIAGNOSIS — I495 Sick sinus syndrome: Secondary | ICD-10-CM

## 2017-10-10 DIAGNOSIS — I209 Angina pectoris, unspecified: Secondary | ICD-10-CM | POA: Diagnosis not present

## 2017-10-10 DIAGNOSIS — R51 Headache: Secondary | ICD-10-CM | POA: Diagnosis not present

## 2017-10-10 DIAGNOSIS — R0609 Other forms of dyspnea: Secondary | ICD-10-CM | POA: Diagnosis not present

## 2017-10-10 LAB — BASIC METABOLIC PANEL
ANION GAP: 10 (ref 5–15)
BUN: 10 mg/dL (ref 6–20)
CHLORIDE: 109 mmol/L (ref 101–111)
CO2: 23 mmol/L (ref 22–32)
CREATININE: 0.7 mg/dL (ref 0.44–1.00)
Calcium: 8.6 mg/dL — ABNORMAL LOW (ref 8.9–10.3)
GFR calc non Af Amer: >60 mL/min (ref >60)
Glucose, Bld: 154 mg/dL — ABNORMAL HIGH (ref 65–99)
Potassium: 3.4 mmol/L — ABNORMAL LOW (ref 3.5–5.1)
Sodium: 142 mmol/L (ref 135–145)

## 2017-10-10 LAB — CBC
HCT: 39 % (ref 36.0–46.0)
HEMOGLOBIN: 12.9 g/dL (ref 12.0–15.0)
MCH: 29.7 pg (ref 26.0–34.0)
MCHC: 33.1 g/dL (ref 30.0–36.0)
MCV: 89.7 fL (ref 78.0–100.0)
Platelets: 310 10*3/uL (ref 150–400)
RBC: 4.35 MIL/uL (ref 3.87–5.11)
RDW: 13.5 % (ref 11.5–15.5)
WBC: 7.4 10*3/uL (ref 4.0–10.5)

## 2017-10-10 MED ORDER — IOPAMIDOL (ISOVUE-370) INJECTION 76%
INTRAVENOUS | Status: AC
  Administered 2017-10-10: 50 mL
  Filled 2017-10-10: qty 50

## 2017-10-10 MED ORDER — POTASSIUM CHLORIDE CRYS ER 20 MEQ PO TBCR
40.0000 meq | EXTENDED_RELEASE_TABLET | Freq: Once | ORAL | Status: AC
  Administered 2017-10-10: 40 meq via ORAL
  Filled 2017-10-10: qty 2

## 2017-10-10 NOTE — Progress Notes (Signed)
Pt got discharged to home, discharge instructions provided and patient showed understanding to it, IV taken out,Telemonitor DC,pt left unit in wheelchair with all of the belongings accompanied with a family member (Daughter)  Kyion Gautier, RN 

## 2017-10-10 NOTE — Progress Notes (Signed)
Progress Note  Patient Name: [name] Date of Encounter: 10/10/2017  Primary Cardiologist: No primary care provider on file.   Subjective   She had another episode of aura followed by vomiting and headache the last night, now resolved.  Inpatient Medications    Scheduled Meds: . atorvastatin  80 mg Oral QPM  . cholecalciferol  1,000 Units Oral Daily  . diltiazem  180 mg Oral Daily  . enoxaparin (LOVENOX) injection  40 mg Subcutaneous Q24H  . fenofibrate  160 mg Oral QPM  . levothyroxine  100 mcg Oral QAC breakfast  . pantoprazole  40 mg Oral Daily  . sodium chloride flush  3 mL Intravenous Q12H  . sodium chloride flush  3 mL Intravenous Q12H  . tamoxifen  20 mg Oral Daily  . triamterene-hydrochlorothiazide  1 tablet Oral QPM   Continuous Infusions: . sodium chloride    . sodium chloride    . sodium chloride 50 mL/hr at 10/09/17 1915   PRN Meds: sodium chloride, sodium chloride, acetaminophen, albuterol, albuterol, ALPRAZolam, nitroGLYCERIN, ondansetron (ZOFRAN) IV, ondansetron (ZOFRAN) IV, oxyCODONE, sodium chloride flush, sodium chloride flush, zolpidem   Vital Signs    Vitals:   10/09/17 1745 10/09/17 2013 10/10/17 0001 10/10/17 0414  BP: (!) 187/85 (!) 171/76 (!) 129/59 137/70  Pulse: 63 (!) 59 60 60  Resp:  18 17 18   Temp:  (!) 97.5 F (36.4 C) 98.3 F (36.8 C) 98.3 F (36.8 C)  TempSrc:  Oral Oral Oral  SpO2: 97% 96% 93% 96%  Weight:    161 lb 14.4 oz (73.4 kg)  Height:        Intake/Output Summary (Last 24 hours) at 10/10/2017 0807 Last data filed at 10/10/2017 0420 Gross per 24 hour  Intake 240 ml  Output 1250 ml  Net -1010 ml   Filed Weights   10/09/17 0541 10/10/17 0414  Weight: 150 lb (68 kg) 161 lb 14.4 oz (73.4 kg)    Telemetry    Atrial pacing- Personally Reviewed  ECG    Atrial paced rhythm - Personally Reviewed  Physical Exam   GEN: No acute distress.   Neck: No JVD Cardiac: RRR, no murmurs, rubs, or gallops.    Respiratory: Clear to auscultation bilaterally. GI: Soft, nontender, non-distended  MS: No edema; No deformity. Neuro:  Nonfocal  Psych: Normal affect   Labs    Chemistry Recent Labs  Lab 10/09/17 1554 10/10/17 0559  NA 139 142  K 3.1* 3.4*  CL 107 109  CO2 23 23  GLUCOSE 177* 154*  BUN 10 10  CREATININE 0.64 0.70  CALCIUM 8.8* 8.6*  PROT 6.4*  --   ALBUMIN 3.8  --   AST 20  --   ALT 16  --   ALKPHOS 64  --   BILITOT 1.0  --   GFRNONAA >60 >60  GFRAA >60 >60  ANIONGAP 9 10     Hematology Recent Labs  Lab 10/09/17 1555 10/10/17 0559  WBC 7.1 7.4  RBC 4.57 4.35  HGB 13.5 12.9  HCT 40.3 39.0  MCV 88.2 89.7  MCH 29.5 29.7  MCHC 33.5 33.1  RDW 13.6 13.5  PLT 332 310    Cardiac EnzymesNo results for input(s): TROPONINI in the last 168 hours. No results for input(s): TROPIPOC in the last 168 hours.   BNPNo results for input(s): BNP, PROBNP in the last 168 hours.   DDimer No results for input(s): DDIMER in the last 168 hours.  Radiology    Ct Head Wo Contrast  Result Date: 10/09/2017 CLINICAL DATA:  Headache and visual changes. Cardiac catheterization. EXAM: CT HEAD WITHOUT CONTRAST TECHNIQUE: Contiguous axial images were obtained from the base of the skull through the vertex without intravenous contrast. COMPARISON:  None. FINDINGS: Brain: No evidence for acute infarction, hemorrhage, mass lesion, hydrocephalus, or extra-axial fluid. Mild atrophy. Slight hypoattenuation of white matter, likely small vessel disease. Vascular: Calcification of the cavernous internal carotid arteries consistent with cerebrovascular atherosclerotic disease. No signs of intracranial large vessel occlusion. Skull: Calvarium intact. Sinuses/Orbits: No sinus fluid.  Negative orbits. Other: None. IMPRESSION: Atrophy and small vessel disease, relatively mild. No acute intracranial findings. Electronically Signed   By: Staci Righter M.D.   On: 10/09/2017 20:07    Cardiac Studies   LHC:  10/09/2017  30 to 40% ostial left main.  There is ostial calcification noted as well.  LAD wraps around the left ventricular apex.  It contains luminal irregularities from the mid to distal vessel obstructing up to 30%.  No high-grade focal stenosis is noted.  Circumflex is dominant.  The PDA contains diffuse 60 to 70% proximal narrowing.  Irregularities are noted in the mid vessel.  Nondominant right coronary without obstructive disease.  Normal left ventricular systolic function with EF 55 to 60%.  Elevated LVEDP of 21 millimeters mercury.  Findings are compatible with chronic diastolic heart failure.  RECOMMENDATIONS:   Aggressive risk factor modification including lipid-lowering LDL less than 70.  High blood pressure control 130/80 mmHg or less.  Adjust medications to lower LVEDP.  May need to add low-dose diuretic therapy.  Patient Profile     72 y.o. female  [place]     1. Chest pain: Cath showed non obstructive CAD. No intervention. Post procedure she had visual disturbance "circling and strips going down". Improving but still unclear visit on right side of eye on R peripherally. Symptoms different then her typical migraine.  No slurred speech. Equal strength bilaterally. Neurology was consulted -  This was followed by a right sided headache with right neck pain. Patient was given Solu-Medrol and now she feels significant better with no other symptoms.  She only describes a 2/10 headache and some neck pain.  Description of above symptoms most consistent with visual aura followed by migraine headache.  MRI ideal, however the patient has aPM, CT head showed: Atrophy and small vessel disease, relatively mild. No acute intracranial findings.  2. SSS - s/p PM placement 3. HTN - goal < 130/80, Dr Tamala Julian added cardizem 180 mg po daily 4. HLP - on max dose of statins 5.Hypokalemia - replace  Discharge today, arrange for a follow up within 2 weeks, follow BP, lipid  profile.  For questions or updates, please contact [place] Please consult [email or web address] for contact info under Cardiology/STEMI.      Signed, Ena Dawley, MD  10/10/2017, 8:07 AM

## 2017-10-10 NOTE — Plan of Care (Signed)
  Problem: Coping: Goal: Level of anxiety will decrease Outcome: Completed/Met   Problem: Elimination: Goal: Will not experience complications related to bowel motility Outcome: Completed/Met   Problem: Pain Managment: Goal: General experience of comfort will improve Outcome: Completed/Met

## 2017-10-10 NOTE — Discharge Summary (Signed)
Discharge Summary    Patient ID: [name],  MRN: [id number], DOB/AGE: 73-Jun-1947 73 y.o.  Admit date: 10/09/2017 Discharge date: 10/10/2017  Primary Care Provider: [name] Primary Cardiologist: Dr. Caryl Comes   Discharge Diagnoses    Principal Problem:   Angina pectoris [facility]) Active Problems:   Essential hypertension   Sick sinus syndrome (HCC)   Hyperlipidemia   Dyspnea   Altered mental state   Visual disturbance with aura  Allergies Allergies  Allergen Reactions  . Bee Venom Shortness Of Breath and Itching  . Morphine And Related Shortness Of Breath and Itching  . Nsaids Other (See Comments)    Upper GI bleed  . Wasp Venom Shortness Of Breath and Itching  . Penicillins Itching and Other (See Comments)    Has patient had a PCN reaction causing immediate rash, facial/tongue/throat swelling, SOB or lightheadedness with hypotension:No Has patient had a PCN reaction causing severe rash involving mucus membranes or skin necrosis:No Has patient had a PCN reaction that required hospitalization:No Has patient had a PCN reaction occurring within the last 10 years:No If all of the above answers are "NO", then may proceed with Cephalosporin use.   . Sulfa Antibiotics Itching    Diagnostic Studies/Procedures    LHC: 10/09/2017  30 to 40% ostial left main. There is ostial calcification noted as well.  LAD wraps around the left ventricular apex. It contains luminal irregularities from the mid to distal vessel obstructing up to 30%. No high-grade focal stenosis is noted.  Circumflex is dominant. The PDA contains diffuse 60 to 70% proximal narrowing. Irregularities are noted in the mid vessel.  Nondominant right coronary without obstructive disease.  Normal left ventricular systolic function with EF 55 to 60%. Elevated LVEDP of 21 millimeters mercury. Findings are compatible with chronic diastolic heart failure.  RECOMMENDATIONS:   Aggressive risk factor  modification including lipid-lowering LDL less than 70. High blood pressure control 130/80 mmHg or less. Adjust medications to lower LVEDP. May need to add low-dose diuretic therapy.    History of Present Illness     [name] is a 72 y.o. female with hx of sinus node dysfunction s/p pacemaker L sided translocated to R for  XRT, breast cancer, HTN, and HLD presented for cath.   Recently seen by Dr. Caryl Comes for chest discomfort and DOE. Her dyspnea is worse despite device reprogramming and a normal HR excursion profile. Concerned for CAD esp in context of radiation therapy which can accelerate CAD. Recommended cath.   Hospital Course     Consultants: Neurology  Cath showed non obstructive CAD. No intervention. Post procedure she had visual disturbance "circling and strips going down". Improved 3-5 hours post cath but still unclear visit on right side of eye on R peripherally. Symptoms different then her typical migraine. No slurred speech. Equal strength bilaterally. Neurology was consulted - This was followed by a right sided headache with right neck pain.Patient was given Solu-Medrolandnow she feels significant better with no other symptoms. She only describes a 2/10 headache and some neck pain. Description of above symptoms most consistent withvisual aura followed by migraine headache. MRI ideal, however the patient has  Non compatible PM, CT head showed: Atrophy and small vessel disease, relatively mild. No acute ntracranial findings. F/u with PCP. No change in home meds.   The patient has been seen by Dr. Meda Coffee today and deemed ready for discharge home. All follow-up appointments have been scheduled. Discharge medications are listed below.  Discharge Vitals Blood pressure (!) 143/53, pulse (!) 59, temperature 98.1 F (36.7 C), temperature source Oral, resp. rate 20, height 5\' 3"  (1.6 m), weight 161 lb 14.4 oz (73.4 kg), SpO2 95 %.  Filed Weights   10/09/17 0541 10/10/17 0414   Weight: 150 lb (68 kg) 161 lb 14.4 oz (73.4 kg)    Labs & Radiologic Studies     CBC Recent Labs    10/09/17 1555 10/10/17 0559  WBC 7.1 7.4  HGB 13.5 12.9  HCT 40.3 39.0  MCV 88.2 89.7  PLT 332 161   Basic Metabolic Panel Recent Labs    10/09/17 1554 10/10/17 0559  NA 139 142  K 3.1* 3.4*  CL 107 109  CO2 23 23  GLUCOSE 177* 154*  BUN 10 10  CREATININE 0.64 0.70  CALCIUM 8.8* 8.6*   Liver Function Tests Recent Labs    10/09/17 1554  AST 20  ALT 16  ALKPHOS 64  BILITOT 1.0  PROT 6.4*  ALBUMIN 3.8   Dg Chest 2 View  Result Date: 10/03/2017 CLINICAL DATA:  Dyspnea EXAM: CHEST - 2 VIEW COMPARISON:  08/08/2016 FINDINGS: Right-sided pacing device with similar lead position. No acute airspace disease or pleural effusion. Stable mild cardiomegaly with aortic atherosclerosis. No pneumothorax. IMPRESSION: No active cardiopulmonary disease.  Mild cardiomegaly. Electronically Signed   By: Donavan Foil M.D.   On: 10/03/2017 23:33   Ct Head Wo Contrast  Result Date: 10/09/2017 CLINICAL DATA:  Headache and visual changes. Cardiac catheterization. EXAM: CT HEAD WITHOUT CONTRAST TECHNIQUE: Contiguous axial images were obtained from the base of the skull through the vertex without intravenous contrast. COMPARISON:  None. FINDINGS: Brain: No evidence for acute infarction, hemorrhage, mass lesion, hydrocephalus, or extra-axial fluid. Mild atrophy. Slight hypoattenuation of white matter, likely small vessel disease. Vascular: Calcification of the cavernous internal carotid arteries consistent with cerebrovascular atherosclerotic disease. No signs of intracranial large vessel occlusion. Skull: Calvarium intact. Sinuses/Orbits: No sinus fluid.  Negative orbits. Other: None. IMPRESSION: Atrophy and small vessel disease, relatively mild. No acute intracranial findings. Electronically Signed   By: Staci Righter M.D.   On: 10/09/2017 20:07    Disposition   Pt is being discharged home  today in good condition.  Follow-up Plans & Appointments    Follow-up Information    Little, Lennette Bihari, MD. Schedule an appointment as soon as possible for a visit in 5 day(s).   Specialty:  Family Medicine Why:  post hospital  Contact information: [place] [address] [phone]        Deboraha Sprang, MD Follow up.   Specialty:  Cardiology Why:  office will call with time and date of appointmet  Contact information: [address] [place] [address] [phone]          Discharge Instructions    Diet - low sodium heart healthy   Complete by:  As directed    Discharge instructions   Complete by:  As directed    No driving for 72 hours. No lifting over 5 lbs for 1 week. No sexual activity for 1 week. You may return to work on 10/15/17. Keep procedure site clean & dry. If you notice increased pain, swelling, bleeding or pus, call/return!  You may shower, but no soaking baths/hot tubs/pools for 1 week.   Increase activity slowly   Complete by:  As directed       Discharge Medications   Allergies as of 10/10/2017  Reactions   Bee Venom Shortness Of Breath, Itching   Morphine And Related Shortness Of Breath, Itching   Nsaids Other (See Comments)   Upper GI bleed   Wasp Venom Shortness Of Breath, Itching   Penicillins Itching, Other (See Comments)   Has patient had a PCN reaction causing immediate rash, facial/tongue/throat swelling, SOB or lightheadedness with hypotension:No Has patient had a PCN reaction causing severe rash involving mucus membranes or skin necrosis:No Has patient had a PCN reaction that required hospitalization:No Has patient had a PCN reaction occurring within the last 10 years:No If all of the above answers are "NO", then may proceed with Cephalosporin use.   Sulfa Antibiotics Itching      Medication List    TAKE these medications   albuterol 108 (90 Base) MCG/ACT inhaler Commonly known as:   PROVENTIL HFA;VENTOLIN HFA Inhale 2 puffs into the lungs every 6 (six) hours as needed for wheezing or shortness of breath.   albuterol (2.5 MG/3ML) 0.083% nebulizer solution Commonly known as:  PROVENTIL Take 2.5 mg by nebulization every 6 (six) hours as needed for wheezing or shortness of breath.   atorvastatin 80 MG tablet Commonly known as:  LIPITOR Take 80 mg by mouth every evening.   DIHYDROERGOTAMINE MESYLATE NA Inject 2 mLs into the muscle daily as needed (for migraine headaches).   diltiazem 180 MG 24 hr capsule Commonly known as:  CARDIZEM CD Take 1 capsule (180 mg total) by mouth daily.   fenofibrate 160 MG tablet Take 160 mg by mouth every evening.   Fish Oil 1000 MG Caps Take 1,000 mg by mouth daily.   levothyroxine 100 MCG tablet Commonly known as:  SYNTHROID, LEVOTHROID Take 100 mcg by mouth daily before breakfast.   mometasone 50 MCG/ACT nasal spray Commonly known as:  NASONEX Place 2 sprays into the nose daily.   omeprazole 20 MG capsule Commonly known as:  PRILOSEC Take 20 mg by mouth daily.   tamoxifen 20 MG tablet Commonly known as:  NOLVADEX Take 1 tablet (20 mg total) by mouth daily.   triamterene-hydrochlorothiazide 75-50 MG tablet Commonly known as:  MAXZIDE Take 1 tablet by mouth every evening.   Vitamin D (Cholecalciferol) 1000 units Tabs Take 1,000 Units by mouth daily.         Outstanding Labs/Studies   N/A  Duration of Discharge Encounter   Greater than 30 minutes including physician time.  Signed, Crista Luria Sarin [place] PA-C 10/10/2017, 11:49 AM

## 2017-10-10 NOTE — Progress Notes (Signed)
CTA head and neck completed: There is atherosclerotic change of the vessels, but no flow limiting stenosis or significant irregularity. No intracranial large or medium vessel occlusion or correctable proximal stenosis.   From a neurological standpoint, given negative CTA, the patient can be discharged home with outpatient neurology follow up.  Electronically signed: Dr. Kerney Elbe

## 2017-10-12 ENCOUNTER — Encounter (HOSPITAL_COMMUNITY): Payer: Self-pay | Admitting: Interventional Cardiology

## 2017-10-12 ENCOUNTER — Telehealth: Payer: Self-pay | Admitting: Internal Medicine

## 2017-10-12 NOTE — Telephone Encounter (Signed)
New Message   Patient states that she was told over the weekend that she needs to come in and get fitted for a BP cuff. She states that she was told while in the hospital that she would begin a new heart medication. But she does not know the name. Please call to discuss.

## 2017-10-12 NOTE — Telephone Encounter (Signed)
Pt states Saturday prior to leaving MD came in her hosp room and spoke to her about starting a new medication.  Advised pt per her d/c summary she is to start Dilt 180mg  QD and this was sent to [facility].  Pt also asking about BP monitor for 1 week.  Dr. Tamala Julian wants pt to monitor BP at home herself.  Advised pt to monitor her BP 2 hrs after medication and bring those readings with her to her next appt.  Pt verbalized understanding and was in agreement with this plan.

## 2017-10-13 MED FILL — Heparin Sod (Porcine)-NaCl IV Soln 1000 Unit/500ML-0.9%: INTRAVENOUS | Qty: 1000 | Status: AC

## 2017-10-14 ENCOUNTER — Telehealth: Payer: Self-pay | Admitting: Interventional Cardiology

## 2017-10-14 MED ORDER — ATORVASTATIN CALCIUM 40 MG PO TABS: 40.0000 mg | ORAL_TABLET | Freq: Every day | ORAL | 3 refills | Status: DC

## 2017-10-14 NOTE — Telephone Encounter (Signed)
Clarification order sent over from pharmacy in regards to pt's Dilt and Atorvastatin have an interaction.  Spoke with Dr. Tamala Julian and he said to decrease Atorvastatin to 40mg  QD and continue with plan for Dilt 180mg  QD.  Spoke with pt and advised her of these recommendations.  Pt verbalized understanding and was in agreement with this plan.

## 2017-10-15 DIAGNOSIS — I10 Essential (primary) hypertension: Secondary | ICD-10-CM | POA: Diagnosis not present

## 2017-10-15 DIAGNOSIS — I5032 Chronic diastolic (congestive) heart failure: Secondary | ICD-10-CM | POA: Diagnosis not present

## 2017-10-15 DIAGNOSIS — E876 Hypokalemia: Secondary | ICD-10-CM | POA: Diagnosis not present

## 2017-10-15 DIAGNOSIS — Z8669 Personal history of other diseases of the nervous system and sense organs: Secondary | ICD-10-CM | POA: Diagnosis not present

## 2017-10-15 DIAGNOSIS — I2510 Atherosclerotic heart disease of native coronary artery without angina pectoris: Secondary | ICD-10-CM | POA: Diagnosis not present

## 2017-11-03 NOTE — Progress Notes (Signed)
Cardiology Office Note    Date:  11/04/2017   ID:  [name], [name] 03, 1947, MRN [id number]  PCP:  [name], MD  Cardiologist: [name], MD   Chief Complaint  Patient presents with  . Pacemaker Problem  . Shortness of Breath    History of Present Illness:  [name] is a 72 y.o. female with history of hyperlipidemia, essential hypertension, sick sinus syndrome and permanent pacemaker, history of breast cancer, and recent history of progressive dyspnea.  Recent concern about dyspnea led to heart catheterization.  Cardiac cath did not demonstrate any significant obstructive disease but did identify blood pressure elevation and diastolic dysfunction.  Post cath complications.  Calcium channel blocker therapy was started to decrease diastolic stiffness.  She is not tolerated the medication well with several day complaints.  Past Medical History:  Diagnosis Date  . Allergic rhinitis   . Arthritis   . Family history of breast cancer   . Family history of colon cancer   . Family history of pancreatic cancer   . GERD (gastroesophageal reflux disease)   . GI bleed   . History of basal cell cancer   . History of radiation therapy 08/05/2016 - 09/12/2016   Left Breast 50.4 Gy 28 fractions  . Hyperlipidemia   . Hypertension   . Hypothyroidism   . Lumbar disc disease   . Malignant neoplasm of upper-outer quadrant of left female breast ([facility]) 04/11/2016  . Pacemaker   . Peptic ulcer disease   . SSS (sick sinus syndrome) ([place])   . Thyroid disease     Past Surgical History:  Procedure Laterality Date  . ABDOMINAL HYSTERECTOMY    . BREAST LUMPECTOMY WITH RADIOACTIVE SEED AND SENTINEL LYMPH NODE BIOPSY Left 06/06/2016   Procedure: LEFT BREAST LUMPECTOMY WITH RADIOACTIVE SEED AND LEFT AXILLARY SENTINEL LYMPH NODE BIOPSY;  Surgeon: Alphonsa Overall, MD;  Location: [facility];  Service: General;  Laterality: Left;  . BREAST SURGERY     left  . EP IMPLANTABLE DEVICE N/A  05/15/2016   Procedure: PPM Generator Changeout;  Surgeon: Evans Lance, MD;  Location: [facility];  Service: Cardiovascular;  Laterality: N/A;  . HERNIA REPAIR    . LEFT HEART CATH AND CORONARY ANGIOGRAPHY N/A 10/09/2017   Procedure: LEFT HEART CATH AND CORONARY ANGIOGRAPHY;  Surgeon: Belva Crome, MD;  Location: [facility];  Service: Cardiovascular;  Laterality: N/A;  . PACEMAKER INSERTION      Current Medications: Outpatient Medications Prior to Visit  Medication Sig Dispense Refill  . albuterol (PROVENTIL HFA;VENTOLIN HFA) 108 (90 BASE) MCG/ACT inhaler Inhale 2 puffs into the lungs every 6 (six) hours as needed for wheezing or shortness of breath.     Marland Kitchen albuterol (PROVENTIL) (2.5 MG/3ML) 0.083% nebulizer solution Take 2.5 mg by nebulization every 6 (six) hours as needed for wheezing or shortness of breath.    Marland Kitchen DIHYDROERGOTAMINE MESYLATE NA Inject 2 mLs into the muscle daily as needed (for migraine headaches).     . fenofibrate 160 MG tablet Take 160 mg by mouth every evening.     Marland Kitchen levothyroxine (SYNTHROID, LEVOTHROID) 100 MCG tablet Take 100 mcg by mouth daily before breakfast.    . mometasone (NASONEX) 50 MCG/ACT nasal spray Place 2 sprays into the nose daily.    . Omega-3 Fatty Acids (FISH OIL) 1000 MG CAPS Take 1,000 mg by mouth daily.    Marland Kitchen omeprazole (PRILOSEC) 20 MG capsule Take 20 mg  by mouth daily.    Marland Kitchen triamterene-hydrochlorothiazide (MAXZIDE) 75-50 MG tablet Take 1 tablet by mouth every evening.     . Vitamin D, Cholecalciferol, 1000 UNITS TABS Take 1,000 Units by mouth daily.     Marland Kitchen atorvastatin (LIPITOR) 40 MG tablet Take 1 tablet (40 mg total) by mouth daily. 90 tablet 3  . diltiazem (CARDIZEM CD) 180 MG 24 hr capsule Take 1 capsule (180 mg total) by mouth daily. 30 capsule 11   No facility-administered medications prior to visit.      Allergies:   Bee venom; Morphine and related; Nsaids; Wasp venom; Penicillins; and Sulfa antibiotics   Social History     Socioeconomic History  . Marital status: Married    Spouse name: Not on file  . Number of children: 2  . Years of education: Not on file  . Highest education level: Not on file  Occupational History  . Occupation: Electrical engineer  Social Needs  . Financial resource strain: Not on file  . Food insecurity:    Worry: Not on file    Inability: Not on file  . Transportation needs:    Medical: Not on file    Non-medical: Not on file  Tobacco Use  . Smoking status: Never Smoker  . Smokeless tobacco: Never Used  Substance and Sexual Activity  . Alcohol use: No  . Drug use: No  . Sexual activity: Never    Birth control/protection: Abstinence, None  Lifestyle  . Physical activity:    Days per week: Not on file    Minutes per session: Not on file  . Stress: Not on file  Relationships  . Social connections:    Talks on phone: Not on file    Gets together: Not on file    Attends religious service: Not on file    Active member of club or organization: Not on file    Attends meetings of clubs or organizations: Not on file    Relationship status: Not on file  Other Topics Concern  . Not on file  Social History Narrative  . Not on file     Family History:  The patient's family history includes Bone cancer in her father; Bradycardia in her cousin; Breast cancer (age of onset: 8) in her cousin and cousin; Breast cancer (age of onset: 72) in her sister; Colon cancer (age of onset: 32) in her cousin; Congestive Heart Failure in her mother; Heart attack in her maternal grandmother and paternal grandmother; Hypertension in her father; Melanoma in her daughter; Pancreatic cancer (age of onset: 4) in her paternal aunt; Rectal cancer (age of onset: 20) in her paternal aunt; Stroke in her paternal grandfather.   ROS:   Please see the history of present illness.    Feels that the ears are closing since the medication was started and she has been lethargic on Cardizem. All other systems  reviewed and are negative.   PHYSICAL EXAM:   VS:  BP 128/84   Pulse 67   Ht 5\' 3"  (1.6 m)   Wt 155 lb (70.3 kg)   BMI 27.46 kg/m    GEN: Well nourished, well developed, in no acute distress  HEENT: normal  Neck: no JVD, carotid bruits, or masses Cardiac: RRR; no murmurs, rubs, or gallops,no edema  Respiratory:  clear to auscultation bilaterally, normal work of breathing GI: soft, nontender, nondistended, + BS MS: no deformity or atrophy  Skin: warm and dry, no rash Neuro:  Alert and Oriented x  3, Strength and sensation are intact Psych: euthymic mood, full affect  Wt Readings from Last 3 Encounters:  11/04/17 155 lb (70.3 kg)  10/10/17 161 lb 14.4 oz (73.4 kg)  09/28/17 161 lb (73 kg)      Studies/Labs Reviewed:   EKG:  EKG  Not repeated  Recent Labs: 10/09/2017: ALT 16 10/10/2017: BUN 10; Creatinine, Ser 0.70; Hemoglobin 12.9; Platelets 310; Potassium 3.4; Sodium 142   Lipid Panel No results found for: CHOL, TRIG, HDL, CHOLHDL, VLDL, LDLCALC, LDLDIRECT  Additional studies/ records that were reviewed today include:  Cardiac catheterization 10/09/2017: Conclusion    30 to 40% ostial left main.  There is ostial calcification noted as well.  LAD wraps around the left ventricular apex.  It contains luminal irregularities from the mid to distal vessel obstructing up to 30%.  No high-grade focal stenosis is noted.  Circumflex is dominant.  The PDA contains diffuse 60 to 70% proximal narrowing.  Irregularities are noted in the mid vessel.  Nondominant right coronary without obstructive disease.  Normal left ventricular systolic function with EF 55 to 60%.  Elevated LVEDP of 21 millimeters mercury.  Findings are compatible with chronic diastolic heart failure.  RECOMMENDATIONS:   Aggressive risk factor modification including lipid-lowering LDL less than 70.  High blood pressure control 130/80 mmHg or less.  Adjust medications to lower LVEDP.  May need to add low-dose  diuretic therapy.      ASSESSMENT:    1. SSS (sick sinus syndrome) ([facility])   2. Pacemaker   3. Essential hypertension   4. Dyspnea on exertion   5. Coronary artery disease involving native coronary artery of native heart without angina pectoris      PLAN:  In order of problems listed above:  1. Normal pacemaker function when last evaluated.  No symptoms to suggest tachyarrhythmia. 2. Normal function when last evaluated 3. Adequate blood pressure control at today's office visit.  Blood pressure target is 130/80 mmHg.  She is on Maxide.  Diltiazem 180 mg/day was added.  She has complaints.  Discontinue diltiazem.  Monitor blood pressure.  May need further medication adjustment to keep blood pressure 130/80 mmHg.  Low-salt diet and aerobic activity encouraged currently. 4. A component of chronic diastolic heart failure is present.  No obvious volume overload is noted. 5. Moderate nonobstructive coronary disease.  Risk factor modification to prevent progression.  Monitor blood pressure, progressive dyspnea on exertion will need more aggressive diuretic regimen, may reinstitute a negative inotrope such as beta-blocker or verapamil at the future if needed for blood pressure control.  1 year follow-up with me.  Extended office visit related to education and counseling concerning recent catheterization, blood pressure, and diastolic heart failure.  50% of the time spent doing this greater than 25-minute office visit was spent in counseling.  Medication Adjustments/Labs and Tests Ordered: Current medicines are reviewed at length with the patient today.  Concerns regarding medicines are outlined above.  Medication changes, Labs and Tests ordered today are listed in the Patient Instructions below. Patient Instructions  Medication Instructions:  1) DISCONTINUE Diltiazem 2) INCREASE Atorvastatin to 80mg  once daily  Labwork: You need to have a Lipid and liver panel drawn in about a month.  This  can be done at our office or your PCP office.   Testing/Procedures: None  Follow-Up: Your physician wants you to follow-up in: 1 year with Dr. Tamala Julian.  You will receive a reminder letter in the mail two months in advance. If you  don't receive a letter, please call our office to schedule the follow-up appointment.   Any Other Special Instructions Will Be Listed Below (If Applicable).     If you need a refill on your cardiac medications before your next appointment, please call your pharmacy.      Signed, [name], MD  11/04/2017 1:09 PM    [facility] [place], [place],   [address] Phone: [phone]; Fax: [phone]

## 2017-11-04 ENCOUNTER — Ambulatory Visit (INDEPENDENT_AMBULATORY_CARE_PROVIDER_SITE_OTHER): Payer: Medicare HMO | Admitting: Interventional Cardiology

## 2017-11-04 ENCOUNTER — Encounter: Payer: Self-pay | Admitting: Interventional Cardiology

## 2017-11-04 VITALS — BP 128/84 | HR 67 | Ht 63.0 in | Wt 155.0 lb

## 2017-11-04 DIAGNOSIS — R0609 Other forms of dyspnea: Secondary | ICD-10-CM | POA: Diagnosis not present

## 2017-11-04 DIAGNOSIS — I10 Essential (primary) hypertension: Secondary | ICD-10-CM

## 2017-11-04 DIAGNOSIS — I495 Sick sinus syndrome: Secondary | ICD-10-CM

## 2017-11-04 DIAGNOSIS — I2510 Atherosclerotic heart disease of native coronary artery without angina pectoris: Secondary | ICD-10-CM | POA: Diagnosis not present

## 2017-11-04 DIAGNOSIS — I5020 Unspecified systolic (congestive) heart failure: Secondary | ICD-10-CM | POA: Insufficient documentation

## 2017-11-04 DIAGNOSIS — R0600 Dyspnea, unspecified: Secondary | ICD-10-CM

## 2017-11-04 DIAGNOSIS — Z950 Presence of cardiac pacemaker: Secondary | ICD-10-CM | POA: Diagnosis not present

## 2017-11-04 DIAGNOSIS — I5032 Chronic diastolic (congestive) heart failure: Secondary | ICD-10-CM | POA: Diagnosis not present

## 2017-11-04 MED ORDER — ATORVASTATIN CALCIUM 80 MG PO TABS: 80.0000 mg | ORAL_TABLET | Freq: Every day | ORAL | 3 refills | Status: DC

## 2017-11-04 NOTE — Patient Instructions (Addendum)
Medication Instructions:  1) DISCONTINUE Diltiazem 2) INCREASE Atorvastatin to 80mg  once daily  Labwork: You need to have a Lipid and liver panel drawn in about a month.  This can be done at our office or your PCP office.   Testing/Procedures: None  Follow-Up: Your physician wants you to follow-up in: 1 year with Dr. Tamala Julian.  You will receive a reminder letter in the mail two months in advance. If you don't receive a letter, please call our office to schedule the follow-up appointment.   Any Other Special Instructions Will Be Listed Below (If Applicable).     If you need a refill on your cardiac medications before your next appointment, please call your pharmacy.

## 2017-11-17 ENCOUNTER — Encounter (HOSPITAL_BASED_OUTPATIENT_CLINIC_OR_DEPARTMENT_OTHER): Payer: Medicare HMO

## 2017-11-20 ENCOUNTER — Encounter (HOSPITAL_BASED_OUTPATIENT_CLINIC_OR_DEPARTMENT_OTHER): Payer: Medicare HMO

## 2017-11-20 DIAGNOSIS — L509 Urticaria, unspecified: Secondary | ICD-10-CM | POA: Diagnosis not present

## 2017-11-24 ENCOUNTER — Telehealth: Payer: Self-pay

## 2017-11-24 ENCOUNTER — Encounter: Payer: Medicare HMO | Admitting: *Deleted

## 2017-11-24 NOTE — Telephone Encounter (Signed)
LMOVM reminding pt to send remote transmission.   

## 2017-11-25 ENCOUNTER — Encounter: Payer: Self-pay | Admitting: Cardiology

## 2017-11-26 ENCOUNTER — Encounter: Payer: Medicare HMO | Admitting: Internal Medicine

## 2017-12-18 ENCOUNTER — Encounter (HOSPITAL_BASED_OUTPATIENT_CLINIC_OR_DEPARTMENT_OTHER): Payer: Medicare HMO

## 2017-12-24 ENCOUNTER — Other Ambulatory Visit: Payer: Self-pay | Admitting: Dermatology

## 2017-12-24 DIAGNOSIS — L814 Other melanin hyperpigmentation: Secondary | ICD-10-CM | POA: Diagnosis not present

## 2017-12-24 DIAGNOSIS — D0462 Carcinoma in situ of skin of left upper limb, including shoulder: Secondary | ICD-10-CM | POA: Diagnosis not present

## 2017-12-24 DIAGNOSIS — L309 Dermatitis, unspecified: Secondary | ICD-10-CM | POA: Diagnosis not present

## 2017-12-24 DIAGNOSIS — L509 Urticaria, unspecified: Secondary | ICD-10-CM | POA: Diagnosis not present

## 2017-12-24 DIAGNOSIS — D485 Neoplasm of uncertain behavior of skin: Secondary | ICD-10-CM | POA: Diagnosis not present

## 2017-12-25 ENCOUNTER — Encounter: Payer: Self-pay | Admitting: Cardiology

## 2018-01-08 ENCOUNTER — Ambulatory Visit (INDEPENDENT_AMBULATORY_CARE_PROVIDER_SITE_OTHER): Payer: Medicare HMO | Admitting: *Deleted

## 2018-01-08 DIAGNOSIS — I495 Sick sinus syndrome: Secondary | ICD-10-CM

## 2018-01-11 DIAGNOSIS — Z5181 Encounter for therapeutic drug level monitoring: Secondary | ICD-10-CM | POA: Diagnosis not present

## 2018-01-11 DIAGNOSIS — L309 Dermatitis, unspecified: Secondary | ICD-10-CM | POA: Diagnosis not present

## 2018-01-11 DIAGNOSIS — L209 Atopic dermatitis, unspecified: Secondary | ICD-10-CM | POA: Diagnosis not present

## 2018-01-11 DIAGNOSIS — Z79899 Other long term (current) drug therapy: Secondary | ICD-10-CM | POA: Diagnosis not present

## 2018-01-11 NOTE — Progress Notes (Signed)
Remote pacemaker transmission.   

## 2018-01-14 ENCOUNTER — Telehealth: Payer: Self-pay | Admitting: Interventional Cardiology

## 2018-01-14 DIAGNOSIS — I2510 Atherosclerotic heart disease of native coronary artery without angina pectoris: Secondary | ICD-10-CM

## 2018-01-14 DIAGNOSIS — E782 Mixed hyperlipidemia: Secondary | ICD-10-CM

## 2018-01-14 NOTE — Telephone Encounter (Signed)
Spoke with pt and scheduled her to come for fasting labs per Dr. Tamala Julian on 8/16.  Pt verbalized understanding and was in agreement with this plan.

## 2018-01-15 ENCOUNTER — Other Ambulatory Visit: Payer: Medicare HMO

## 2018-01-27 LAB — CUP PACEART REMOTE DEVICE CHECK
Battery Impedance: 159 Ohm
Battery Voltage: 2.78 V
Brady Statistic AP VP Percent: 1 %
Brady Statistic AP VS Percent: 76 %
Brady Statistic AS VP Percent: 1 %
Implantable Lead Implant Date: 19970926
Implantable Lead Location: 753859
Implantable Lead Model: 4024
Implantable Lead Model: 4524
Lead Channel Impedance Value: 330 Ohm
Lead Channel Impedance Value: 909 Ohm
Lead Channel Pacing Threshold Amplitude: 1.375 V
Lead Channel Pacing Threshold Pulse Width: 0.4 ms
Lead Channel Pacing Threshold Pulse Width: 0.4 ms
Lead Channel Setting Pacing Amplitude: 2.5 V
Lead Channel Setting Pacing Pulse Width: 0.4 ms
MDC IDC LEAD IMPLANT DT: 19970926
MDC IDC LEAD LOCATION: 753860
MDC IDC MSMT BATTERY REMAINING LONGEVITY: 108 mo
MDC IDC MSMT LEADCHNL RV PACING THRESHOLD AMPLITUDE: 0.875 V
MDC IDC PG IMPLANT DT: 20171214
MDC IDC SESS DTM: 20190809194200
MDC IDC SET LEADCHNL RA PACING AMPLITUDE: 2.75 V
MDC IDC SET LEADCHNL RV SENSING SENSITIVITY: 4 mV
MDC IDC STAT BRADY AS VS PERCENT: 22 %

## 2018-01-28 DIAGNOSIS — I10 Essential (primary) hypertension: Secondary | ICD-10-CM | POA: Diagnosis not present

## 2018-01-29 DIAGNOSIS — E782 Mixed hyperlipidemia: Secondary | ICD-10-CM | POA: Diagnosis not present

## 2018-01-29 DIAGNOSIS — Z1389 Encounter for screening for other disorder: Secondary | ICD-10-CM | POA: Diagnosis not present

## 2018-01-29 DIAGNOSIS — M8580 Other specified disorders of bone density and structure, unspecified site: Secondary | ICD-10-CM | POA: Diagnosis not present

## 2018-01-29 DIAGNOSIS — I5032 Chronic diastolic (congestive) heart failure: Secondary | ICD-10-CM | POA: Diagnosis not present

## 2018-01-29 DIAGNOSIS — I10 Essential (primary) hypertension: Secondary | ICD-10-CM | POA: Diagnosis not present

## 2018-01-29 DIAGNOSIS — Z950 Presence of cardiac pacemaker: Secondary | ICD-10-CM | POA: Diagnosis not present

## 2018-01-29 DIAGNOSIS — I2510 Atherosclerotic heart disease of native coronary artery without angina pectoris: Secondary | ICD-10-CM | POA: Diagnosis not present

## 2018-01-29 DIAGNOSIS — Z853 Personal history of malignant neoplasm of breast: Secondary | ICD-10-CM | POA: Diagnosis not present

## 2018-01-29 DIAGNOSIS — Z0000 Encounter for general adult medical examination without abnormal findings: Secondary | ICD-10-CM | POA: Diagnosis not present

## 2018-01-29 DIAGNOSIS — E039 Hypothyroidism, unspecified: Secondary | ICD-10-CM | POA: Diagnosis not present

## 2018-01-29 DIAGNOSIS — Z8719 Personal history of other diseases of the digestive system: Secondary | ICD-10-CM | POA: Diagnosis not present

## 2018-02-10 ENCOUNTER — Encounter: Payer: Medicare HMO | Admitting: Internal Medicine

## 2018-03-10 DIAGNOSIS — Z5181 Encounter for therapeutic drug level monitoring: Secondary | ICD-10-CM | POA: Diagnosis not present

## 2018-03-19 ENCOUNTER — Encounter: Payer: Self-pay | Admitting: Oncology

## 2018-03-19 DIAGNOSIS — R928 Other abnormal and inconclusive findings on diagnostic imaging of breast: Secondary | ICD-10-CM | POA: Diagnosis not present

## 2018-03-19 DIAGNOSIS — R2232 Localized swelling, mass and lump, left upper limb: Secondary | ICD-10-CM | POA: Diagnosis not present

## 2018-03-19 DIAGNOSIS — Z853 Personal history of malignant neoplasm of breast: Secondary | ICD-10-CM | POA: Diagnosis not present

## 2018-04-12 ENCOUNTER — Telehealth: Payer: Self-pay | Admitting: Cardiology

## 2018-04-12 ENCOUNTER — Encounter: Payer: Medicare HMO | Admitting: *Deleted

## 2018-04-12 NOTE — Telephone Encounter (Signed)
LMOVM reminding pt to send remote transmission.   

## 2018-04-14 ENCOUNTER — Encounter: Payer: Self-pay | Admitting: Cardiology

## 2018-04-15 ENCOUNTER — Ambulatory Visit (INDEPENDENT_AMBULATORY_CARE_PROVIDER_SITE_OTHER): Payer: Medicare HMO | Admitting: *Deleted

## 2018-04-15 ENCOUNTER — Ambulatory Visit (INDEPENDENT_AMBULATORY_CARE_PROVIDER_SITE_OTHER): Payer: Medicare HMO | Admitting: Internal Medicine

## 2018-04-15 ENCOUNTER — Encounter: Payer: Self-pay | Admitting: Internal Medicine

## 2018-04-15 VITALS — BP 128/88 | HR 65 | Ht 63.0 in | Wt 159.0 lb

## 2018-04-15 DIAGNOSIS — J45909 Unspecified asthma, uncomplicated: Secondary | ICD-10-CM | POA: Diagnosis not present

## 2018-04-15 DIAGNOSIS — I495 Sick sinus syndrome: Secondary | ICD-10-CM

## 2018-04-15 DIAGNOSIS — Z950 Presence of cardiac pacemaker: Secondary | ICD-10-CM | POA: Diagnosis not present

## 2018-04-15 DIAGNOSIS — E785 Hyperlipidemia, unspecified: Secondary | ICD-10-CM

## 2018-04-15 DIAGNOSIS — I5032 Chronic diastolic (congestive) heart failure: Secondary | ICD-10-CM | POA: Diagnosis not present

## 2018-04-15 NOTE — Progress Notes (Signed)
Patient Care Team: [name], MD as PCP - General (Family Medicine) Magrinat, Virgie Dad, MD as Consulting Physician (Oncology) Gery Pray, MD as Consulting Physician (Radiation Oncology) Alphonsa Overall, MD as Consulting Physician (General Surgery) Teena Irani, MD (Inactive) as Consulting Physician (Gastroenterology) Delice Bison Charlestine Massed, NP as Nurse Practitioner (Hematology and Oncology) Deboraha Sprang, MD as Consulting Physician (Cardiology)   HPI  [name] is a 72 y.o. female Is seen in follow-up for a pacemaker implanted previously on the left side and then moved to the right side to get out of the way of radiation therapy. 12/17  Device was originally implanted about 15+ years ago and she underwent generator replacement in 2008.   DATE TEST EF   8/16 Myoview    No ischemia  4/19 Echo   65 %   5/19 LHC 55-60% LM 30-40; PDA 60-70d   Date Cr K LDL  8/19 0.69 4.5 87          Her dyspnea has been much better.  She does have a history of wheezing and is getting worse.  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     Past Medical History:  Diagnosis Date  . Allergic rhinitis   . Arthritis   . Family history of breast cancer   . Family history of colon cancer   . Family history of pancreatic cancer   . GERD (gastroesophageal reflux disease)   . GI bleed   . History of basal cell cancer   . History of radiation therapy 08/05/2016 - 09/12/2016   Left Breast 50.4 Gy 28 fractions  . Hyperlipidemia   . Hypertension   . Hypothyroidism   . Lumbar disc disease   . Malignant neoplasm of upper-outer quadrant of left female breast ([place]) 04/11/2016  . Pacemaker   . Peptic ulcer disease   . SSS (sick sinus syndrome) ([place])   . Thyroid disease     Past Surgical History:  Procedure Laterality Date  . ABDOMINAL HYSTERECTOMY    . BREAST LUMPECTOMY WITH RADIOACTIVE SEED AND SENTINEL LYMPH NODE BIOPSY Left 06/06/2016   Procedure: LEFT BREAST LUMPECTOMY WITH  RADIOACTIVE SEED AND LEFT AXILLARY SENTINEL LYMPH NODE BIOPSY;  Surgeon: Alphonsa Overall, MD;  Location: [facility];  Service: General;  Laterality: Left;  . BREAST SURGERY     left  . EP IMPLANTABLE DEVICE N/A 05/15/2016   Procedure: PPM Generator Changeout;  Surgeon: Evans Lance, MD;  Location: [facility];  Service: Cardiovascular;  Laterality: N/A;  . HERNIA REPAIR    . LEFT HEART CATH AND CORONARY ANGIOGRAPHY N/A 10/09/2017   Procedure: LEFT HEART CATH AND CORONARY ANGIOGRAPHY;  Surgeon: Belva Crome, MD;  Location: [facility];  Service: Cardiovascular;  Laterality: N/A;  . PACEMAKER INSERTION      Current Outpatient Medications  Medication Sig Dispense Refill  . albuterol (PROVENTIL HFA;VENTOLIN HFA) 108 (90 BASE) MCG/ACT inhaler Inhale 2 puffs into the lungs every 6 (six) hours as needed for wheezing or shortness of breath.     Marland Kitchen albuterol (PROVENTIL) (2.5 MG/3ML) 0.083% nebulizer solution Take 2.5 mg by nebulization every 6 (six) hours as needed for wheezing or shortness of breath.    Marland Kitchen atorvastatin (LIPITOR) 80 MG tablet Take 1 tablet (80 mg total) by mouth daily. 90 tablet 3  . DIHYDROERGOTAMINE MESYLATE NA Inject 2 mLs into the muscle daily as needed (for migraine headaches).     Marland Kitchen levothyroxine (SYNTHROID,  LEVOTHROID) 100 MCG tablet Take 100 mcg by mouth daily before breakfast.    . mometasone (NASONEX) 50 MCG/ACT nasal spray Place 2 sprays into the nose daily.    . Omega-3 Fatty Acids (FISH OIL) 1000 MG CAPS Take 1,000 mg by mouth daily.    Marland Kitchen omeprazole (PRILOSEC) 20 MG capsule Take 20 mg by mouth daily.    Marland Kitchen triamterene-hydrochlorothiazide (MAXZIDE) 75-50 MG tablet Take 1 tablet by mouth every evening.      No current facility-administered medications for this visit.     Allergies  Allergen Reactions  . Bee Venom Shortness Of Breath and Itching  . Morphine And Related Shortness Of Breath and Itching  . Nsaids Other (See Comments)    Upper GI bleed  . Wasp  Venom Shortness Of Breath and Itching  . Penicillins Itching and Other (See Comments)    Has patient had a PCN reaction causing immediate rash, facial/tongue/throat swelling, SOB or lightheadedness with hypotension:No Has patient had a PCN reaction causing severe rash involving mucus membranes or skin necrosis:No Has patient had a PCN reaction that required hospitalization:No Has patient had a PCN reaction occurring within the last 10 years:No If all of the above answers are "NO", then may proceed with Cephalosporin use.   . Sulfa Antibiotics Itching      Review of Systems negative except from HPI and PMH  Physical Exam BP 128/88   Pulse 65   Ht 5\' 3"  (1.6 m)   Wt 159 lb (72.1 kg)   SpO2 98%   BMI 28.17 kg/m  Well developed and nourished in no acute distress HENT normal Neck supple with JVP-flat Clear Regular rate and rhythm, no murmurs or gallops Abd-soft with active BS No Clubbing cyanosis edema Skin-warm and dry A & Oriented  Grossly normal sensory and motor function     ECG A paced @ 65 18/14/48 Rightward axis IVCD   Assessment and  Plan  Sinus node dysfunction  Breast Cancer   Wheezing  Dyspnea on exertion/Chest discomfort  Pacemaker L sided translocated to R for  XRT     Patient is wheezing today.  She uses inhaler some.  Forced expiration was significantly prolonged.  Does not see pulmonary.  She is open to referral.  Device function is normal.  Risk factor modification is undergoing for her coronary disease.  She is tolerating statins.  We spent more than 50% of our >25 min visit in face to face counseling regarding the above

## 2018-04-15 NOTE — Progress Notes (Signed)
Remote pacemaker transmission.   

## 2018-04-15 NOTE — Patient Instructions (Addendum)
Medication Instructions:  Your physician recommends that you continue on your current medications as directed. Please refer to the Current Medication list given to you today.  Labwork: None ordered.  Testing/Procedures: None ordered.  Follow-Up: Your physician recommends that you schedule a follow-up appointment in:   We have sent in a referral to see a pulmonology for your asthma.  One Year with Dr Caryl Comes  Remote monitoring is used to monitor your Pacemaker from home. This monitoring reduces the number of office visits required to check your device to one time per year. It allows [place] to keep an eye on the functioning of your device to ensure it is working properly. You are scheduled for a device check from home on 2/13. You may send your transmission at any time that day. If you have a wireless device, the transmission will be sent automatically. After your physician reviews your transmission, you will receive a postcard with your next transmission date.    Any Other Special Instructions Will Be Listed Below (If Applicable).     If you need a refill on your cardiac medications before your next appointment, please call your pharmacy.

## 2018-04-16 LAB — CUP PACEART INCLINIC DEVICE CHECK
Battery Impedance: 135 Ohm
Brady Statistic AP VP Percent: 1 %
Brady Statistic AP VS Percent: 76 %
Brady Statistic AS VS Percent: 22 %
Date Time Interrogation Session: 20191114223540
Implantable Lead Implant Date: 19970926
Implantable Lead Location: 753859
Implantable Lead Model: 4024
Lead Channel Impedance Value: 880 Ohm
Lead Channel Pacing Threshold Pulse Width: 0.4 ms
Lead Channel Pacing Threshold Pulse Width: 0.64 ms
Lead Channel Sensing Intrinsic Amplitude: 11.2 mV
Lead Channel Sensing Intrinsic Amplitude: 2 mV
Lead Channel Setting Pacing Amplitude: 2.5 V
Lead Channel Setting Sensing Sensitivity: 4 mV
MDC IDC LEAD IMPLANT DT: 19970926
MDC IDC LEAD LOCATION: 753860
MDC IDC MSMT BATTERY REMAINING LONGEVITY: 110 mo
MDC IDC MSMT BATTERY VOLTAGE: 2.78 V
MDC IDC MSMT LEADCHNL RA IMPEDANCE VALUE: 335 Ohm
MDC IDC MSMT LEADCHNL RA PACING THRESHOLD AMPLITUDE: 1.25 V
MDC IDC MSMT LEADCHNL RV PACING THRESHOLD AMPLITUDE: 1 V
MDC IDC PG IMPLANT DT: 20171214
MDC IDC SET LEADCHNL RV PACING AMPLITUDE: 2.5 V
MDC IDC SET LEADCHNL RV PACING PULSEWIDTH: 0.4 ms
MDC IDC STAT BRADY AS VP PERCENT: 1 %

## 2018-04-23 ENCOUNTER — Other Ambulatory Visit: Payer: Medicare HMO

## 2018-06-15 LAB — CUP PACEART REMOTE DEVICE CHECK
Battery Impedance: 135 Ohm
Brady Statistic AP VP Percent: 1 %
Brady Statistic AP VS Percent: 76 %
Brady Statistic AS VP Percent: 1 %
Brady Statistic AS VS Percent: 22 %
Implantable Lead Implant Date: 19970926
Implantable Lead Location: 753860
Implantable Lead Model: 4024
Implantable Lead Model: 4524
Implantable Pulse Generator Implant Date: 20171214
Lead Channel Impedance Value: 330 Ohm
Lead Channel Impedance Value: 939 Ohm
Lead Channel Pacing Threshold Amplitude: 1.5 V
Lead Channel Pacing Threshold Pulse Width: 0.4 ms
Lead Channel Setting Pacing Amplitude: 2.5 V
MDC IDC LEAD IMPLANT DT: 19970926
MDC IDC LEAD LOCATION: 753859
MDC IDC MSMT BATTERY REMAINING LONGEVITY: 110 mo
MDC IDC MSMT BATTERY VOLTAGE: 2.78 V
MDC IDC MSMT LEADCHNL RV PACING THRESHOLD AMPLITUDE: 0.875 V
MDC IDC MSMT LEADCHNL RV PACING THRESHOLD PULSEWIDTH: 0.4 ms
MDC IDC SESS DTM: 20191114175845
MDC IDC SET LEADCHNL RA PACING AMPLITUDE: 3 V
MDC IDC SET LEADCHNL RV PACING PULSEWIDTH: 0.4 ms
MDC IDC SET LEADCHNL RV SENSING SENSITIVITY: 4 mV

## 2018-07-02 ENCOUNTER — Institutional Professional Consult (permissible substitution): Payer: Medicare HMO | Admitting: Internal Medicine

## 2018-07-09 ENCOUNTER — Institutional Professional Consult (permissible substitution): Payer: Medicare HMO | Admitting: Internal Medicine

## 2018-07-15 ENCOUNTER — Ambulatory Visit (INDEPENDENT_AMBULATORY_CARE_PROVIDER_SITE_OTHER): Payer: Medicare HMO

## 2018-07-15 DIAGNOSIS — I495 Sick sinus syndrome: Secondary | ICD-10-CM

## 2018-07-16 ENCOUNTER — Telehealth: Payer: Self-pay

## 2018-07-16 NOTE — Telephone Encounter (Signed)
Spoke with patient to remind of missed remote transmission 

## 2018-07-17 LAB — CUP PACEART REMOTE DEVICE CHECK
Battery Impedance: 159 Ohm
Battery Remaining Longevity: 110 mo
Battery Voltage: 2.78 V
Brady Statistic AP VP Percent: 3 %
Brady Statistic AP VS Percent: 80 %
Brady Statistic AS VS Percent: 15 %
Date Time Interrogation Session: 20200214185315
Implantable Lead Implant Date: 19970926
Implantable Lead Implant Date: 19970926
Implantable Lead Location: 753859
Implantable Lead Location: 753860
Implantable Lead Model: 4024
Implantable Lead Model: 4524
Implantable Pulse Generator Implant Date: 20171214
Lead Channel Impedance Value: 334 Ohm
Lead Channel Impedance Value: 931 Ohm
Lead Channel Pacing Threshold Amplitude: 0.875 V
Lead Channel Pacing Threshold Amplitude: 1.5 V
Lead Channel Pacing Threshold Pulse Width: 0.4 ms
Lead Channel Pacing Threshold Pulse Width: 0.4 ms
Lead Channel Setting Pacing Amplitude: 2.5 V
Lead Channel Setting Pacing Amplitude: 2.5 V
Lead Channel Setting Pacing Pulse Width: 0.4 ms
Lead Channel Setting Sensing Sensitivity: 4 mV
MDC IDC STAT BRADY AS VP PERCENT: 2 %

## 2018-07-27 NOTE — Progress Notes (Signed)
Remote pacemaker transmission.   

## 2018-09-09 ENCOUNTER — Other Ambulatory Visit: Payer: Medicare HMO

## 2018-09-09 ENCOUNTER — Ambulatory Visit: Payer: Medicare HMO | Admitting: Oncology

## 2018-09-16 DIAGNOSIS — J0100 Acute maxillary sinusitis, unspecified: Secondary | ICD-10-CM | POA: Diagnosis not present

## 2018-10-14 ENCOUNTER — Other Ambulatory Visit: Payer: Self-pay

## 2018-10-14 ENCOUNTER — Encounter: Payer: Medicare HMO | Admitting: *Deleted

## 2018-10-15 ENCOUNTER — Telehealth: Payer: Self-pay

## 2018-10-15 NOTE — Telephone Encounter (Signed)
Left message for patient to remind of missed remote transmission.  

## 2018-10-21 ENCOUNTER — Encounter: Payer: Self-pay | Admitting: Cardiology

## 2018-11-22 ENCOUNTER — Other Ambulatory Visit: Payer: Self-pay | Admitting: Oncology

## 2018-11-30 DIAGNOSIS — C50919 Malignant neoplasm of unspecified site of unspecified female breast: Secondary | ICD-10-CM | POA: Diagnosis not present

## 2018-11-30 DIAGNOSIS — J4520 Mild intermittent asthma, uncomplicated: Secondary | ICD-10-CM | POA: Diagnosis not present

## 2018-11-30 DIAGNOSIS — E782 Mixed hyperlipidemia: Secondary | ICD-10-CM | POA: Diagnosis not present

## 2018-11-30 DIAGNOSIS — I5032 Chronic diastolic (congestive) heart failure: Secondary | ICD-10-CM | POA: Diagnosis not present

## 2018-11-30 DIAGNOSIS — I10 Essential (primary) hypertension: Secondary | ICD-10-CM | POA: Diagnosis not present

## 2018-11-30 DIAGNOSIS — I2510 Atherosclerotic heart disease of native coronary artery without angina pectoris: Secondary | ICD-10-CM | POA: Diagnosis not present

## 2018-11-30 DIAGNOSIS — M8580 Other specified disorders of bone density and structure, unspecified site: Secondary | ICD-10-CM | POA: Diagnosis not present

## 2018-11-30 DIAGNOSIS — E039 Hypothyroidism, unspecified: Secondary | ICD-10-CM | POA: Diagnosis not present

## 2018-11-30 DIAGNOSIS — Z853 Personal history of malignant neoplasm of breast: Secondary | ICD-10-CM | POA: Diagnosis not present

## 2018-12-07 NOTE — Progress Notes (Signed)
Cardiology Office Note:    Date:  12/08/2018   ID:  [name], DOB [birth year], MRN [id number]  PCP:  [name], MD  Cardiologist:  No primary care provider on file.   Referring MD: [name], MD   Chief Complaint  Patient presents with  . Congestive Heart Failure  . Hypertension  . Pacemaker Problem    History of Present Illness:    [name] is a 73 y.o. female with a hx of  hyperlipidemia, chronic diastolic HF, non obstructive CAD, essential hypertension, sick sinus syndrome and permanent pacemaker, history of breast cancer, and recent history of progressive dyspnea.  No complaints.  Specifically denies angina, dyspnea on exertion, orthopnea, edema, palpitations, and syncope.  No medication side effects.  Past Medical History:  Diagnosis Date  . Allergic rhinitis   . Arthritis   . Family history of breast cancer   . Family history of colon cancer   . Family history of pancreatic cancer   . GERD (gastroesophageal reflux disease)   . GI bleed   . History of basal cell cancer   . History of radiation therapy 08/05/2016 - 09/12/2016   Left Breast 50.4 Gy 28 fractions  . Hyperlipidemia   . Hypertension   . Hypothyroidism   . Lumbar disc disease   . Malignant neoplasm of upper-outer quadrant of left female breast ([place]) 04/11/2016  . Pacemaker   . Peptic ulcer disease   . SSS (sick sinus syndrome) ([place])   . Thyroid disease     Past Surgical History:  Procedure Laterality Date  . ABDOMINAL HYSTERECTOMY    . BREAST LUMPECTOMY WITH RADIOACTIVE SEED AND SENTINEL LYMPH NODE BIOPSY Left 06/06/2016   Procedure: LEFT BREAST LUMPECTOMY WITH RADIOACTIVE SEED AND LEFT AXILLARY SENTINEL LYMPH NODE BIOPSY;  Surgeon: Alphonsa Overall, MD;  Location: [facility];  Service: General;  Laterality: Left;  . BREAST SURGERY     left  . EP IMPLANTABLE DEVICE N/A 05/15/2016   Procedure: PPM Generator Changeout;  Surgeon: Evans Lance, MD;  Location: [facility];  Service:  Cardiovascular;  Laterality: N/A;  . HERNIA REPAIR    . LEFT HEART CATH AND CORONARY ANGIOGRAPHY N/A 10/09/2017   Procedure: LEFT HEART CATH AND CORONARY ANGIOGRAPHY;  Surgeon: Belva Crome, MD;  Location: [facility];  Service: Cardiovascular;  Laterality: N/A;  . PACEMAKER INSERTION      Current Medications: Current Meds  Medication Sig  . albuterol (PROVENTIL HFA;VENTOLIN HFA) 108 (90 BASE) MCG/ACT inhaler Inhale 2 puffs into the lungs every 6 (six) hours as needed for wheezing or shortness of breath.   Marland Kitchen albuterol (PROVENTIL) (2.5 MG/3ML) 0.083% nebulizer solution Take 2.5 mg by nebulization every 6 (six) hours as needed for wheezing or shortness of breath.  Marland Kitchen atorvastatin (LIPITOR) 80 MG tablet Take 1 tablet (80 mg total) by mouth daily.  Marland Kitchen levothyroxine (SYNTHROID, LEVOTHROID) 100 MCG tablet Take 100 mcg by mouth daily before breakfast.  . mometasone (NASONEX) 50 MCG/ACT nasal spray Place 2 sprays into the nose daily.  . Omega-3 Fatty Acids (FISH OIL) 1000 MG CAPS Take 1,000 mg by mouth daily.  Marland Kitchen omeprazole (PRILOSEC) 20 MG capsule Take 20 mg by mouth daily.  . tamoxifen (NOLVADEX) 20 MG tablet Take 1 tablet by mouth once daily  . triamterene-hydrochlorothiazide (MAXZIDE) 75-50 MG tablet Take 1 tablet by mouth every evening.      Allergies:   Bee venom, Morphine and related, Nsaids, Wasp venom, Penicillins, and  Sulfa antibiotics   Social History   Socioeconomic History  . Marital status: Married    Spouse name: Not on file  . Number of children: 2  . Years of education: Not on file  . Highest education level: Not on file  Occupational History  . Occupation: Electrical engineer  Social Needs  . Financial resource strain: Not on file  . Food insecurity    Worry: Not on file    Inability: Not on file  . Transportation needs    Medical: Not on file    Non-medical: Not on file  Tobacco Use  . Smoking status: Never Smoker  . Smokeless tobacco: Never Used  Substance and  Sexual Activity  . Alcohol use: No  . Drug use: No  . Sexual activity: Never    Birth control/protection: Abstinence, None  Lifestyle  . Physical activity    Days per week: Not on file    Minutes per session: Not on file  . Stress: Not on file  Relationships  . Social Herbalist on phone: Not on file    Gets together: Not on file    Attends religious service: Not on file    Active member of club or organization: Not on file    Attends meetings of clubs or organizations: Not on file    Relationship status: Not on file  Other Topics Concern  . Not on file  Social History Narrative  . Not on file     Family History: The patient's family history includes Bone cancer in her father; Bradycardia in her cousin; Breast cancer (age of onset: 80) in her cousin and cousin; Breast cancer (age of onset: 21) in her sister; Colon cancer (age of onset: 84) in her cousin; Congestive Heart Failure in her mother; Heart attack in her maternal grandmother and paternal grandmother; Hypertension in her father; Melanoma in her daughter; Pancreatic cancer (age of onset: 98) in her paternal aunt; Rectal cancer (age of onset: 90) in her paternal aunt; Stroke in her paternal grandfather.  ROS:   Please see the history of present illness.    Depression related to COVID-19 all other systems reviewed and are negative.  EKGs/Labs/Other Studies Reviewed:    The following studies were reviewed today: No new functional or imaging data  EKG:  EKG performed 04/15/2018, demonstrates AV sequential pacing.  Recent Labs: No results found for requested labs within last 8760 hours.  Recent Lipid Panel No results found for: CHOL, TRIG, HDL, CHOLHDL, VLDL, LDLCALC, LDLDIRECT  Physical Exam:    VS:  BP 134/88   Pulse 73   Ht 5\' 3"  (1.6 m)   Wt 163 lb 1.9 oz (74 kg)   SpO2 96%   BMI 28.90 kg/m     Wt Readings from Last 3 Encounters:  12/08/18 163 lb 1.9 oz (74 kg)  04/15/18 159 lb (72.1 kg)   11/04/17 155 lb (70.3 kg)     GEN: Mild torso and abdominal obesity. No acute distress HEENT: Normal NECK: No JVD. LYMPHATICS: No lymphadenopathy CARDIAC: RRR.  No murmur, no gallop, no edema VASCULAR: 2+ bilateral pulses, no bruits RESPIRATORY:  Clear to auscultation without rales, wheezing or rhonchi  ABDOMEN: Soft, non-tender, non-distended, No pulsatile mass, MUSCULOSKELETAL: No deformity  SKIN: Warm and dry NEUROLOGIC:  Alert and oriented x 3 PSYCHIATRIC:  Normal affect   ASSESSMENT:    1. SSS (sick sinus syndrome) ([place])   2. Chronic diastolic heart failure ([employer])   3.  Cardiac pacemaker in situ   4. Coronary artery disease involving native coronary artery of native heart without angina pectoris   5. Hyperlipidemia, unspecified hyperlipidemia type   6. Left bundle branch block    PLAN:    In order of problems listed above:  1. Normal pacemaker function when last evaluated 2. No clinical volume overload and no symptoms of dyspnea. 3. Being followed by device clinic 4. LDL cholesterol target less than 70.  Most recent LDL was 89 in 2019.  Consider adding Zetia if next LDL is greater than 80. 5. Target less than 70.  Please see dialogue above.  Clinical follow-up with me in 1 year.   Medication Adjustments/Labs and Tests Ordered: Current medicines are reviewed at length with the patient today.  Concerns regarding medicines are outlined above.  No orders of the defined types were placed in this encounter.  No orders of the defined types were placed in this encounter.   There are no Patient Instructions on file for this visit.   Signed, [name], MD  12/08/2018 12:01 PM    [place]

## 2018-12-08 ENCOUNTER — Ambulatory Visit (INDEPENDENT_AMBULATORY_CARE_PROVIDER_SITE_OTHER): Payer: Medicare HMO | Admitting: Interventional Cardiology

## 2018-12-08 ENCOUNTER — Other Ambulatory Visit: Payer: Self-pay

## 2018-12-08 ENCOUNTER — Telehealth: Payer: Self-pay | Admitting: Interventional Cardiology

## 2018-12-08 ENCOUNTER — Encounter: Payer: Self-pay | Admitting: Interventional Cardiology

## 2018-12-08 VITALS — BP 134/88 | HR 73 | Ht 63.0 in | Wt 163.1 lb

## 2018-12-08 DIAGNOSIS — Z950 Presence of cardiac pacemaker: Secondary | ICD-10-CM

## 2018-12-08 DIAGNOSIS — E785 Hyperlipidemia, unspecified: Secondary | ICD-10-CM | POA: Diagnosis not present

## 2018-12-08 DIAGNOSIS — I447 Left bundle-branch block, unspecified: Secondary | ICD-10-CM | POA: Diagnosis not present

## 2018-12-08 DIAGNOSIS — I5032 Chronic diastolic (congestive) heart failure: Secondary | ICD-10-CM

## 2018-12-08 DIAGNOSIS — I495 Sick sinus syndrome: Secondary | ICD-10-CM | POA: Diagnosis not present

## 2018-12-08 DIAGNOSIS — I2510 Atherosclerotic heart disease of native coronary artery without angina pectoris: Secondary | ICD-10-CM

## 2018-12-08 NOTE — Patient Instructions (Signed)

## 2018-12-08 NOTE — Telephone Encounter (Signed)

## 2018-12-29 ENCOUNTER — Telehealth: Payer: Self-pay | Admitting: Oncology

## 2018-12-29 NOTE — Telephone Encounter (Signed)
Called pt per 7/29 sch message - no answer . Left message  For patient to call back for r/s

## 2018-12-30 ENCOUNTER — Inpatient Hospital Stay: Payer: Medicare HMO

## 2018-12-30 ENCOUNTER — Inpatient Hospital Stay: Payer: Medicare HMO | Admitting: Oncology

## 2019-01-03 ENCOUNTER — Ambulatory Visit (INDEPENDENT_AMBULATORY_CARE_PROVIDER_SITE_OTHER): Payer: Medicare HMO | Admitting: *Deleted

## 2019-01-03 ENCOUNTER — Encounter

## 2019-01-03 DIAGNOSIS — I495 Sick sinus syndrome: Secondary | ICD-10-CM

## 2019-01-04 LAB — CUP PACEART REMOTE DEVICE CHECK
Battery Impedance: 159 Ohm
Battery Remaining Longevity: 111 mo
Battery Voltage: 2.78 V
Brady Statistic AP VP Percent: 2 %
Brady Statistic AP VS Percent: 79 %
Brady Statistic AS VP Percent: 1 %
Brady Statistic AS VS Percent: 19 %
Date Time Interrogation Session: 20200803174318
Implantable Lead Implant Date: 19970926
Implantable Lead Implant Date: 19970926
Implantable Lead Location: 753859
Implantable Lead Location: 753860
Implantable Lead Model: 4024
Implantable Lead Model: 4524
Implantable Pulse Generator Implant Date: 20171214
Lead Channel Impedance Value: 339 Ohm
Lead Channel Impedance Value: 963 Ohm
Lead Channel Pacing Threshold Amplitude: 0.75 V
Lead Channel Pacing Threshold Amplitude: 1.625 V
Lead Channel Pacing Threshold Pulse Width: 0.4 ms
Lead Channel Pacing Threshold Pulse Width: 0.4 ms
Lead Channel Setting Pacing Amplitude: 2.5 V
Lead Channel Setting Pacing Amplitude: 2.5 V
Lead Channel Setting Pacing Pulse Width: 0.4 ms
Lead Channel Setting Sensing Sensitivity: 4 mV

## 2019-01-04 NOTE — Progress Notes (Signed)
[place]  [phone] [phone]     ID: TAMU GOLZ DOB: 1945/10/26  MR#: [id number]  UXN#:[id number]  Patient Care Team: Alexis Fess, MD as PCP - General (Family Medicine) [name], Alexis Dad, MD as Consulting Physician (Oncology) Alexis Pray, MD as Consulting Physician (Radiation Oncology) Alexis Overall, MD as Consulting Physician (General Surgery) Alexis Irani, MD (Inactive) as Consulting Physician (Gastroenterology) Alexis Bison Charlestine Massed, NP as Nurse Practitioner (Hematology and Oncology) Alexis Sprang, MD as Consulting Physician (Cardiology) OTHER MD:   CHIEF COMPLAINT: HER-2 positive breast cancer  CURRENT TREATMENT: Tamoxifen   INTERVAL HISTORY: [name] returns today for follow-up of her estrogen receptor positive breast cancer.   She continues on tamoxifen with good tolerance. She reports daily, intense hot flashes with sweating. She also reports vaginal discharge.  Since her last visit, she presented for her annual mammogram with concerns regarding a lump in the left axilla for three months and left breast soreness for 2-3 weeks. She underwent bilateral diagnostic mammography with tomography and left axilla ultrasound at [facility] on 03/19/2018 showing: breast density category A; no evidence of malignancy in either breast.  Of note, she also underwent skin shave biopsy of her left upper arm on 12/25/2018. Pathology from the report ([id number]) showed no atypia.   REVIEW OF SYSTEMS: [name] reports she walks and does video exercises.. She states her husband is still under hospice, but he is stable (he has cirrhosis of the liver from chemical work in the past). She has 13 grandchildren and 4 great-grandchildren. A detailed review of systems was otherwise noncontributory.     BREAST CANCER HISTORY: From the original intake note:   [name] had bilateral screening mammography at [facility] 03/28/2016. This found the breast density to be  category A. In the left breast at the 12:00 position there was a new oval lesion. There were no other findings of concern. On 04/04/2016 the patient underwent left ultrasonography this showed a possible hypoechoic lesion in the upper outer quadrant of the left breast, measuring approximately 0.5 cm. The left axilla was mammographically benign.  Biopsy of the left breast area in question 04/09/2016 showed ([id number]) invasive ductal carcinoma, grade 1 or 2, estrogen and progesterone receptor negative, with an MIB-1 of 10%, but HER-2 amplified, the signals ratio being 7.36 and the number per cell 9.20.  Her subsequent history is as detailed below.   PAST MEDICAL HISTORY: Past Medical History:  Diagnosis Date  . Allergic rhinitis   . Arthritis   . Family history of breast cancer   . Family history of colon cancer   . Family history of pancreatic cancer   . GERD (gastroesophageal reflux disease)   . GI bleed   . History of basal cell cancer   . History of radiation therapy 08/05/2016 - 09/12/2016   Left Breast 50.4 Gy 28 fractions  . Hyperlipidemia   . Hypertension   . Hypothyroidism   . Lumbar disc disease   . Malignant neoplasm of upper-outer quadrant of left female breast ([place]) 04/11/2016  . Pacemaker   . Peptic ulcer disease   . SSS (sick sinus syndrome) ([place])   . Thyroid disease     PAST SURGICAL HISTORY: Past Surgical History:  Procedure Laterality Date  . ABDOMINAL HYSTERECTOMY    . BREAST LUMPECTOMY WITH RADIOACTIVE SEED AND SENTINEL LYMPH NODE BIOPSY Left 06/06/2016   Procedure: LEFT BREAST LUMPECTOMY WITH RADIOACTIVE SEED AND LEFT AXILLARY SENTINEL LYMPH NODE BIOPSY;  Surgeon: Alexis Overall, MD;  Location:  [place] [facility];  Service: General;  Laterality: Left;  . BREAST SURGERY     left  . EP IMPLANTABLE DEVICE N/A 05/15/2016   Procedure: PPM Generator Changeout;  Surgeon: Evans Lance, MD;  Location: [facility];  Service: Cardiovascular;  Laterality: N/A;  . HERNIA  REPAIR    . LEFT HEART CATH AND CORONARY ANGIOGRAPHY N/A 10/09/2017   Procedure: LEFT HEART CATH AND CORONARY ANGIOGRAPHY;  Surgeon: Belva Crome, MD;  Location: [facility];  Service: Cardiovascular;  Laterality: N/A;  . PACEMAKER INSERTION      FAMILY HISTORY Family History  Problem Relation Age of Onset  . Congestive Heart Failure Mother   . Hypertension Father   . Bone cancer Father   . Breast cancer Sister 80  . Bradycardia Cousin   . Breast cancer Cousin 7  . Breast cancer Cousin 79  . Colon cancer Cousin 1  . Pancreatic cancer Paternal Aunt 76  . Rectal cancer Paternal Aunt 40  . Heart attack Maternal Grandmother   . Heart attack Paternal Grandmother   . Stroke Paternal Grandfather   . Melanoma Daughter        dx in her 44s  The patient's father died at the age of 66 with myeloma. The patient's mother died at the age of 50 from heart disease the patient has a sister diagnosed with breast cancer at age 11, a paternal cousin diagnosed with breast cancer at age 80, and a maternal cousin diagnosed with breast cancer. There is also a history of pancreatic, rectal, and: colon cancers all on the father's side of the family   GYNECOLOGIC HISTORY:  No LMP recorded. Patient has had a hysterectomy. Menarche age 56, first live birth age 80, the patient is [name]. She stopped having periods in 1981. She did not take hormone replacement. She used oral contraceptives remotely with no complications   SOCIAL HISTORY: (updated 01/2019) She has worked as a Pharmacist, hospital substituted, in Scientist, research (medical), and more recently as a Engineer, building services. Her husband [name]") [name] has been under the care of hospice and is stable. The patient's daughter [name] lives in [place] where she works as a Pharmacist, hospital. Her daughter [name] lives in [place]. She has 13 grandchildren and 4 great-grandchildren. 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     ADVANCED DIRECTIVES: The patient's daughter,  [name], is her healthcare power of attorney. She can be reached at [phone]   HEALTH MAINTENANCE: Social History   Tobacco Use  . Smoking status: Never Smoker  . Smokeless tobacco: Never Used  Substance Use Topics  . Alcohol use: No  . Drug use: No     Colonoscopy: 2007/ [facility]  PAP:  Bone density: 03/28/2016 at [facility], T score of -1.3   Allergies  Allergen Reactions  . Bee Venom Shortness Of Breath and Itching  . Morphine And Related Shortness Of Breath and Itching  . Nsaids Other (See Comments)    Upper GI bleed  . Wasp Venom Shortness Of Breath and Itching  . Penicillins Itching and Other (See Comments)    Has patient had a PCN reaction causing immediate rash, facial/tongue/throat swelling, SOB or lightheadedness with hypotension:No Has patient had a PCN reaction causing severe rash involving mucus membranes or skin necrosis:No Has patient had a PCN reaction that required hospitalization:No Has patient had a PCN reaction occurring within the last 10 years:No If all of the above answers are "NO", then may proceed with Cephalosporin  use.   . Sulfa Antibiotics Itching    Current Outpatient Medications  Medication Sig Dispense Refill  . albuterol (PROVENTIL HFA;VENTOLIN HFA) 108 (90 BASE) MCG/ACT inhaler Inhale 2 puffs into the lungs every 6 (six) hours as needed for wheezing or shortness of breath.     Marland Kitchen albuterol (PROVENTIL) (2.5 MG/3ML) 0.083% nebulizer solution Take 2.5 mg by nebulization every 6 (six) hours as needed for wheezing or shortness of breath.    Marland Kitchen atorvastatin (LIPITOR) 80 MG tablet Take 1 tablet (80 mg total) by mouth daily. 90 tablet 3  . levothyroxine (SYNTHROID, LEVOTHROID) 100 MCG tablet Take 100 mcg by mouth daily before breakfast.    . mometasone (NASONEX) 50 MCG/ACT nasal spray Place 2 sprays into the nose daily.    . Omega-3 Fatty Acids (FISH OIL) 1000 MG CAPS Take 1,000 mg by mouth daily.    Marland Kitchen omeprazole (PRILOSEC) 20 MG capsule Take 20 mg  by mouth daily.    . tamoxifen (NOLVADEX) 20 MG tablet Take 1 tablet by mouth once daily 90 tablet 0  . triamterene-hydrochlorothiazide (MAXZIDE) 75-50 MG tablet Take 1 tablet by mouth every evening.     . venlafaxine XR (EFFEXOR-XR) 37.5 MG 24 hr capsule Take 1 capsule (37.5 mg total) by mouth daily with breakfast. 90 capsule 4   No current facility-administered medications for this visit.     OBJECTIVE: Middle-aged white woman in no acute distress  Vitals:   01/06/19 0905  BP: 139/74  Pulse: 70  Resp: 18  Temp: 97.8 F (36.6 C)  SpO2: 97%     Body mass index is 28.22 kg/m.    ECOG FS:0 - Asymptomatic  Sclerae unicteric, EOMs intact Wearing a mask No cervical or supraclavicular adenopathy Lungs no rales or rhonchi Heart regular rate and rhythm Abd soft, nontender, positive bowel sounds MSK no focal spinal tenderness, no upper extremity lymphedema Neuro: nonfocal, well oriented, appropriate affect Breasts: Right breast is unremarkable.  The left breast is status post lumpectomy and radiation.  There is no evidence of local recurrence.  Both axillae are benign.   LAB RESULTS:  CMP     Component Value Date/Time   NA 142 10/10/2017 0559   NA 143 10/02/2017 1421   NA 142 04/16/2016 0822   K 3.4 (L) 10/10/2017 0559   K 3.8 04/16/2016 0822   CL 109 10/10/2017 0559   CO2 23 10/10/2017 0559   CO2 23 04/16/2016 0822   GLUCOSE 154 (H) 10/10/2017 0559   GLUCOSE 102 04/16/2016 0822   BUN 10 10/10/2017 0559   BUN 19 10/02/2017 1421   BUN 25.3 04/16/2016 0822   CREATININE 0.70 10/10/2017 0559   CREATININE 0.97 (H) 05/01/2016 1627   CREATININE 0.8 04/16/2016 0822   CALCIUM 8.6 (L) 10/10/2017 0559   CALCIUM 9.7 04/16/2016 0822   PROT 6.4 (L) 10/09/2017 1554   PROT 6.9 04/16/2016 0822   ALBUMIN 3.8 10/09/2017 1554   ALBUMIN 3.8 04/16/2016 0822   AST 20 10/09/2017 1554   AST 20 04/16/2016 0822   ALT 16 10/09/2017 1554   ALT 18 04/16/2016 0822   ALKPHOS 64 10/09/2017 1554    ALKPHOS 68 04/16/2016 0822   BILITOT 1.0 10/09/2017 1554   BILITOT 0.60 04/16/2016 0822   GFRNONAA >60 10/10/2017 0559   GFRAA >60 10/10/2017 0559    INo results found for: SPEP, UPEP  Lab Results  Component Value Date   WBC 5.2 01/06/2019   NEUTROABS 3.6 01/06/2019   HGB 14.6 01/06/2019  HCT 43.4 01/06/2019   MCV 85.8 01/06/2019   PLT 283 01/06/2019      Chemistry      Component Value Date/Time   NA 142 10/10/2017 0559   NA 143 10/02/2017 1421   NA 142 04/16/2016 0822   K 3.4 (L) 10/10/2017 0559   K 3.8 04/16/2016 0822   CL 109 10/10/2017 0559   CO2 23 10/10/2017 0559   CO2 23 04/16/2016 0822   BUN 10 10/10/2017 0559   BUN 19 10/02/2017 1421   BUN 25.3 04/16/2016 0822   CREATININE 0.70 10/10/2017 0559   CREATININE 0.97 (H) 05/01/2016 1627   CREATININE 0.8 04/16/2016 0822      Component Value Date/Time   CALCIUM 8.6 (L) 10/10/2017 0559   CALCIUM 9.7 04/16/2016 0822   ALKPHOS 64 10/09/2017 1554   ALKPHOS 68 04/16/2016 0822   AST 20 10/09/2017 1554   AST 20 04/16/2016 0822   ALT 16 10/09/2017 1554   ALT 18 04/16/2016 0822   BILITOT 1.0 10/09/2017 1554   BILITOT 0.60 04/16/2016 0822       No results found for: LABCA2  No components found for: LABCA125  No results for input(s): INR in the last 168 hours.  Urinalysis No results found for: COLORURINE, APPEARANCEUR, LABSPEC, PHURINE, GLUCOSEU, HGBUR, BILIRUBINUR, KETONESUR, PROTEINUR, UROBILINOGEN, NITRITE, LEUKOCYTESUR   STUDIES: No results found.  ELIGIBLE FOR AVAILABLE RESEARCH PROTOCOL: no  ASSESSMENT: 73 y.o. [place] woman status post left breast upper outer quadrant biopsy 04/09/2016 for a clinical T1a N0, stage IA invasive ductal carcinoma, grade 1 or 2, estrogen and progesterone receptor negative, with an MIB-1 of 10%, but HER-2 amplified  (1) left lumpectomy and sentinel lymph node samplng 06/06/2016 showed a pT1a pN0, stage IA  Invasive ductal carcinoma , grade 2 , with negatve margins   (2) No consideration of anti-HER-2 immunotherapy/chemotherapy given final tumor size  (3) adjuvant radiation 08/05/16-09/12/16 Site/dose:   Left breast/ 50.4 Gy in 28 fractions  (a) the patient's pacemaker has been moved to the contralateral side to facilitate treatment   (4)  Genetics testing through the Comprehensive Cancer Panel offered by [facility]  found no deleterious mutations in APC, ATM, AXIN2, BARD1, BMPR1A, BRCA1, BRCA2, BRIP1, CDH1, CDK4, CDKN2A, CHEK2, EPCAM, FANCC, MLH1, MSH2, MSH6, MUTYH, NBN, PALB2, PMS2, POLD1, POLE, PTEN, RAD51C, RAD51D, SCG5/GREM1, SMAD4, STK11, TP53, VHL, and XRCC2.     (5) started tamoxifen 12/13/2016 for prophylaxis  (a) bone density 08/07/2003 showed a T score of -1.0.  (b) bone density at [facility] 03/28/2016 showed a T score of -1.3.  PLAN: [name] is now 2-1/2 years out from definitive surgery for breast cancer with no evidence of disease recurrence.  This is very favorable.  She is tolerating tamoxifen generally well and the plan continues to be to continue for a total of 5 years, which means she will cut in half her risk of developing another breast cancer.  I think she would benefit from venlafaxine.  We discussed the possible toxicities side effects and complications of this agent today.  She will start at 37.5 daily and if she does not get a good response after a couple of weeks she will let us know and we will go up to 75.  She will have mammography in October and see her surgeon shortly after that.  She will see me again in May and we will follow her on a yearly basis thereafter  She knows to call for any other issue that may develop before then.  [name], Alexis Dad, MD  01/06/19 9:27 AM Medical Oncology and Hematology [facility] [address] [place],  [address] Tel. [phone]    Fax. [phone]   I, Wilburn Mylar, am acting as scribe for Dr. Virgie [name]. [name].  I, Lurline Del MD, have reviewed the  above documentation for accuracy and completeness, and I agree with the above.

## 2019-01-05 ENCOUNTER — Telehealth: Payer: Self-pay | Admitting: Oncology

## 2019-01-05 NOTE — Telephone Encounter (Signed)
Called patient regarding upcoming Web[facility] appointment, left a voicemail and this will be a walk-in visit due to no communication to set this up as virtual.

## 2019-01-06 ENCOUNTER — Inpatient Hospital Stay: Payer: Medicare HMO | Attending: Oncology

## 2019-01-06 ENCOUNTER — Telehealth: Payer: Self-pay | Admitting: Oncology

## 2019-01-06 ENCOUNTER — Other Ambulatory Visit: Payer: Self-pay

## 2019-01-06 ENCOUNTER — Inpatient Hospital Stay (HOSPITAL_BASED_OUTPATIENT_CLINIC_OR_DEPARTMENT_OTHER): Payer: Medicare HMO | Admitting: Oncology

## 2019-01-06 VITALS — BP 139/74 | HR 70 | Temp 97.8°F | Resp 18 | Ht 63.0 in | Wt 159.3 lb

## 2019-01-06 DIAGNOSIS — C50412 Malignant neoplasm of upper-outer quadrant of left female breast: Secondary | ICD-10-CM | POA: Diagnosis not present

## 2019-01-06 DIAGNOSIS — Z8489 Family history of other specified conditions: Secondary | ICD-10-CM | POA: Diagnosis not present

## 2019-01-06 DIAGNOSIS — R232 Flushing: Secondary | ICD-10-CM | POA: Diagnosis not present

## 2019-01-06 DIAGNOSIS — Z880 Allergy status to penicillin: Secondary | ICD-10-CM | POA: Diagnosis not present

## 2019-01-06 DIAGNOSIS — Z808 Family history of malignant neoplasm of other organs or systems: Secondary | ICD-10-CM | POA: Insufficient documentation

## 2019-01-06 DIAGNOSIS — Z171 Estrogen receptor negative status [ER-]: Secondary | ICD-10-CM | POA: Diagnosis not present

## 2019-01-06 DIAGNOSIS — Z803 Family history of malignant neoplasm of breast: Secondary | ICD-10-CM | POA: Diagnosis not present

## 2019-01-06 DIAGNOSIS — Z886 Allergy status to analgesic agent status: Secondary | ICD-10-CM | POA: Insufficient documentation

## 2019-01-06 DIAGNOSIS — Z170 Estrogen receptor positive status [ER+]: Secondary | ICD-10-CM | POA: Insufficient documentation

## 2019-01-06 DIAGNOSIS — Z79810 Long term (current) use of selective estrogen receptor modulators (SERMs): Secondary | ICD-10-CM | POA: Diagnosis not present

## 2019-01-06 DIAGNOSIS — Z885 Allergy status to narcotic agent status: Secondary | ICD-10-CM | POA: Diagnosis not present

## 2019-01-06 DIAGNOSIS — M1990 Unspecified osteoarthritis, unspecified site: Secondary | ICD-10-CM | POA: Diagnosis not present

## 2019-01-06 DIAGNOSIS — Z823 Family history of stroke: Secondary | ICD-10-CM | POA: Insufficient documentation

## 2019-01-06 DIAGNOSIS — Z79899 Other long term (current) drug therapy: Secondary | ICD-10-CM | POA: Insufficient documentation

## 2019-01-06 DIAGNOSIS — K7460 Unspecified cirrhosis of liver: Secondary | ICD-10-CM | POA: Diagnosis not present

## 2019-01-06 DIAGNOSIS — Z800 Family history of malignant neoplasm of digestive organs: Secondary | ICD-10-CM | POA: Insufficient documentation

## 2019-01-06 DIAGNOSIS — Z8249 Family history of ischemic heart disease and other diseases of the circulatory system: Secondary | ICD-10-CM | POA: Insufficient documentation

## 2019-01-06 DIAGNOSIS — Z882 Allergy status to sulfonamides status: Secondary | ICD-10-CM | POA: Diagnosis not present

## 2019-01-06 LAB — CBC WITH DIFFERENTIAL/PLATELET
Abs Immature Granulocytes: 0.01 10*3/uL (ref 0.00–0.07)
Basophils Absolute: 0 10*3/uL (ref 0.0–0.1)
Basophils Relative: 1 %
Eosinophils Absolute: 0.1 10*3/uL (ref 0.0–0.5)
Eosinophils Relative: 3 %
HCT: 43.4 % (ref 36.0–46.0)
Hemoglobin: 14.6 g/dL (ref 12.0–15.0)
Immature Granulocytes: 0 %
Lymphocytes Relative: 18 %
Lymphs Abs: 0.9 10*3/uL (ref 0.7–4.0)
MCH: 28.9 pg (ref 26.0–34.0)
MCHC: 33.6 g/dL (ref 30.0–36.0)
MCV: 85.8 fL (ref 80.0–100.0)
Monocytes Absolute: 0.5 10*3/uL (ref 0.1–1.0)
Monocytes Relative: 9 %
Neutro Abs: 3.6 10*3/uL (ref 1.7–7.7)
Neutrophils Relative %: 69 %
Platelets: 283 10*3/uL (ref 150–400)
RBC: 5.06 MIL/uL (ref 3.87–5.11)
RDW: 12.7 % (ref 11.5–15.5)
WBC: 5.2 10*3/uL (ref 4.0–10.5)
nRBC: 0 % (ref 0.0–0.2)

## 2019-01-06 LAB — COMPREHENSIVE METABOLIC PANEL
ALT: 15 U/L (ref 0–44)
AST: 19 U/L (ref 15–41)
Albumin: 4.3 g/dL (ref 3.5–5.0)
Alkaline Phosphatase: 73 U/L (ref 38–126)
Anion gap: 10 (ref 5–15)
BUN: 15 mg/dL (ref 8–23)
CO2: 25 mmol/L (ref 22–32)
Calcium: 9.9 mg/dL (ref 8.9–10.3)
Chloride: 106 mmol/L (ref 98–111)
Creatinine, Ser: 0.86 mg/dL (ref 0.44–1.00)
GFR calc Af Amer: >60 mL/min (ref >60)
GFR calc non Af Amer: >60 mL/min (ref >60)
Glucose, Bld: 106 mg/dL — ABNORMAL HIGH (ref 70–99)
Potassium: 4.6 mmol/L (ref 3.5–5.1)
Sodium: 141 mmol/L (ref 135–145)
Total Bilirubin: 0.8 mg/dL (ref 0.3–1.2)
Total Protein: 7.1 g/dL (ref 6.5–8.1)

## 2019-01-06 MED ORDER — VENLAFAXINE HCL ER 37.5 MG PO CP24: 37.5000 mg | ORAL_CAPSULE | Freq: Every day | ORAL | 4 refills | Status: DC

## 2019-01-06 NOTE — Telephone Encounter (Signed)
I talk with patient regarding schedule  

## 2019-01-13 ENCOUNTER — Encounter: Payer: Self-pay | Admitting: Cardiology

## 2019-01-13 NOTE — Progress Notes (Signed)
Remote pacemaker transmission.   

## 2019-01-26 DIAGNOSIS — E782 Mixed hyperlipidemia: Secondary | ICD-10-CM | POA: Diagnosis not present

## 2019-01-26 DIAGNOSIS — M8580 Other specified disorders of bone density and structure, unspecified site: Secondary | ICD-10-CM | POA: Diagnosis not present

## 2019-01-26 DIAGNOSIS — J4520 Mild intermittent asthma, uncomplicated: Secondary | ICD-10-CM | POA: Diagnosis not present

## 2019-01-26 DIAGNOSIS — E039 Hypothyroidism, unspecified: Secondary | ICD-10-CM | POA: Diagnosis not present

## 2019-01-26 DIAGNOSIS — I10 Essential (primary) hypertension: Secondary | ICD-10-CM | POA: Diagnosis not present

## 2019-01-26 DIAGNOSIS — I5032 Chronic diastolic (congestive) heart failure: Secondary | ICD-10-CM | POA: Diagnosis not present

## 2019-01-26 DIAGNOSIS — I2510 Atherosclerotic heart disease of native coronary artery without angina pectoris: Secondary | ICD-10-CM | POA: Diagnosis not present

## 2019-01-26 DIAGNOSIS — Z853 Personal history of malignant neoplasm of breast: Secondary | ICD-10-CM | POA: Diagnosis not present

## 2019-02-03 DIAGNOSIS — Z23 Encounter for immunization: Secondary | ICD-10-CM | POA: Diagnosis not present

## 2019-02-10 DIAGNOSIS — M8580 Other specified disorders of bone density and structure, unspecified site: Secondary | ICD-10-CM | POA: Diagnosis not present

## 2019-02-10 DIAGNOSIS — J4520 Mild intermittent asthma, uncomplicated: Secondary | ICD-10-CM | POA: Diagnosis not present

## 2019-02-10 DIAGNOSIS — I2510 Atherosclerotic heart disease of native coronary artery without angina pectoris: Secondary | ICD-10-CM | POA: Diagnosis not present

## 2019-02-10 DIAGNOSIS — C50919 Malignant neoplasm of unspecified site of unspecified female breast: Secondary | ICD-10-CM | POA: Diagnosis not present

## 2019-02-10 DIAGNOSIS — Z853 Personal history of malignant neoplasm of breast: Secondary | ICD-10-CM | POA: Diagnosis not present

## 2019-02-10 DIAGNOSIS — E039 Hypothyroidism, unspecified: Secondary | ICD-10-CM | POA: Diagnosis not present

## 2019-02-10 DIAGNOSIS — E782 Mixed hyperlipidemia: Secondary | ICD-10-CM | POA: Diagnosis not present

## 2019-02-10 DIAGNOSIS — I5032 Chronic diastolic (congestive) heart failure: Secondary | ICD-10-CM | POA: Diagnosis not present

## 2019-02-10 DIAGNOSIS — I10 Essential (primary) hypertension: Secondary | ICD-10-CM | POA: Diagnosis not present

## 2019-02-11 ENCOUNTER — Other Ambulatory Visit: Payer: Self-pay | Admitting: *Deleted

## 2019-02-11 MED ORDER — TAMOXIFEN CITRATE 20 MG PO TABS: 20.0000 mg | ORAL_TABLET | Freq: Every day | ORAL | 3 refills | Status: DC

## 2019-02-25 DIAGNOSIS — I10 Essential (primary) hypertension: Secondary | ICD-10-CM | POA: Diagnosis not present

## 2019-02-25 DIAGNOSIS — Z8719 Personal history of other diseases of the digestive system: Secondary | ICD-10-CM | POA: Diagnosis not present

## 2019-02-25 DIAGNOSIS — Z853 Personal history of malignant neoplasm of breast: Secondary | ICD-10-CM | POA: Diagnosis not present

## 2019-02-25 DIAGNOSIS — Z8669 Personal history of other diseases of the nervous system and sense organs: Secondary | ICD-10-CM | POA: Diagnosis not present

## 2019-02-25 DIAGNOSIS — Z950 Presence of cardiac pacemaker: Secondary | ICD-10-CM | POA: Diagnosis not present

## 2019-02-25 DIAGNOSIS — I5032 Chronic diastolic (congestive) heart failure: Secondary | ICD-10-CM | POA: Diagnosis not present

## 2019-02-25 DIAGNOSIS — Z0000 Encounter for general adult medical examination without abnormal findings: Secondary | ICD-10-CM | POA: Diagnosis not present

## 2019-02-25 DIAGNOSIS — E039 Hypothyroidism, unspecified: Secondary | ICD-10-CM | POA: Diagnosis not present

## 2019-02-25 DIAGNOSIS — Z23 Encounter for immunization: Secondary | ICD-10-CM | POA: Diagnosis not present

## 2019-02-25 DIAGNOSIS — Z1159 Encounter for screening for other viral diseases: Secondary | ICD-10-CM | POA: Diagnosis not present

## 2019-02-25 DIAGNOSIS — E782 Mixed hyperlipidemia: Secondary | ICD-10-CM | POA: Diagnosis not present

## 2019-03-10 DIAGNOSIS — I10 Essential (primary) hypertension: Secondary | ICD-10-CM | POA: Diagnosis not present

## 2019-03-10 DIAGNOSIS — J4520 Mild intermittent asthma, uncomplicated: Secondary | ICD-10-CM | POA: Diagnosis not present

## 2019-03-10 DIAGNOSIS — I2510 Atherosclerotic heart disease of native coronary artery without angina pectoris: Secondary | ICD-10-CM | POA: Diagnosis not present

## 2019-03-10 DIAGNOSIS — Z853 Personal history of malignant neoplasm of breast: Secondary | ICD-10-CM | POA: Diagnosis not present

## 2019-03-10 DIAGNOSIS — E782 Mixed hyperlipidemia: Secondary | ICD-10-CM | POA: Diagnosis not present

## 2019-03-10 DIAGNOSIS — C50919 Malignant neoplasm of unspecified site of unspecified female breast: Secondary | ICD-10-CM | POA: Diagnosis not present

## 2019-03-10 DIAGNOSIS — E039 Hypothyroidism, unspecified: Secondary | ICD-10-CM | POA: Diagnosis not present

## 2019-03-10 DIAGNOSIS — I5032 Chronic diastolic (congestive) heart failure: Secondary | ICD-10-CM | POA: Diagnosis not present

## 2019-03-10 DIAGNOSIS — M8580 Other specified disorders of bone density and structure, unspecified site: Secondary | ICD-10-CM | POA: Diagnosis not present

## 2019-03-25 ENCOUNTER — Ambulatory Visit: Payer: Self-pay | Admitting: Allergy

## 2019-04-04 ENCOUNTER — Encounter: Payer: Medicare HMO | Admitting: *Deleted

## 2019-04-14 ENCOUNTER — Encounter: Payer: Self-pay | Admitting: Oncology

## 2019-04-14 DIAGNOSIS — J4520 Mild intermittent asthma, uncomplicated: Secondary | ICD-10-CM | POA: Diagnosis not present

## 2019-04-14 DIAGNOSIS — I2510 Atherosclerotic heart disease of native coronary artery without angina pectoris: Secondary | ICD-10-CM | POA: Diagnosis not present

## 2019-04-14 DIAGNOSIS — Z90710 Acquired absence of both cervix and uterus: Secondary | ICD-10-CM | POA: Diagnosis not present

## 2019-04-14 DIAGNOSIS — M85851 Other specified disorders of bone density and structure, right thigh: Secondary | ICD-10-CM | POA: Diagnosis not present

## 2019-04-14 DIAGNOSIS — M8580 Other specified disorders of bone density and structure, unspecified site: Secondary | ICD-10-CM | POA: Diagnosis not present

## 2019-04-14 DIAGNOSIS — E782 Mixed hyperlipidemia: Secondary | ICD-10-CM | POA: Diagnosis not present

## 2019-04-14 DIAGNOSIS — Z8262 Family history of osteoporosis: Secondary | ICD-10-CM | POA: Diagnosis not present

## 2019-04-14 DIAGNOSIS — C50919 Malignant neoplasm of unspecified site of unspecified female breast: Secondary | ICD-10-CM | POA: Diagnosis not present

## 2019-04-14 DIAGNOSIS — R29890 Loss of height: Secondary | ICD-10-CM | POA: Diagnosis not present

## 2019-04-14 DIAGNOSIS — I5032 Chronic diastolic (congestive) heart failure: Secondary | ICD-10-CM | POA: Diagnosis not present

## 2019-04-14 DIAGNOSIS — E039 Hypothyroidism, unspecified: Secondary | ICD-10-CM | POA: Diagnosis not present

## 2019-04-14 DIAGNOSIS — I10 Essential (primary) hypertension: Secondary | ICD-10-CM | POA: Diagnosis not present

## 2019-04-14 DIAGNOSIS — Z853 Personal history of malignant neoplasm of breast: Secondary | ICD-10-CM | POA: Diagnosis not present

## 2019-04-20 DIAGNOSIS — E039 Hypothyroidism, unspecified: Secondary | ICD-10-CM | POA: Diagnosis not present

## 2019-05-11 DIAGNOSIS — J4520 Mild intermittent asthma, uncomplicated: Secondary | ICD-10-CM | POA: Diagnosis not present

## 2019-05-11 DIAGNOSIS — I2510 Atherosclerotic heart disease of native coronary artery without angina pectoris: Secondary | ICD-10-CM | POA: Diagnosis not present

## 2019-05-11 DIAGNOSIS — I10 Essential (primary) hypertension: Secondary | ICD-10-CM | POA: Diagnosis not present

## 2019-05-11 DIAGNOSIS — C50919 Malignant neoplasm of unspecified site of unspecified female breast: Secondary | ICD-10-CM | POA: Diagnosis not present

## 2019-05-11 DIAGNOSIS — E782 Mixed hyperlipidemia: Secondary | ICD-10-CM | POA: Diagnosis not present

## 2019-05-11 DIAGNOSIS — E039 Hypothyroidism, unspecified: Secondary | ICD-10-CM | POA: Diagnosis not present

## 2019-05-11 DIAGNOSIS — M8580 Other specified disorders of bone density and structure, unspecified site: Secondary | ICD-10-CM | POA: Diagnosis not present

## 2019-05-11 DIAGNOSIS — Z853 Personal history of malignant neoplasm of breast: Secondary | ICD-10-CM | POA: Diagnosis not present

## 2019-05-11 DIAGNOSIS — I5032 Chronic diastolic (congestive) heart failure: Secondary | ICD-10-CM | POA: Diagnosis not present

## 2019-06-09 DIAGNOSIS — Z853 Personal history of malignant neoplasm of breast: Secondary | ICD-10-CM | POA: Diagnosis not present

## 2019-06-09 DIAGNOSIS — I10 Essential (primary) hypertension: Secondary | ICD-10-CM | POA: Diagnosis not present

## 2019-06-09 DIAGNOSIS — C50919 Malignant neoplasm of unspecified site of unspecified female breast: Secondary | ICD-10-CM | POA: Diagnosis not present

## 2019-06-09 DIAGNOSIS — M8580 Other specified disorders of bone density and structure, unspecified site: Secondary | ICD-10-CM | POA: Diagnosis not present

## 2019-06-09 DIAGNOSIS — J4520 Mild intermittent asthma, uncomplicated: Secondary | ICD-10-CM | POA: Diagnosis not present

## 2019-06-09 DIAGNOSIS — E039 Hypothyroidism, unspecified: Secondary | ICD-10-CM | POA: Diagnosis not present

## 2019-06-09 DIAGNOSIS — E782 Mixed hyperlipidemia: Secondary | ICD-10-CM | POA: Diagnosis not present

## 2019-06-09 DIAGNOSIS — I5032 Chronic diastolic (congestive) heart failure: Secondary | ICD-10-CM | POA: Diagnosis not present

## 2019-06-09 DIAGNOSIS — I2510 Atherosclerotic heart disease of native coronary artery without angina pectoris: Secondary | ICD-10-CM | POA: Diagnosis not present

## 2019-06-12 IMAGING — CT CT HEAD W/O CM
4 series · 17 of 47 positions shown, 19 images · non-contrast
Comparison: None.

CLINICAL DATA: Headache and visual changes. Cardiac
catheterization.

EXAM:
CT HEAD WITHOUT CONTRAST
TECHNIQUE: Contiguous axial images were obtained from the base of the skull
through the vertex without intravenous contrast.

[Series 3: head wo · axial · 0.42mm/px · z∈[-134,-10]mm · 7 of 35 slices shown, 9 images]
[im 5/35  brain]
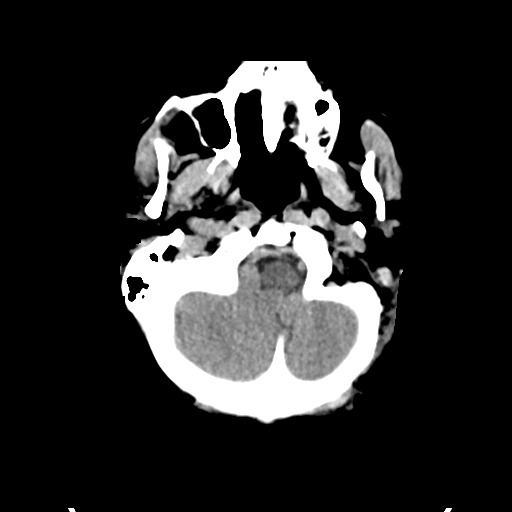
[im 5/35  bone]
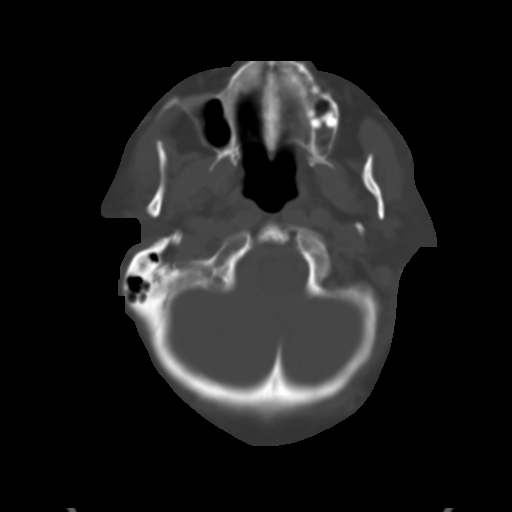
[im 9/35  brain]
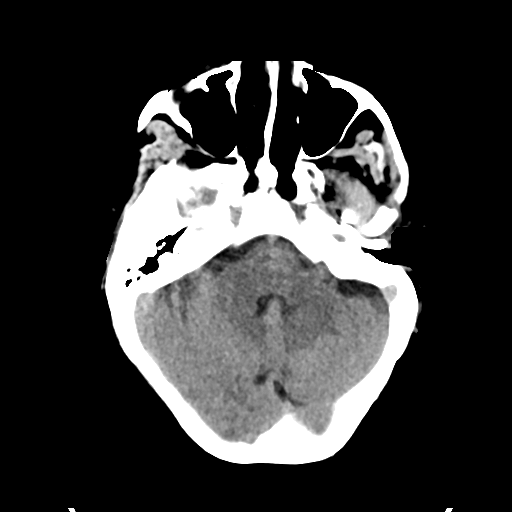
[im 13/35  brain]
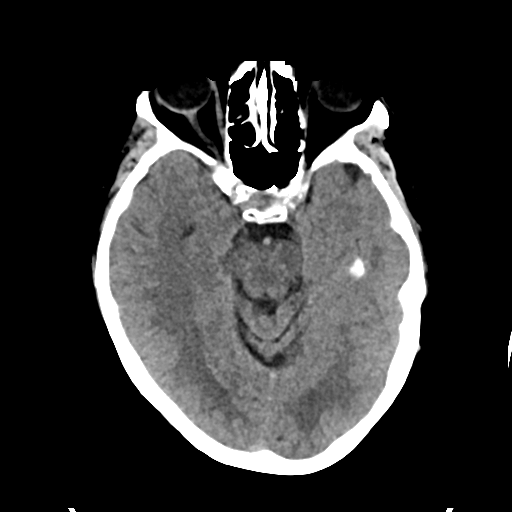
[im 18/35  brain]
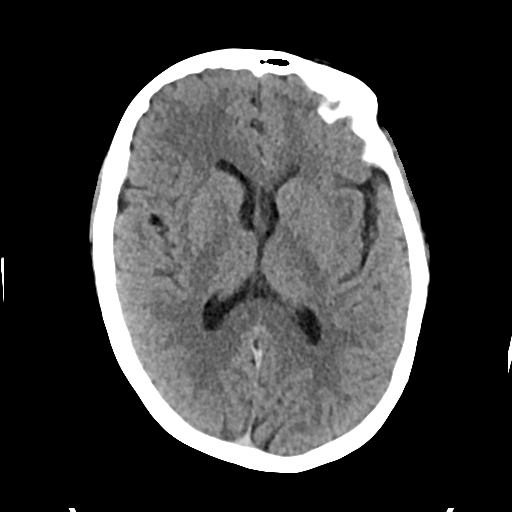
[im 22/35  brain]
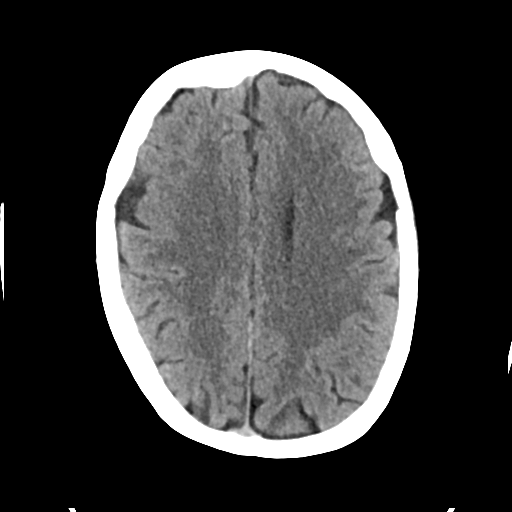
[im 22/35  bone]
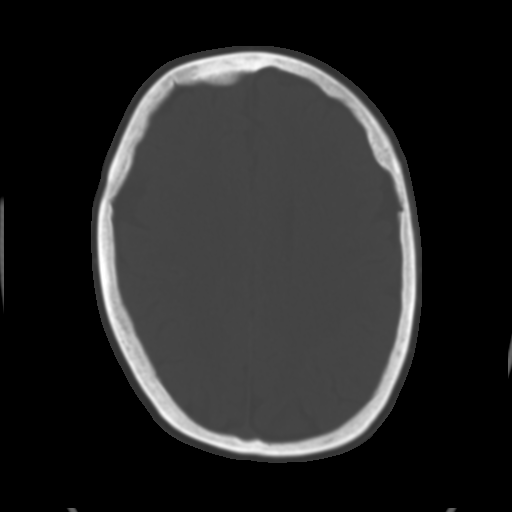
[im 26/35  brain]
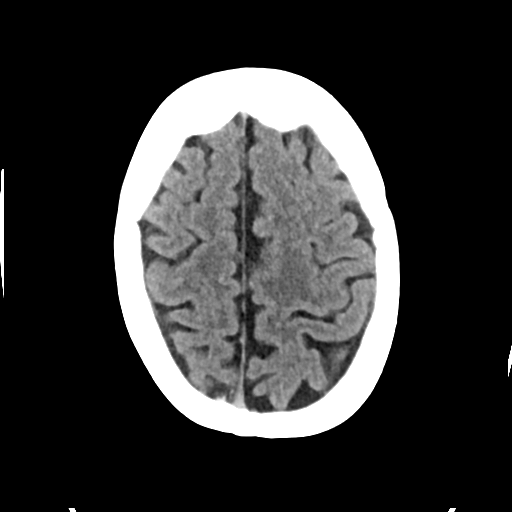
[im 30/35  brain]
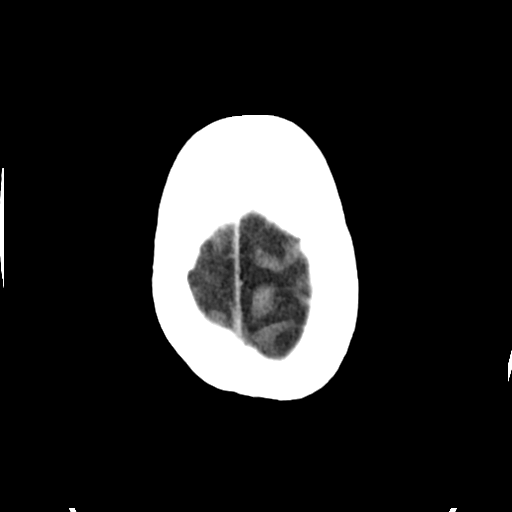

[Series 4: head bone · axial · 0.42mm/px · z∈[-138,-78]mm · 4 of 86 slices shown]
[im 9/86  bone]
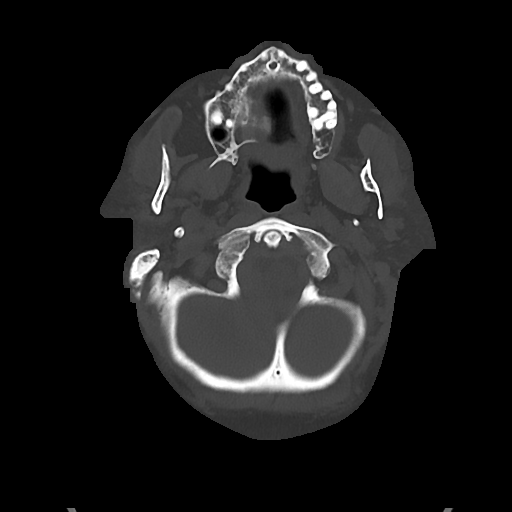
[im 18/86  bone]
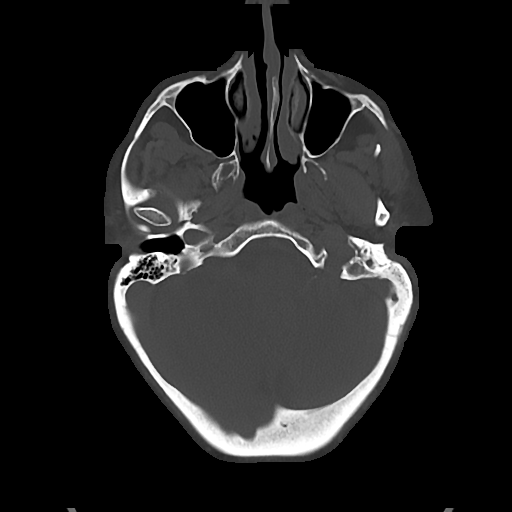
[im 26/86  bone]
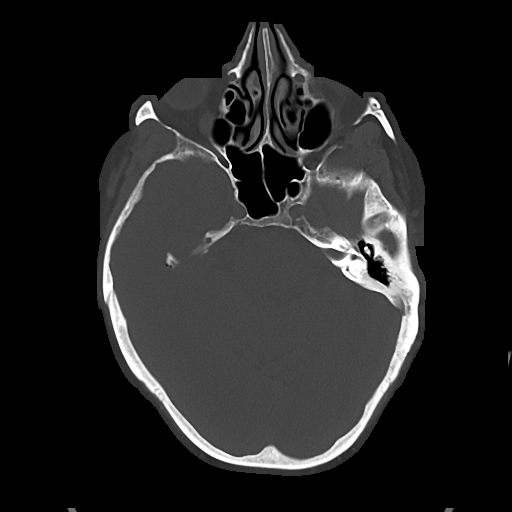
[im 39/86  bone]
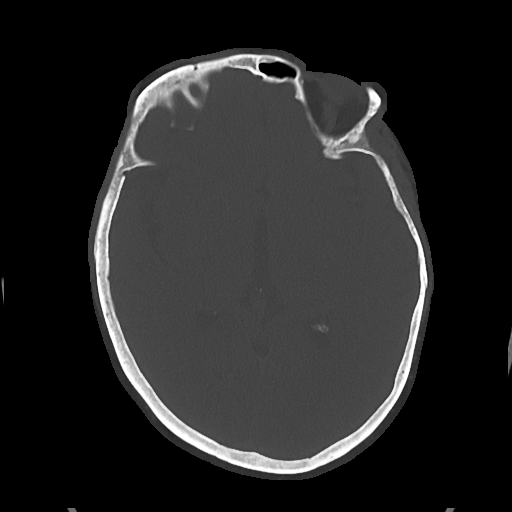

[Series 5: cor soft · coronal · 0.33mm/px · 3 of 67 slices shown]
[im 23/67  brain]
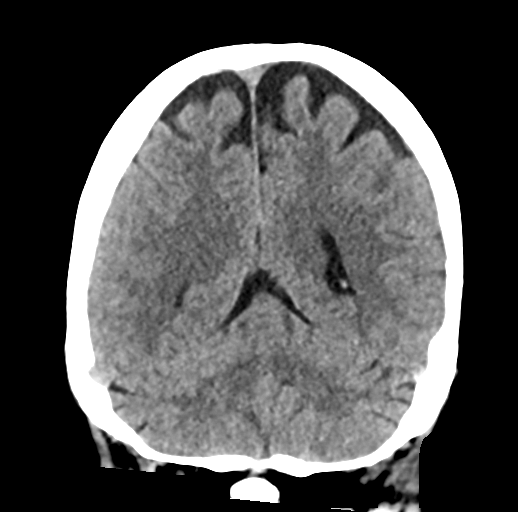
[im 30/67  brain]
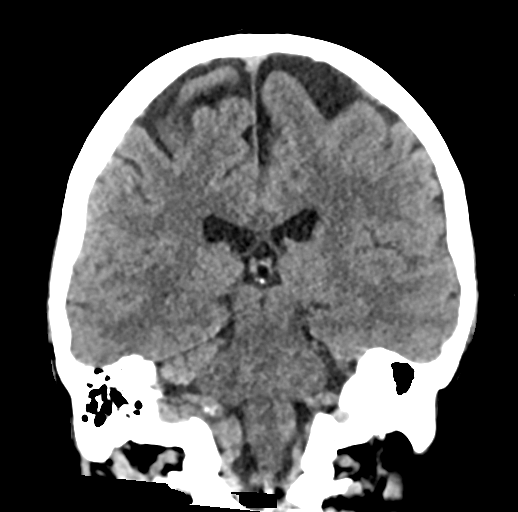
[im 37/67  brain]
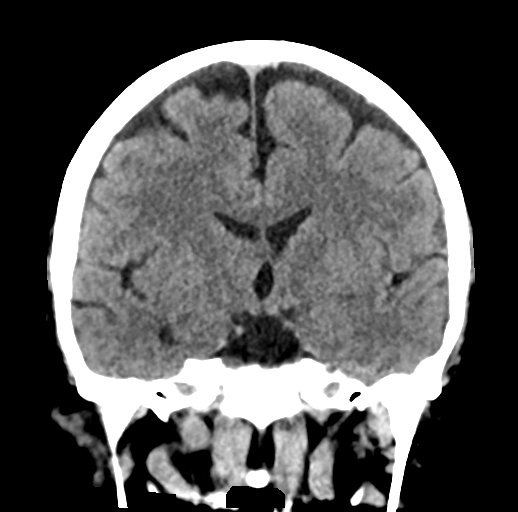

[Series 6: sag soft · sagittal · 0.33mm/px · 3 of 58 slices shown]
[im 20/58  brain]
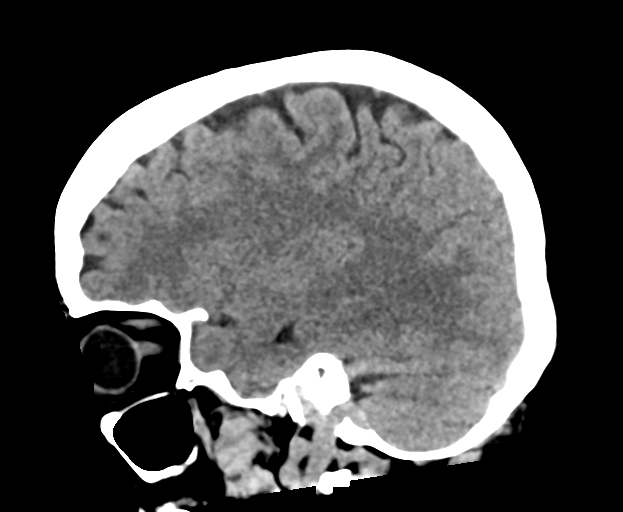
[im 29/58  brain]
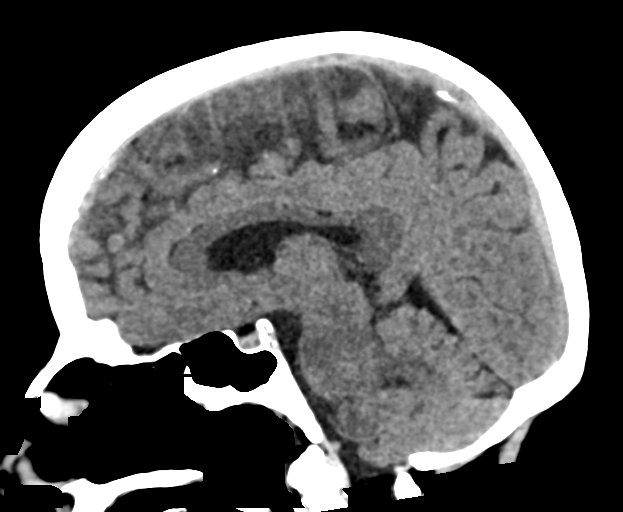
[im 39/58  brain]
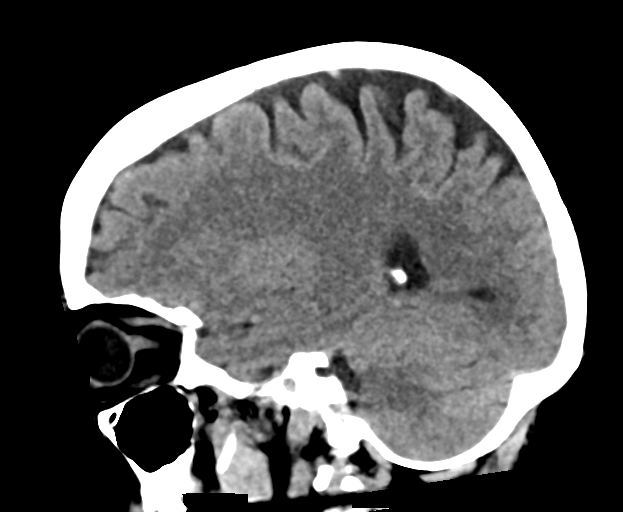

[17 of 47 positions shown; findings below may reference images not displayed]

FINDINGS: Brain: No evidence for acute infarction, hemorrhage, mass lesion,
hydrocephalus, or extra-axial fluid. Mild atrophy. Slight
hypoattenuation of white matter, likely small vessel disease.

Vascular: Calcification of the cavernous internal carotid arteries
consistent with cerebrovascular atherosclerotic disease. No signs of
intracranial large vessel occlusion.

Skull: Calvarium intact.

Sinuses/Orbits: No sinus fluid.  Negative orbits.

Other: None.
IMPRESSION: Atrophy and small vessel disease, relatively mild. No acute
intracranial findings.

## 2019-07-04 ENCOUNTER — Ambulatory Visit (INDEPENDENT_AMBULATORY_CARE_PROVIDER_SITE_OTHER): Payer: Medicare HMO | Admitting: *Deleted

## 2019-07-04 DIAGNOSIS — I495 Sick sinus syndrome: Secondary | ICD-10-CM | POA: Diagnosis not present

## 2019-07-05 DIAGNOSIS — I2510 Atherosclerotic heart disease of native coronary artery without angina pectoris: Secondary | ICD-10-CM | POA: Diagnosis not present

## 2019-07-05 DIAGNOSIS — Z853 Personal history of malignant neoplasm of breast: Secondary | ICD-10-CM | POA: Diagnosis not present

## 2019-07-05 DIAGNOSIS — E039 Hypothyroidism, unspecified: Secondary | ICD-10-CM | POA: Diagnosis not present

## 2019-07-05 DIAGNOSIS — I5032 Chronic diastolic (congestive) heart failure: Secondary | ICD-10-CM | POA: Diagnosis not present

## 2019-07-05 DIAGNOSIS — I10 Essential (primary) hypertension: Secondary | ICD-10-CM | POA: Diagnosis not present

## 2019-07-05 DIAGNOSIS — E782 Mixed hyperlipidemia: Secondary | ICD-10-CM | POA: Diagnosis not present

## 2019-07-05 DIAGNOSIS — J4520 Mild intermittent asthma, uncomplicated: Secondary | ICD-10-CM | POA: Diagnosis not present

## 2019-07-05 DIAGNOSIS — C50919 Malignant neoplasm of unspecified site of unspecified female breast: Secondary | ICD-10-CM | POA: Diagnosis not present

## 2019-07-05 DIAGNOSIS — M8580 Other specified disorders of bone density and structure, unspecified site: Secondary | ICD-10-CM | POA: Diagnosis not present

## 2019-07-06 ENCOUNTER — Telehealth: Payer: Self-pay

## 2019-07-06 NOTE — Telephone Encounter (Signed)
Spoke with patient to remind of missed remote transmission 

## 2019-07-08 LAB — CUP PACEART REMOTE DEVICE CHECK
Battery Impedance: 183 Ohm
Battery Remaining Longevity: 106 mo
Battery Voltage: 2.78 V
Brady Statistic AP VP Percent: 1 %
Brady Statistic AP VS Percent: 78 %
Brady Statistic AS VP Percent: 1 %
Brady Statistic AS VS Percent: 20 %
Date Time Interrogation Session: 20210205153619
Implantable Lead Implant Date: 19970926
Implantable Lead Implant Date: 19970926
Implantable Lead Location: 753859
Implantable Lead Location: 753860
Implantable Lead Model: 4024
Implantable Lead Model: 4524
Implantable Pulse Generator Implant Date: 20171214
Lead Channel Impedance Value: 330 Ohm
Lead Channel Impedance Value: 958 Ohm
Lead Channel Pacing Threshold Amplitude: 0.75 V
Lead Channel Pacing Threshold Amplitude: 1.5 V
Lead Channel Pacing Threshold Pulse Width: 0.4 ms
Lead Channel Pacing Threshold Pulse Width: 0.4 ms
Lead Channel Setting Pacing Amplitude: 2.5 V
Lead Channel Setting Pacing Amplitude: 2.5 V
Lead Channel Setting Pacing Pulse Width: 0.4 ms
Lead Channel Setting Sensing Sensitivity: 5.6 mV

## 2019-07-08 NOTE — Progress Notes (Signed)
PPM Remote  

## 2019-08-08 DIAGNOSIS — J4520 Mild intermittent asthma, uncomplicated: Secondary | ICD-10-CM | POA: Diagnosis not present

## 2019-08-08 DIAGNOSIS — Z853 Personal history of malignant neoplasm of breast: Secondary | ICD-10-CM | POA: Diagnosis not present

## 2019-08-08 DIAGNOSIS — E782 Mixed hyperlipidemia: Secondary | ICD-10-CM | POA: Diagnosis not present

## 2019-08-08 DIAGNOSIS — E039 Hypothyroidism, unspecified: Secondary | ICD-10-CM | POA: Diagnosis not present

## 2019-08-08 DIAGNOSIS — I2510 Atherosclerotic heart disease of native coronary artery without angina pectoris: Secondary | ICD-10-CM | POA: Diagnosis not present

## 2019-08-08 DIAGNOSIS — C50919 Malignant neoplasm of unspecified site of unspecified female breast: Secondary | ICD-10-CM | POA: Diagnosis not present

## 2019-08-08 DIAGNOSIS — I10 Essential (primary) hypertension: Secondary | ICD-10-CM | POA: Diagnosis not present

## 2019-08-08 DIAGNOSIS — I5032 Chronic diastolic (congestive) heart failure: Secondary | ICD-10-CM | POA: Diagnosis not present

## 2019-08-08 DIAGNOSIS — M8580 Other specified disorders of bone density and structure, unspecified site: Secondary | ICD-10-CM | POA: Diagnosis not present

## 2019-09-05 DIAGNOSIS — C50919 Malignant neoplasm of unspecified site of unspecified female breast: Secondary | ICD-10-CM | POA: Diagnosis not present

## 2019-09-05 DIAGNOSIS — I2510 Atherosclerotic heart disease of native coronary artery without angina pectoris: Secondary | ICD-10-CM | POA: Diagnosis not present

## 2019-09-05 DIAGNOSIS — E782 Mixed hyperlipidemia: Secondary | ICD-10-CM | POA: Diagnosis not present

## 2019-09-05 DIAGNOSIS — I5032 Chronic diastolic (congestive) heart failure: Secondary | ICD-10-CM | POA: Diagnosis not present

## 2019-09-05 DIAGNOSIS — M8580 Other specified disorders of bone density and structure, unspecified site: Secondary | ICD-10-CM | POA: Diagnosis not present

## 2019-09-05 DIAGNOSIS — I10 Essential (primary) hypertension: Secondary | ICD-10-CM | POA: Diagnosis not present

## 2019-09-05 DIAGNOSIS — J4520 Mild intermittent asthma, uncomplicated: Secondary | ICD-10-CM | POA: Diagnosis not present

## 2019-09-05 DIAGNOSIS — Z853 Personal history of malignant neoplasm of breast: Secondary | ICD-10-CM | POA: Diagnosis not present

## 2019-09-05 DIAGNOSIS — E039 Hypothyroidism, unspecified: Secondary | ICD-10-CM | POA: Diagnosis not present

## 2019-10-04 ENCOUNTER — Telehealth: Payer: Self-pay

## 2019-10-04 NOTE — Telephone Encounter (Signed)
Left message for patient to remind of missed remote transmission.  

## 2019-10-05 ENCOUNTER — Other Ambulatory Visit: Payer: Self-pay

## 2019-10-05 DIAGNOSIS — C50412 Malignant neoplasm of upper-outer quadrant of left female breast: Secondary | ICD-10-CM

## 2019-10-05 NOTE — Progress Notes (Signed)
No show

## 2019-10-06 ENCOUNTER — Inpatient Hospital Stay: Payer: Medicare HMO | Attending: Oncology | Admitting: Oncology

## 2019-10-06 ENCOUNTER — Encounter: Payer: Self-pay | Admitting: Oncology

## 2019-10-06 ENCOUNTER — Inpatient Hospital Stay: Payer: Medicare HMO

## 2019-10-06 DIAGNOSIS — C50412 Malignant neoplasm of upper-outer quadrant of left female breast: Secondary | ICD-10-CM

## 2019-10-06 DIAGNOSIS — I2510 Atherosclerotic heart disease of native coronary artery without angina pectoris: Secondary | ICD-10-CM | POA: Diagnosis not present

## 2019-10-06 DIAGNOSIS — E039 Hypothyroidism, unspecified: Secondary | ICD-10-CM | POA: Diagnosis not present

## 2019-10-06 DIAGNOSIS — Z170 Estrogen receptor positive status [ER+]: Secondary | ICD-10-CM

## 2019-10-06 DIAGNOSIS — I5032 Chronic diastolic (congestive) heart failure: Secondary | ICD-10-CM | POA: Diagnosis not present

## 2019-10-06 DIAGNOSIS — I10 Essential (primary) hypertension: Secondary | ICD-10-CM | POA: Diagnosis not present

## 2019-10-06 DIAGNOSIS — Z171 Estrogen receptor negative status [ER-]: Secondary | ICD-10-CM

## 2019-10-06 DIAGNOSIS — Z85828 Personal history of other malignant neoplasm of skin: Secondary | ICD-10-CM

## 2019-10-06 DIAGNOSIS — Z853 Personal history of malignant neoplasm of breast: Secondary | ICD-10-CM | POA: Diagnosis not present

## 2019-10-06 DIAGNOSIS — C50919 Malignant neoplasm of unspecified site of unspecified female breast: Secondary | ICD-10-CM | POA: Diagnosis not present

## 2019-10-06 DIAGNOSIS — M8580 Other specified disorders of bone density and structure, unspecified site: Secondary | ICD-10-CM | POA: Diagnosis not present

## 2019-10-06 DIAGNOSIS — J4520 Mild intermittent asthma, uncomplicated: Secondary | ICD-10-CM | POA: Diagnosis not present

## 2019-10-06 DIAGNOSIS — I495 Sick sinus syndrome: Secondary | ICD-10-CM

## 2019-10-06 DIAGNOSIS — E782 Mixed hyperlipidemia: Secondary | ICD-10-CM | POA: Diagnosis not present

## 2019-11-30 DIAGNOSIS — J4520 Mild intermittent asthma, uncomplicated: Secondary | ICD-10-CM | POA: Diagnosis not present

## 2019-11-30 DIAGNOSIS — E039 Hypothyroidism, unspecified: Secondary | ICD-10-CM | POA: Diagnosis not present

## 2019-11-30 DIAGNOSIS — E782 Mixed hyperlipidemia: Secondary | ICD-10-CM | POA: Diagnosis not present

## 2019-11-30 DIAGNOSIS — I2510 Atherosclerotic heart disease of native coronary artery without angina pectoris: Secondary | ICD-10-CM | POA: Diagnosis not present

## 2019-11-30 DIAGNOSIS — C50919 Malignant neoplasm of unspecified site of unspecified female breast: Secondary | ICD-10-CM | POA: Diagnosis not present

## 2019-11-30 DIAGNOSIS — I10 Essential (primary) hypertension: Secondary | ICD-10-CM | POA: Diagnosis not present

## 2019-11-30 DIAGNOSIS — M8580 Other specified disorders of bone density and structure, unspecified site: Secondary | ICD-10-CM | POA: Diagnosis not present

## 2019-11-30 DIAGNOSIS — I5032 Chronic diastolic (congestive) heart failure: Secondary | ICD-10-CM | POA: Diagnosis not present

## 2019-11-30 DIAGNOSIS — Z853 Personal history of malignant neoplasm of breast: Secondary | ICD-10-CM | POA: Diagnosis not present

## 2019-12-02 DIAGNOSIS — L989 Disorder of the skin and subcutaneous tissue, unspecified: Secondary | ICD-10-CM | POA: Diagnosis not present

## 2019-12-02 DIAGNOSIS — M542 Cervicalgia: Secondary | ICD-10-CM | POA: Diagnosis not present

## 2019-12-04 ENCOUNTER — Encounter (HOSPITAL_BASED_OUTPATIENT_CLINIC_OR_DEPARTMENT_OTHER): Payer: Self-pay

## 2019-12-04 ENCOUNTER — Emergency Department (HOSPITAL_BASED_OUTPATIENT_CLINIC_OR_DEPARTMENT_OTHER): Payer: Medicare HMO

## 2019-12-04 ENCOUNTER — Other Ambulatory Visit: Payer: Self-pay

## 2019-12-04 ENCOUNTER — Emergency Department (HOSPITAL_BASED_OUTPATIENT_CLINIC_OR_DEPARTMENT_OTHER)
Admission: EM | Admit: 2019-12-04 | Discharge: 2019-12-04 | Disposition: A | Discharge status: Home / Self Care | Payer: Medicare HMO | Attending: Emergency Medicine | Admitting: Emergency Medicine

## 2019-12-04 DIAGNOSIS — E039 Hypothyroidism, unspecified: Secondary | ICD-10-CM | POA: Insufficient documentation

## 2019-12-04 DIAGNOSIS — R519 Headache, unspecified: Secondary | ICD-10-CM | POA: Insufficient documentation

## 2019-12-04 DIAGNOSIS — Z79890 Hormone replacement therapy (postmenopausal): Secondary | ICD-10-CM | POA: Insufficient documentation

## 2019-12-04 DIAGNOSIS — Z85828 Personal history of other malignant neoplasm of skin: Secondary | ICD-10-CM | POA: Diagnosis not present

## 2019-12-04 DIAGNOSIS — I6523 Occlusion and stenosis of bilateral carotid arteries: Secondary | ICD-10-CM | POA: Diagnosis not present

## 2019-12-04 DIAGNOSIS — I7090 Unspecified atherosclerosis: Secondary | ICD-10-CM | POA: Diagnosis not present

## 2019-12-04 DIAGNOSIS — M47892 Other spondylosis, cervical region: Secondary | ICD-10-CM | POA: Diagnosis not present

## 2019-12-04 DIAGNOSIS — I110 Hypertensive heart disease with heart failure: Secondary | ICD-10-CM | POA: Diagnosis not present

## 2019-12-04 DIAGNOSIS — I5032 Chronic diastolic (congestive) heart failure: Secondary | ICD-10-CM | POA: Diagnosis not present

## 2019-12-04 DIAGNOSIS — M542 Cervicalgia: Secondary | ICD-10-CM

## 2019-12-04 DIAGNOSIS — M4692 Unspecified inflammatory spondylopathy, cervical region: Secondary | ICD-10-CM | POA: Insufficient documentation

## 2019-12-04 DIAGNOSIS — Z79899 Other long term (current) drug therapy: Secondary | ICD-10-CM | POA: Insufficient documentation

## 2019-12-04 DIAGNOSIS — Z7951 Long term (current) use of inhaled steroids: Secondary | ICD-10-CM | POA: Insufficient documentation

## 2019-12-04 DIAGNOSIS — M25511 Pain in right shoulder: Secondary | ICD-10-CM | POA: Diagnosis not present

## 2019-12-04 DIAGNOSIS — Z853 Personal history of malignant neoplasm of breast: Secondary | ICD-10-CM | POA: Diagnosis not present

## 2019-12-04 DIAGNOSIS — M47812 Spondylosis without myelopathy or radiculopathy, cervical region: Secondary | ICD-10-CM

## 2019-12-04 LAB — BASIC METABOLIC PANEL
Anion gap: 10 (ref 5–15)
BUN: 16 mg/dL (ref 8–23)
CO2: 24 mmol/L (ref 22–32)
Calcium: 8.8 mg/dL — ABNORMAL LOW (ref 8.9–10.3)
Chloride: 103 mmol/L (ref 98–111)
Creatinine, Ser: 0.73 mg/dL (ref 0.44–1.00)
GFR calc Af Amer: >60 mL/min (ref >60)
GFR calc non Af Amer: >60 mL/min (ref >60)
Glucose, Bld: 98 mg/dL (ref 70–99)
Potassium: 3.5 mmol/L (ref 3.5–5.1)
Sodium: 137 mmol/L (ref 135–145)

## 2019-12-04 LAB — CBC WITH DIFFERENTIAL/PLATELET
Abs Immature Granulocytes: 0.02 10*3/uL (ref 0.00–0.07)
Basophils Absolute: 0 10*3/uL (ref 0.0–0.1)
Basophils Relative: 1 %
Eosinophils Absolute: 0.2 10*3/uL (ref 0.0–0.5)
Eosinophils Relative: 3 %
HCT: 39.1 % (ref 36.0–46.0)
Hemoglobin: 13.2 g/dL (ref 12.0–15.0)
Immature Granulocytes: 0 %
Lymphocytes Relative: 18 %
Lymphs Abs: 1.2 10*3/uL (ref 0.7–4.0)
MCH: 29.6 pg (ref 26.0–34.0)
MCHC: 33.8 g/dL (ref 30.0–36.0)
MCV: 87.7 fL (ref 80.0–100.0)
Monocytes Absolute: 0.5 10*3/uL (ref 0.1–1.0)
Monocytes Relative: 8 %
Neutro Abs: 4.6 10*3/uL (ref 1.7–7.7)
Neutrophils Relative %: 70 %
Platelets: 284 10*3/uL (ref 150–400)
RBC: 4.46 MIL/uL (ref 3.87–5.11)
RDW: 13 % (ref 11.5–15.5)
WBC: 6.5 10*3/uL (ref 4.0–10.5)
nRBC: 0 % (ref 0.0–0.2)

## 2019-12-04 LAB — LACTIC ACID, PLASMA: Lactic Acid, Venous: 0.7 mmol/L (ref 0.5–1.9)

## 2019-12-04 MED ORDER — IOHEXOL 300 MG/ML  SOLN
100.0000 mL | Freq: Once | INTRAMUSCULAR | Status: AC | PRN
  Administered 2019-12-04: 75 mL via INTRAVENOUS

## 2019-12-04 MED ORDER — ONDANSETRON HCL 4 MG PO TABS: 4.0000 mg | ORAL_TABLET | Freq: Three times a day (TID) | ORAL | 0 refills | Status: AC | PRN

## 2019-12-04 MED ORDER — SODIUM CHLORIDE 0.9 % IV BOLUS
1000.0000 mL | Freq: Once | INTRAVENOUS | Status: AC
  Administered 2019-12-04: 1000 mL via INTRAVENOUS

## 2019-12-04 MED ORDER — METOCLOPRAMIDE HCL 5 MG/ML IJ SOLN
10.0000 mg | Freq: Once | INTRAMUSCULAR | Status: AC
  Administered 2019-12-04: 10 mg via INTRAVENOUS
  Filled 2019-12-04: qty 2

## 2019-12-04 MED ORDER — ONDANSETRON HCL 4 MG/2ML IJ SOLN
4.0000 mg | Freq: Once | INTRAMUSCULAR | Status: AC
  Administered 2019-12-04: 4 mg via INTRAVENOUS
  Filled 2019-12-04: qty 2

## 2019-12-04 MED ORDER — DIPHENHYDRAMINE HCL 50 MG/ML IJ SOLN
12.5000 mg | Freq: Once | INTRAMUSCULAR | Status: AC
  Administered 2019-12-04: 12.5 mg via INTRAVENOUS
  Filled 2019-12-04: qty 1

## 2019-12-04 NOTE — ED Triage Notes (Signed)
Pt had been dealing with neck and shoulder pain for several weeks. On Friday pt had XRs taken of her c-spine and R shoulder and pt was started on prednisone. Pt states last PM she was notified of the results and was told there was some soft tissue abnormality. Pt was advised to stop the prednisone and go to the ED if she developed a HA. Today pt started having a HA with nausea.

## 2019-12-04 NOTE — ED Provider Notes (Signed)
[facility] EMERGENCY DEPARTMENT Provider Note   CSN: [id number] Arrival date & time: 12/04/19  1923     History Chief Complaint  Patient presents with  . Headache    [name] is a 74 y.o. female.  She has had pain in her neck in both her shoulders that is been going on for at least 2 weeks.  Right greater than left.  Worse with any movement.  She said the doctor put her on some prednisone and ordered her some x-rays of her neck and shoulders.  She received a call last evening that her x-ray was abnormal and that she should stop the prednisone.  Recommended she be seen in the emergency department if she experienced a headache or any other symptoms.  She said her neck was bothering her badly today and then it turned into a headache.  It was associated with some nausea.  No fever.  Because of her doctor's instructions she presented to the emergency department.  The history is provided by the patient.  Headache Pain location:  Generalized Quality:  Dull Radiates to:  R neck and L neck Severity currently:  8/10 Severity at highest:  8/10 Onset quality:  Gradual Timing:  Constant Progression:  Unchanged Chronicity:  New Relieved by:  Nothing Worsened by:  Nothing Ineffective treatments:  None tried Associated symptoms: nausea, neck pain and neck stiffness   Associated symptoms: no abdominal pain, no cough, no diarrhea, no ear pain, no eye pain, no facial pain, no fever, no focal weakness, no hearing loss, no loss of balance, no photophobia, no syncope, no visual change and no weakness        Past Medical History:  Diagnosis Date  . Allergic rhinitis   . Arthritis   . Family history of breast cancer   . Family history of colon cancer   . Family history of pancreatic cancer   . GERD (gastroesophageal reflux disease)   . GI bleed   . History of basal cell cancer   . History of radiation therapy 08/05/2016 - 09/12/2016   Left Breast 50.4 Gy 28 fractions  .  Hyperlipidemia   . Hypertension   . Hypothyroidism   . Lumbar disc disease   . Malignant neoplasm of upper-outer quadrant of left female breast ([place]) 04/11/2016  . Pacemaker   . Peptic ulcer disease   . SSS (sick sinus syndrome) ([place])   . Thyroid disease     Patient Active Problem List   Diagnosis Date Noted  . Chronic diastolic heart failure ([place]) 11/04/2017  . Coronary artery disease involving native coronary artery of native heart without angina pectoris 11/04/2017  . Angina pectoris ([place]) 10/09/2017  . Dyspnea 10/09/2017  . Altered mental state 10/09/2017  . Thyroid disease   . SSS (sick sinus syndrome) ([place])   . Peptic ulcer disease   . Pacemaker   . Lumbar disc disease   . Hypothyroidism   . Hypertension   . History of radiation therapy   . History of basal cell cancer   . GERD (gastroesophageal reflux disease)   . GI bleed   . Arthritis   . Allergic rhinitis   . Genetic testing 05/01/2016  . Family history of breast cancer   . Family history of pancreatic cancer   . Family history of colon cancer   . Malignant neoplasm of upper-outer quadrant of left breast in female, estrogen receptor negative ([place]) 04/11/2016  . Malignant neoplasm of upper-outer quadrant of left female  breast ([place]) 04/11/2016  . Essential hypertension 09/06/2013  . Sick sinus syndrome ([place]) 09/06/2013  . History of permanent cardiac pacemaker placement 09/06/2013  . Hyperlipidemia 09/06/2013    Past Surgical History:  Procedure Laterality Date  . ABDOMINAL HYSTERECTOMY    . BREAST LUMPECTOMY WITH RADIOACTIVE SEED AND SENTINEL LYMPH NODE BIOPSY Left 06/06/2016   Procedure: LEFT BREAST LUMPECTOMY WITH RADIOACTIVE SEED AND LEFT AXILLARY SENTINEL LYMPH NODE BIOPSY;  Surgeon: Alphonsa Overall, MD;  Location: [facility];  Service: General;  Laterality: Left;  . BREAST SURGERY     left  . EP IMPLANTABLE DEVICE N/A 05/15/2016   Procedure: PPM Generator Changeout;  Surgeon: Evans Lance, MD;  Location: [facility] [facility];  Service: Cardiovascular;  Laterality: N/A;  . HERNIA REPAIR    . LEFT HEART CATH AND CORONARY ANGIOGRAPHY N/A 10/09/2017   Procedure: LEFT HEART CATH AND CORONARY ANGIOGRAPHY;  Surgeon: Belva Crome, MD;  Location: [facility];  Service: Cardiovascular;  Laterality: N/A;  . PACEMAKER INSERTION       OB History   No obstetric history on file.     Family History  Problem Relation Age of Onset  . Congestive Heart Failure Mother   . Hypertension Father   . Bone cancer Father   . Breast cancer Sister 57  . Bradycardia Cousin   . Breast cancer Cousin 36  . Breast cancer Cousin 37  . Colon cancer Cousin 60  . Pancreatic cancer Paternal Aunt 76  . Rectal cancer Paternal Aunt 30  . Heart attack Maternal Grandmother   . Heart attack Paternal Grandmother   . Stroke Paternal Grandfather   . Melanoma Daughter        dx in her 43s    Social History   Tobacco Use  . Smoking status: Never Smoker  . Smokeless tobacco: Never Used  Vaping Use  . Vaping Use: Never used  Substance Use Topics  . Alcohol use: No  . Drug use: No    Home Medications Prior to Admission medications   Medication Sig Start Date End Date Taking? Authorizing Provider  albuterol (PROVENTIL HFA;VENTOLIN HFA) 108 (90 BASE) MCG/ACT inhaler Inhale 2 puffs into the lungs every 6 (six) hours as needed for wheezing or shortness of breath.     [provider]  albuterol (PROVENTIL) (2.5 MG/3ML) 0.083% nebulizer solution Take 2.5 mg by nebulization every 6 (six) hours as needed for wheezing or shortness of breath.    [provider]  atorvastatin (LIPITOR) 80 MG tablet Take 1 tablet (80 mg total) by mouth daily. 11/04/17 12/08/18  Belva Crome, MD  levothyroxine (SYNTHROID, LEVOTHROID) 100 MCG tablet Take 100 mcg by mouth daily before breakfast.    [provider]  mometasone (NASONEX) 50 MCG/ACT nasal spray Place 2 sprays into the nose daily.    [provider]    Omega-3 Fatty Acids (FISH OIL) 1000 MG CAPS Take 1,000 mg by mouth daily.    [provider]  omeprazole (PRILOSEC) 20 MG capsule Take 20 mg by mouth daily.    [provider]  tamoxifen (NOLVADEX) 20 MG tablet Take 1 tablet (20 mg total) by mouth daily. 02/11/19   Magrinat, Virgie Dad, MD  triamterene-hydrochlorothiazide (MAXZIDE) 75-50 MG tablet Take 1 tablet by mouth every evening.     [provider]  venlafaxine XR (EFFEXOR-XR) 37.5 MG 24 hr capsule Take 1 capsule (37.5 mg total) by mouth daily with breakfast. 01/06/19   Magrinat,  Virgie Dad, MD    Allergies    Bee venom, Morphine and related, Nsaids, Wasp venom, Penicillins, and Sulfa antibiotics  Review of Systems   Review of Systems  Constitutional: Negative for fever.  HENT: Negative for ear pain and hearing loss.   Eyes: Negative for photophobia and pain.  Respiratory: Negative for cough.   Cardiovascular: Negative for chest pain and syncope.  Gastrointestinal: Positive for nausea. Negative for abdominal pain and diarrhea.  Genitourinary: Negative for dysuria.  Musculoskeletal: Positive for neck pain and neck stiffness.  Skin: Negative for rash.  Neurological: Positive for headaches. Negative for focal weakness, weakness and loss of balance.    Physical Exam Updated Vital Signs BP (!) 184/67 (BP Location: Right Arm)   Pulse (!) 59   Temp 97.9 F (36.6 C) (Oral)   Resp 20   Ht 5' 2.5" (1.588 m)   Wt 68 kg   SpO2 97%   BMI 27.00 kg/m   Physical Exam Vitals and nursing note reviewed.  Constitutional:      General: She is not in acute distress.    Appearance: She is well-developed.  HENT:     Head: Normocephalic and atraumatic.  Eyes:     Conjunctiva/sclera: Conjunctivae normal.  Neck:     Comments: She has diffuse tenderness through her posterior neck and into her trapezius bilaterally. Cardiovascular:     Rate and Rhythm: Normal rate and regular rhythm.     Heart sounds: No murmur heard.    Pulmonary:     Effort: Pulmonary effort is normal. No respiratory distress.     Breath sounds: Normal breath sounds.  Abdominal:     Palpations: Abdomen is soft.     Tenderness: There is no abdominal tenderness.  Musculoskeletal:     Cervical back: Tenderness present.  Skin:    General: Skin is warm and dry.  Neurological:     General: No focal deficit present.     Mental Status: She is alert and oriented to person, place, and time.     GCS: GCS eye subscore is 4. GCS verbal subscore is 5. GCS motor subscore is 6.     Cranial Nerves: No cranial nerve deficit or dysarthria.     Sensory: No sensory deficit.     Motor: No weakness.     ED Results / Procedures / Treatments   Labs (all labs ordered are listed, but only abnormal results are displayed) Labs Reviewed  BASIC METABOLIC PANEL - Abnormal; Notable for the following components:      Result Value   Calcium 8.8 (*)    All other components within normal limits  CULTURE, BLOOD (ROUTINE X 2)  CULTURE, BLOOD (ROUTINE X 2)  CBC WITH DIFFERENTIAL/PLATELET  LACTIC ACID, PLASMA    EKG None  Radiology CT Head Wo Contrast  Result Date: 12/04/2019 CLINICAL DATA:  Headache, new or worsening EXAM: CT HEAD WITHOUT CONTRAST TECHNIQUE: Contiguous axial images were obtained from the base of the skull through the vertex without intravenous contrast. COMPARISON:  CT head 10/09/2017 FINDINGS: Brain: No evidence of acute infarction, hemorrhage, hydrocephalus, extra-axial collection or mass lesion/mass effect. Patchy areas of white matter hypoattenuation are most compatible with chronic microvascular angiopathy. Symmetric prominence of the ventricles, cisterns and sulci compatible with parenchymal volume loss. Vascular: Atherosclerotic calcification of the carotid siphons. No hyperdense vessel. Skull: No calvarial fracture or suspicious osseous lesion. No scalp swelling or hematoma. Sinuses/Orbits: Trace right mastoid effusion. Hypo  pneumatization of the left mastoid air  cells and frontal sinuses. Remaining paranasal sinuses are predominantly clear. Orbital structures are unremarkable aside from prior lens extractions. Other: None IMPRESSION: 1. No acute intracranial abnormality. 2. Mild stable chronic microvascular angiopathy and parenchymal volume loss. 3. Trace right mastoid effusion. Electronically Signed   By: Lovena Le M.D.   On: 12/04/2019 21:44   CT Soft Tissue Neck W Contrast  Result Date: 12/04/2019 CLINICAL DATA:  Provided history: Neck pain, acute, infection suspected. Additional history provided: Venous, posterior headache, right shoulder/neck pain. EXAM: CT NECK WITH CONTRAST TECHNIQUE: Multidetector CT imaging of the neck was performed using the standard protocol following the bolus administration of intravenous contrast. CONTRAST:  53mL OMNIPAQUE IOHEXOL 300 MG/ML  SOLN COMPARISON:  CT angiogram neck 10/10/2017 FINDINGS: Pharynx and larynx: Streak artifact from dental restoration limits evaluation of the oral cavity. Within this limitation, there is no appreciable swelling or discrete mass within the oral cavity, pharynx or larynx. No retropharyngeal edema or fluid collection is demonstrated. Salivary glands: No inflammation, mass, or stone. Thyroid: Thyroid unremarkable. Lymph nodes: No pathologically enlarged cervical chain lymph nodes. Vascular: The major vascular structures of the neck are patent. Calcified atherosclerotic plaque within the visualized aortic arch and proximal major branch vessels of the neck. Mixed plaque is also present within the carotid bifurcations and proximal internal carotid arteries bilaterally. Limited intracranial: No acute intracranial abnormality identified. Visualized orbits: No acute abnormality Mastoids and visualized paranasal sinuses: No significant paranasal sinus disease or mastoid effusion at the imaged levels. Skeleton: No acute bony abnormality or aggressive osseous lesion.  Reversal of the expected cervical lordosis. Cervical spondylosis with multilevel disc space narrowing, posterior disc osteophytes, uncovertebral and facet hypertrophy. There is no high-grade bony spinal canal stenosis. Multilevel bony neural foraminal narrowing greatest on the left at C5-C6 and bilaterally at C6-C7. Upper chest: No consolidation within the imaged lung apices. Right chest dual lead pacer/AICD device, partially imaged. IMPRESSION: No soft tissue swelling or evidence of an acute inflammatory process within the neck. Cervical spondylosis as described. Multilevel bony neural foraminal narrowing, greatest on the left at C5-C6 and bilaterally at C6-C7. Atherosclerotic plaque within the visualized aortic arch, proximal major branch vessels of the neck, carotid bifurcations and proximal internal carotid arteries. Electronically Signed   By: Kellie Simmering DO   On: 12/04/2019 22:11    Procedures Procedures (including critical care time)  Medications Ordered in ED Medications  ondansetron (ZOFRAN) injection 4 mg (has no administration in time range)  sodium chloride 0.9 % bolus 1,000 mL (has no administration in time range)    ED Course  I have reviewed the triage vital signs and the nursing notes.  Pertinent labs & imaging results that were available during my care of the patient were reviewed by me and considered in my medical decision making (see chart for details).  Clinical Course as of Dec 05 1134  Sun Dec 04, 2019  2014 Discussed with [facility] primary care Dr. Zadie Rhine.  She was able to relate to me the imaging concern.  Patient had a plain x-rays of her cervical spine that showed some soft tissue thickening in the anterior prevertebral space between C4 and C6.  On the differential would be abscess.  They were setting her up for an outpatient CT with contrast.   [MB]  2228 Patient CT neck and head did not show any obvious infectious process to explain her symptoms.  She does have a lot of  arthritis changes and some foraminal narrowing.  I reviewed this  with her and her brother.  She is comfortable with the plan of restarting her prednisone and following up with her primary care doctor.  Return instructions discussed.   [MB]    Clinical Course User Index [MB] Hayden Rasmussen, MD   MDM Rules/Calculators/A&P                         This patient complains of neck pain headache and nausea and abdomen x-ray; this involves an extensive number of treatment Options and is a complaint that carries with it a high risk of complications and Morbidity. The differential includes migraine, musculoskeletal pain, tension headache, retroperitoneal abscess, meningitis  I ordered, reviewed and interpreted labs, which included CBC with normal white count normal hemoglobin, chemistries normal other than a mildly low calcium of 8.8, lactic acid normal, blood cultures taken and pending I ordered medication IV fluids Zofran diphenhydramine and Reglan with improvement in her symptoms I ordered imaging studies which included CT head and CT neck with IV contrast and I independently    visualized and interpreted imaging which showed degenerative changes but no other significant findings Additional history obtained from patient's brother Previous records obtained and reviewed in epic and also placed a call to [facility] physician Dr. Zadie Rhine who was able to relay patient's Friday visit with PCP and abnormality identified on x-rays.  After the interventions stated above, I reevaluated the patient and found patient symptoms to be improved.  I reviewed her work-up with her and her brother.  They are comfortable with plan for continued pain control and outpatient follow-up.  Return instructions discussed.  Final Clinical Impression(s) / ED Diagnoses Final diagnoses:  Bad headache  Neck pain, acute  Cervical spine arthritis    Rx / DC Orders ED Discharge Orders         Ordered    ondansetron (ZOFRAN) 4 MG  tablet  Every 8 hours PRN     Discontinue  Reprint     12/04/19 2231           Hayden Rasmussen, MD 12/05/19 1139

## 2019-12-04 NOTE — Discharge Instructions (Signed)
You were seen in the emergency department for continued neck pain and new headache and nausea.  You had blood work that was unremarkable and a CAT scan of your head that did not show any significant findings.  The CAT scan of your neck showed some arthritis changes in your neck but no obvious abscess.  Please continue your medications and follow-up with your primary care doctor.  Return to the emergency department if any fever or worsening or concerning symptoms.

## 2019-12-07 ENCOUNTER — Other Ambulatory Visit: Payer: Self-pay | Admitting: Family Medicine

## 2019-12-07 DIAGNOSIS — I6529 Occlusion and stenosis of unspecified carotid artery: Secondary | ICD-10-CM

## 2019-12-09 LAB — CULTURE, BLOOD (ROUTINE X 2)
Culture: NO GROWTH
Culture: NO GROWTH
Special Requests: ADEQUATE
Special Requests: ADEQUATE

## 2019-12-13 ENCOUNTER — Ambulatory Visit
Admission: RE | Admit: 2019-12-13 | Discharge: 2019-12-13 | Disposition: A | Discharge status: Home / Self Care | Payer: Medicare HMO | Source: Ambulatory Visit | Attending: Family Medicine | Admitting: Family Medicine

## 2019-12-13 DIAGNOSIS — I771 Stricture of artery: Secondary | ICD-10-CM | POA: Diagnosis not present

## 2019-12-13 DIAGNOSIS — I6529 Occlusion and stenosis of unspecified carotid artery: Secondary | ICD-10-CM

## 2019-12-13 DIAGNOSIS — I6523 Occlusion and stenosis of bilateral carotid arteries: Secondary | ICD-10-CM | POA: Diagnosis not present

## 2019-12-16 DIAGNOSIS — I5032 Chronic diastolic (congestive) heart failure: Secondary | ICD-10-CM | POA: Diagnosis not present

## 2019-12-16 DIAGNOSIS — E782 Mixed hyperlipidemia: Secondary | ICD-10-CM | POA: Diagnosis not present

## 2019-12-16 DIAGNOSIS — M8580 Other specified disorders of bone density and structure, unspecified site: Secondary | ICD-10-CM | POA: Diagnosis not present

## 2019-12-16 DIAGNOSIS — E039 Hypothyroidism, unspecified: Secondary | ICD-10-CM | POA: Diagnosis not present

## 2019-12-16 DIAGNOSIS — Z853 Personal history of malignant neoplasm of breast: Secondary | ICD-10-CM | POA: Diagnosis not present

## 2019-12-16 DIAGNOSIS — C50919 Malignant neoplasm of unspecified site of unspecified female breast: Secondary | ICD-10-CM | POA: Diagnosis not present

## 2019-12-16 DIAGNOSIS — I2510 Atherosclerotic heart disease of native coronary artery without angina pectoris: Secondary | ICD-10-CM | POA: Diagnosis not present

## 2019-12-16 DIAGNOSIS — J4520 Mild intermittent asthma, uncomplicated: Secondary | ICD-10-CM | POA: Diagnosis not present

## 2019-12-16 DIAGNOSIS — I10 Essential (primary) hypertension: Secondary | ICD-10-CM | POA: Diagnosis not present

## 2019-12-19 ENCOUNTER — Telehealth: Payer: Self-pay | Admitting: Interventional Cardiology

## 2019-12-19 NOTE — Telephone Encounter (Signed)
° ° ° °  Pt's pcp Dr. [name] did and ultrasound and found calcification in pt's coronary artery and heart valve. She said per Dr. Rex Kras to call Dr. Tamala Julian and make an appt she might need another CT angiogram another echo. Per pcp he will send DR. Tamala Julian copy of ultrasound

## 2019-12-19 NOTE — Telephone Encounter (Signed)
Pt has appt with Dr. Tamala Julian tomorrow.  Carotid doppler in our system from 7/13 ordered by Dr.Little.

## 2019-12-20 ENCOUNTER — Ambulatory Visit: Payer: Medicare HMO | Admitting: Interventional Cardiology

## 2019-12-20 ENCOUNTER — Encounter: Payer: Self-pay | Admitting: Interventional Cardiology

## 2019-12-20 ENCOUNTER — Other Ambulatory Visit: Payer: Self-pay

## 2019-12-20 VITALS — BP 138/72 | HR 65 | Ht 62.5 in | Wt 148.0 lb

## 2019-12-20 DIAGNOSIS — Z7189 Other specified counseling: Secondary | ICD-10-CM

## 2019-12-20 DIAGNOSIS — I5032 Chronic diastolic (congestive) heart failure: Secondary | ICD-10-CM | POA: Diagnosis not present

## 2019-12-20 DIAGNOSIS — I447 Left bundle-branch block, unspecified: Secondary | ICD-10-CM | POA: Diagnosis not present

## 2019-12-20 DIAGNOSIS — I700 Atherosclerosis of aorta: Secondary | ICD-10-CM | POA: Diagnosis not present

## 2019-12-20 DIAGNOSIS — I495 Sick sinus syndrome: Secondary | ICD-10-CM

## 2019-12-20 DIAGNOSIS — E785 Hyperlipidemia, unspecified: Secondary | ICD-10-CM

## 2019-12-20 DIAGNOSIS — I2510 Atherosclerotic heart disease of native coronary artery without angina pectoris: Secondary | ICD-10-CM

## 2019-12-20 DIAGNOSIS — I6523 Occlusion and stenosis of bilateral carotid arteries: Secondary | ICD-10-CM | POA: Diagnosis not present

## 2019-12-20 MED ORDER — ASPIRIN EC 81 MG PO TBEC: 81.0000 mg | DELAYED_RELEASE_TABLET | ORAL | 3 refills | Status: AC

## 2019-12-20 NOTE — Patient Instructions (Addendum)
Medication Instructions:  1) START Aspirin 81mg  once daily on Monday, Wednesday and Friday.  *If you need a refill on your cardiac medications before your next appointment, please call your pharmacy*   Lab Work: None If you have labs (blood work) drawn today and your tests are completely normal, you will receive your results only by: Marland Kitchen MyChart Message (if you have MyChart) OR . A paper copy in the mail If you have any lab test that is abnormal or we need to change your treatment, we will call you to review the results.   Testing/Procedures: None   Follow-Up: At [facility], you and your health needs are our priority.  As part of our continuing mission to provide you with exceptional heart care, we have created designated Provider Care Teams.  These Care Teams include your primary Cardiologist (physician) and Advanced Practice Providers (APPs -  Physician Assistants and Nurse Practitioners) who all work together to provide you with the care you need, when you need it.  We recommend signing up for the patient portal called "MyChart".  Sign up information is provided on this After Visit Summary.  MyChart is used to connect with patients for Virtual Visits (Telemedicine).  Patients are able to view lab/test results, encounter notes, upcoming appointments, etc.  Non-urgent messages can be sent to your provider as well.   To learn more about what you can do with MyChart, go to [email or web address].    Your next appointment:   12 month(s)  The format for your next appointment:   In Person  Provider:   You may see [name], MD or one of the following Advanced Practice Providers on your designated Care Team:    Truitt Merle, NP  Cecilie Kicks, NP  Kathyrn Drown, NP    Other Instructions  Your physician discussed the importance of regular exercise and recommended that you start or continue a regular exercise program for good health.  Your provider recommends that you  maintain 150 minutes per week of moderate aerobic activity.  Your goal LDL (bad) cholesterol is 70 or less. When you have this checked at Dr. Eddie Dibbles office, please let us know if it is higher than that.

## 2019-12-20 NOTE — Progress Notes (Signed)
Cardiology Office Note:    Date:  12/20/2019   ID:  [name], DOB 1946-04-26, MRN [id number]  PCP:  [name], MD  Cardiologist:  [name], MD   Referring MD: [name], MD   Chief Complaint  Patient presents with  . Coronary Artery Disease  . Carotid    Atherosclerosis noted on Doppler imaging but no significant obstruction    History of Present Illness:    [name] is a 74 y.o. female with a hx of  hyperlipidemia,  family history of CAD, chronic diastolic HF, non obstructive CAD, essential hypertension, sick sinus syndrome and permanent pacemaker, history of breast cancer, aortic atherosclerosis, and bilateral carotid and intracranial vascular calcification without significant obstruction by recent CT 2021.   Asymptomatic with reference to coronary disease and vascular disease.  Became concerned by the finding of diffuse atherosclerosis involving carotids, aortic, and known nonobstructive coronary disease.  Referred to discuss risk modification.  Her risk factors include hypertension, family history, hyperlipidemia, and family history CAD.  She is not having angina.  No neurological complaints.  She denies claudication.  Past Medical History:  Diagnosis Date  . Allergic rhinitis   . Arthritis   . Family history of breast cancer   . Family history of colon cancer   . Family history of pancreatic cancer   . GERD (gastroesophageal reflux disease)   . GI bleed   . History of basal cell cancer   . History of radiation therapy 08/05/2016 - 09/12/2016   Left Breast 50.4 Gy 28 fractions  . Hyperlipidemia   . Hypertension   . Hypothyroidism   . Lumbar disc disease   . Malignant neoplasm of upper-outer quadrant of left female breast ([place]) 04/11/2016  . Pacemaker   . Peptic ulcer disease   . SSS (sick sinus syndrome) ([place])   . Thyroid disease     Past Surgical History:  Procedure Laterality Date  . ABDOMINAL HYSTERECTOMY    . BREAST LUMPECTOMY WITH  RADIOACTIVE SEED AND SENTINEL LYMPH NODE BIOPSY Left 06/06/2016   Procedure: LEFT BREAST LUMPECTOMY WITH RADIOACTIVE SEED AND LEFT AXILLARY SENTINEL LYMPH NODE BIOPSY;  Surgeon: Alphonsa Overall, MD;  Location: [facility];  Service: General;  Laterality: Left;  . BREAST SURGERY     left  . EP IMPLANTABLE DEVICE N/A 05/15/2016   Procedure: PPM Generator Changeout;  Surgeon: Evans Lance, MD;  Location: [facility];  Service: Cardiovascular;  Laterality: N/A;  . HERNIA REPAIR    . LEFT HEART CATH AND CORONARY ANGIOGRAPHY N/A 10/09/2017   Procedure: LEFT HEART CATH AND CORONARY ANGIOGRAPHY;  Surgeon: Belva Crome, MD;  Location: [facility];  Service: Cardiovascular;  Laterality: N/A;  . PACEMAKER INSERTION      Current Medications: Current Meds  Medication Sig  . albuterol (PROVENTIL HFA;VENTOLIN HFA) 108 (90 BASE) MCG/ACT inhaler Inhale 2 puffs into the lungs every 6 (six) hours as needed for wheezing or shortness of breath.   Marland Kitchen albuterol (PROVENTIL) (2.5 MG/3ML) 0.083% nebulizer solution Take 2.5 mg by nebulization every 6 (six) hours as needed for wheezing or shortness of breath.  . levothyroxine (SYNTHROID) 88 MCG tablet Take 88 mcg by mouth daily.  . mometasone (NASONEX) 50 MCG/ACT nasal spray Place 2 sprays into the nose daily.  . Omega-3 Fatty Acids (FISH OIL) 1000 MG CAPS Take 1,000 mg by mouth daily.  Marland Kitchen omeprazole (PRILOSEC) 20 MG capsule Take 20 mg by mouth daily.  Marland Kitchen  ondansetron (ZOFRAN) 4 MG tablet Take 1 tablet (4 mg total) by mouth every 8 (eight) hours as needed for nausea or vomiting.  . tamoxifen (NOLVADEX) 20 MG tablet Take 1 tablet (20 mg total) by mouth daily.  Marland Kitchen triamterene-hydrochlorothiazide (MAXZIDE) 75-50 MG tablet Take 1 tablet by mouth every evening.   . [DISCONTINUED] levothyroxine (SYNTHROID, LEVOTHROID) 100 MCG tablet Take 100 mcg by mouth daily before breakfast.     Allergies:   Bee venom, Morphine and related, Nsaids, Wasp venom, Penicillins, and Sulfa  antibiotics   Social History   Socioeconomic History  . Marital status: Married    Spouse name: Not on file  . Number of children: 2  . Years of education: Not on file  . Highest education level: Not on file  Occupational History  . Occupation: Electrical engineer  Tobacco Use  . Smoking status: Never Smoker  . Smokeless tobacco: Never Used  Vaping Use  . Vaping Use: Never used  Substance and Sexual Activity  . Alcohol use: No  . Drug use: No  . Sexual activity: Never    Birth control/protection: Abstinence, None  Other Topics Concern  . Not on file  Social History Narrative  . Not on file   Social Determinants of Health   Financial Resource Strain:   . Difficulty of Paying Living Expenses:   Food Insecurity:   . Worried About Charity fundraiser in the Last Year:   . Arboriculturist in the Last Year:   Transportation Needs:   . Film/video editor (Medical):   Marland Kitchen Lack of Transportation (Non-Medical):   Physical Activity:   . Days of Exercise per Week:   . Minutes of Exercise per Session:   Stress:   . Feeling of Stress :   Social Connections:   . Frequency of Communication with Friends and Family:   . Frequency of Social Gatherings with Friends and Family:   . Attends Religious Services:   . Active Member of Clubs or Organizations:   . Attends Archivist Meetings:   Marland Kitchen Marital Status:      Family History: The patient's family history includes Bone cancer in her father; Bradycardia in her cousin; Breast cancer (age of onset: 82) in her cousin and cousin; Breast cancer (age of onset: 53) in her sister; Colon cancer (age of onset: 88) in her cousin; Congestive Heart Failure in her mother; Heart attack in her maternal grandmother and paternal grandmother; Hypertension in her father; Melanoma in her daughter; Pancreatic cancer (age of onset: 73) in her paternal aunt; Rectal cancer (age of onset: 82) in her paternal aunt; Stroke in her paternal grandfather.  ROS:    Please see the history of present illness.    She has family history of vascular disease affecting both her mother and father.  Her brother recently had 4 stents.  All other systems reviewed and are negative.  EKGs/Labs/Other Studies Reviewed:    The following studies were reviewed today: COR ANGIOGRAPHY 2019: Conclusion   30 to 40% ostial left main.  There is ostial calcification noted as well.  LAD wraps around the left ventricular apex.  It contains luminal irregularities from the mid to distal vessel obstructing up to 30%.  No high-grade focal stenosis is noted.  Circumflex is dominant.  The PDA contains diffuse 60 to 70% proximal narrowing.  Irregularities are noted in the mid vessel.  Nondominant right coronary without obstructive disease.  Normal left ventricular systolic function with EF  55 to 60%.  Elevated LVEDP of 21 millimeters mercury.  Findings are compatible with chronic diastolic heart failure.  RECOMMENDATIONS:   Aggressive risk factor modification including lipid-lowering LDL less than 70.  High blood pressure control 130/80 mmHg or less.  Adjust medications to lower LVEDP.  May need to add low-dose diuretic therapy   EKG:  EKG atrial pacing with left bundle branch block configuration.  QRS duration 144 ms.  When compared to the 2019 tracing, the axis is more leftward.  Recent Labs: 01/06/2019: ALT 15 12/04/2019: BUN 16; Creatinine, Ser 0.73; Hemoglobin 13.2; Platelets 284; Potassium 3.5; Sodium 137  Recent Lipid Panel No results found for: CHOL, TRIG, HDL, CHOLHDL, VLDL, LDLCALC, LDLDIRECT  Physical Exam:    VS:  BP 138/72   Pulse 65   Ht 5' 2.5" (1.588 m)   Wt 148 lb (67.1 kg)   SpO2 96%   BMI 26.64 kg/m     Wt Readings from Last 3 Encounters:  12/20/19 148 lb (67.1 kg)  12/04/19 150 lb (68 kg)  01/06/19 159 lb 4.8 oz (72.3 kg)     GEN: Younger than stated age in appearance. No acute distress HEENT: Normal NECK: No JVD.  No bruit  heard. LYMPHATICS: No lymphadenopathy CARDIAC:  RRR without murmur, gallop, or edema. VASCULAR:  Normal Pulses. No bruits. RESPIRATORY:  Clear to auscultation without rales, wheezing or rhonchi  ABDOMEN: Soft, non-tender, non-distended, No pulsatile mass, MUSCULOSKELETAL: No deformity  SKIN: Warm and dry NEUROLOGIC:  Alert and oriented x 3 PSYCHIATRIC:  Normal affect   ASSESSMENT:    1. Coronary artery disease involving native coronary artery of native heart without angina pectoris   2. Aortic atherosclerosis (H[facility])   3. Atherosclerosis of both carotid arteries   4. SSS (sick sinus syndrome) ([place])   5. Chronic diastolic heart failure ([place])   6. Hyperlipidemia, unspecified hyperlipidemia type   7. Left bundle branch block   8. Educated about COVID-19 virus infection    PLAN:    In order of problems listed above:  1. For all vascular territories, primary/secondary prevention discussed in great detail as noted below.  Needs to increase physical activity.  Needs to tighten LDL to levels less than 70.  Already on max dose Lipitor.  Consider add-on therapy in the form of ezetimibe but will wait until upcoming labs are done by Dr. Rex Kras.  I have recommended starting coated aspirin 81 mg Monday, Wednesday, and Friday. 2. Discussed the overlap preventive management of aortic atherosclerosis which is similar to that for CAD. 3. Overlap secondary prevention is similar to that for CAD. 4. Pacemaker placement for sick sinus syndrome.  Patient is asymptomatic. 5. No evidence of volume overload. 6. Last lipid revealed an LDL of 81.  She has upcoming blood work.  We will need to be more aggressive given the diffuse nature of atherosclerosis.  Target should be 55 or less. 7. Persistent and unchanged compared to 2019. 8. Vaccination has occurred.  Practicing social distancing.   Overall education and awareness concerning primary/secondary risk prevention was discussed in detail: LDL less than 70,  hemoglobin A1c less than 7, blood pressure target less than 130/80 mmHg, >150 minutes of moderate aerobic activity per week, avoidance of smoking, weight control (via diet and exercise), and continued surveillance/management of/for obstructive sleep apnea.    Medication Adjustments/Labs and Tests Ordered: Current medicines are reviewed at length with the patient today.  Concerns regarding medicines are outlined above.  Orders Placed This Encounter  Procedures  . EKG 12-Lead   Meds ordered this encounter  Medications  . aspirin EC 81 MG tablet    Sig: Take 1 tablet (81 mg total) by mouth every Monday, Wednesday, and Friday. Swallow whole.    Dispense:  45 tablet    Refill:  3    Patient Instructions  Medication Instructions:  1) START Aspirin 81mg  once daily on Monday, Wednesday and Friday.  *If you need a refill on your cardiac medications before your next appointment, please call your pharmacy*   Lab Work: None If you have labs (blood work) drawn today and your tests are completely normal, you will receive your results only by: Marland Kitchen MyChart Message (if you have MyChart) OR . A paper copy in the mail If you have any lab test that is abnormal or we need to change your treatment, we will call you to review the results.   Testing/Procedures: None   Follow-Up: At [facility], you and your health needs are our priority.  As part of our continuing mission to provide you with exceptional heart care, we have created designated Provider Care Teams.  These Care Teams include your primary Cardiologist (physician) and Advanced Practice Providers (APPs -  Physician Assistants and Nurse Practitioners) who all work together to provide you with the care you need, when you need it.  We recommend signing up for the patient portal called "MyChart".  Sign up information is provided on this After Visit Summary.  MyChart is used to connect with patients for Virtual Visits (Telemedicine).  Patients  are able to view lab/test results, encounter notes, upcoming appointments, etc.  Non-urgent messages can be sent to your provider as well.   To learn more about what you can do with MyChart, go to [email or web address].    Your next appointment:   12 month(s)  The format for your next appointment:   In Person  Provider:   You may see [name], MD or one of the following Advanced Practice Providers on your designated Care Team:    Truitt Merle, NP  Cecilie Kicks, NP  Kathyrn Drown, NP    Other Instructions  Your physician discussed the importance of regular exercise and recommended that you start or continue a regular exercise program for good health.  Your provider recommends that you maintain 150 minutes per week of moderate aerobic activity.  Your goal LDL (bad) cholesterol is 70 or less. When you have this checked at Dr. Eddie Dibbles office, please let us know if it is higher than that.       Signed, [name], MD  12/20/2019 6:12 PM     [facility]

## 2019-12-21 ENCOUNTER — Telehealth: Payer: Self-pay | Admitting: Interventional Cardiology

## 2019-12-21 DIAGNOSIS — I2510 Atherosclerotic heart disease of native coronary artery without angina pectoris: Secondary | ICD-10-CM

## 2019-12-21 DIAGNOSIS — E785 Hyperlipidemia, unspecified: Secondary | ICD-10-CM

## 2019-12-21 NOTE — Telephone Encounter (Signed)
Patient is requesting orders for lab work. Patient states she would also like to discuss results from EKG completed during appointment on 12/20/19. Please call.

## 2019-12-21 NOTE — Telephone Encounter (Signed)
Spoke with pt and reviewed EKG from yesterday.  Pt mentioned that she thought she had a physical scheduled in a couple of weeks but it is not until late October.  She said Dr. Tamala Julian mentioned staying on top of her cholesterol so she wanted to go ahead and get labs with our office.  Advised I will send to Dr. Tamala Julian to see what all labs he wants and then I will be in contact to get her scheduled.  Pt appreciative for call.

## 2019-12-22 ENCOUNTER — Encounter: Payer: Self-pay | Admitting: *Deleted

## 2019-12-22 NOTE — Telephone Encounter (Signed)
Comprehensive metabolic panel and lipid panel fasting.

## 2019-12-22 NOTE — Telephone Encounter (Signed)
Pt called back and scheduled labs for tomorrow. 

## 2019-12-22 NOTE — Telephone Encounter (Signed)
Attempted to contact pt to schedule labs.  Phone rang several times with no answer and no VM.  Pt needs to be scheduled for fasting labs at her earliest convenience.  Lab orders for BMET, Liver and Lipid are in.

## 2019-12-23 ENCOUNTER — Other Ambulatory Visit: Payer: Medicare HMO | Admitting: *Deleted

## 2019-12-23 ENCOUNTER — Other Ambulatory Visit: Payer: Self-pay

## 2019-12-23 DIAGNOSIS — I2510 Atherosclerotic heart disease of native coronary artery without angina pectoris: Secondary | ICD-10-CM | POA: Diagnosis not present

## 2019-12-23 DIAGNOSIS — E785 Hyperlipidemia, unspecified: Secondary | ICD-10-CM | POA: Diagnosis not present

## 2019-12-23 LAB — BASIC METABOLIC PANEL
BUN/Creatinine Ratio: 20 (ref 12–28)
BUN: 16 mg/dL (ref 8–27)
CO2: 25 mmol/L (ref 20–29)
Calcium: 9.6 mg/dL (ref 8.7–10.3)
Chloride: 102 mmol/L (ref 96–106)
Creatinine, Ser: 0.81 mg/dL (ref 0.57–1.00)
GFR calc Af Amer: 82 mL/min/{1.73_m2} (ref >59)
GFR calc non Af Amer: 71 mL/min/{1.73_m2} (ref >59)
Glucose: 94 mg/dL (ref 65–99)
Potassium: 4.4 mmol/L (ref 3.5–5.2)
Sodium: 140 mmol/L (ref 134–144)

## 2019-12-23 LAB — HEPATIC FUNCTION PANEL
ALT: 19 IU/L (ref 0–32)
AST: 24 IU/L (ref 0–40)
Albumin: 4.3 g/dL (ref 3.7–4.7)
Alkaline Phosphatase: 80 IU/L (ref 48–121)
Bilirubin Total: 0.8 mg/dL (ref 0.0–1.2)
Bilirubin, Direct: 0.2 mg/dL (ref 0.00–0.40)
Total Protein: 6.6 g/dL (ref 6.0–8.5)

## 2019-12-23 LAB — LIPID PANEL
Chol/HDL Ratio: 4 ratio (ref 0.0–4.4)
Cholesterol, Total: 182 mg/dL (ref 100–199)
HDL: 45 mg/dL (ref >39)
LDL Chol Calc (NIH): 106 mg/dL — ABNORMAL HIGH (ref 0–99)
Triglycerides: 175 mg/dL — ABNORMAL HIGH (ref 0–149)
VLDL Cholesterol Cal: 31 mg/dL (ref 5–40)

## 2019-12-26 ENCOUNTER — Telehealth: Payer: Self-pay | Admitting: Interventional Cardiology

## 2019-12-26 DIAGNOSIS — I2510 Atherosclerotic heart disease of native coronary artery without angina pectoris: Secondary | ICD-10-CM

## 2019-12-26 DIAGNOSIS — E785 Hyperlipidemia, unspecified: Secondary | ICD-10-CM

## 2019-12-26 MED ORDER — EZETIMIBE 10 MG PO TABS
10.0000 mg | ORAL_TABLET | Freq: Every day | ORAL | 3 refills | Status: DC
Start: 2019-12-26 — End: 2020-11-19

## 2019-12-26 NOTE — Telephone Encounter (Signed)
Patient states she is calling to inquire about whether or not she will need to take additional medication due to results from lab work completed on 12/23/19. Please call

## 2019-12-26 NOTE — Telephone Encounter (Signed)
Alexis Crome, MD  Loren Racer, RN Let the patient know to add Ezetimibe 10 mg daily. Repeat Fasting Lipid in 2 months. If LDL still > 70, will need to consider PCSK-9    Spoke with pt and reviewed results and recommendations per Dr. Tamala Julian.  Pt will come for labs on 9/28.  Pt verbalized understanding and was in agreement with this plan.

## 2020-01-02 ENCOUNTER — Ambulatory Visit (INDEPENDENT_AMBULATORY_CARE_PROVIDER_SITE_OTHER): Payer: Medicare HMO | Admitting: *Deleted

## 2020-01-02 DIAGNOSIS — I495 Sick sinus syndrome: Secondary | ICD-10-CM | POA: Diagnosis not present

## 2020-01-03 LAB — CUP PACEART REMOTE DEVICE CHECK
Battery Impedance: 231 Ohm
Battery Remaining Longevity: 99 mo
Battery Voltage: 2.78 V
Brady Statistic AP VP Percent: 1 %
Brady Statistic AP VS Percent: 77 %
Brady Statistic AS VP Percent: 1 %
Brady Statistic AS VS Percent: 21 %
Date Time Interrogation Session: 20210803161404
Implantable Lead Implant Date: 19970926
Implantable Lead Implant Date: 19970926
Implantable Lead Location: 753859
Implantable Lead Location: 753860
Implantable Lead Model: 4024
Implantable Lead Model: 4524
Implantable Pulse Generator Implant Date: 20171214
Lead Channel Impedance Value: 318 Ohm
Lead Channel Impedance Value: 986 Ohm
Lead Channel Pacing Threshold Amplitude: 0.875 V
Lead Channel Pacing Threshold Amplitude: 1.5 V
Lead Channel Pacing Threshold Pulse Width: 0.4 ms
Lead Channel Pacing Threshold Pulse Width: 0.4 ms
Lead Channel Setting Pacing Amplitude: 2.5 V
Lead Channel Setting Pacing Amplitude: 2.5 V
Lead Channel Setting Pacing Pulse Width: 0.4 ms
Lead Channel Setting Sensing Sensitivity: 4 mV

## 2020-01-04 DIAGNOSIS — E039 Hypothyroidism, unspecified: Secondary | ICD-10-CM | POA: Diagnosis not present

## 2020-01-04 DIAGNOSIS — I5032 Chronic diastolic (congestive) heart failure: Secondary | ICD-10-CM | POA: Diagnosis not present

## 2020-01-04 DIAGNOSIS — M8580 Other specified disorders of bone density and structure, unspecified site: Secondary | ICD-10-CM | POA: Diagnosis not present

## 2020-01-04 DIAGNOSIS — E782 Mixed hyperlipidemia: Secondary | ICD-10-CM | POA: Diagnosis not present

## 2020-01-04 DIAGNOSIS — I2510 Atherosclerotic heart disease of native coronary artery without angina pectoris: Secondary | ICD-10-CM | POA: Diagnosis not present

## 2020-01-04 DIAGNOSIS — Z853 Personal history of malignant neoplasm of breast: Secondary | ICD-10-CM | POA: Diagnosis not present

## 2020-01-04 DIAGNOSIS — C50919 Malignant neoplasm of unspecified site of unspecified female breast: Secondary | ICD-10-CM | POA: Diagnosis not present

## 2020-01-04 DIAGNOSIS — J4520 Mild intermittent asthma, uncomplicated: Secondary | ICD-10-CM | POA: Diagnosis not present

## 2020-01-04 DIAGNOSIS — I10 Essential (primary) hypertension: Secondary | ICD-10-CM | POA: Diagnosis not present

## 2020-01-05 NOTE — Progress Notes (Signed)
Remote pacemaker transmission.   

## 2020-01-10 NOTE — Progress Notes (Signed)
[place]  [phone] [phone]     ID: [name] DOB: June 05, 1945  MR#: [id number]  WHQ#:[id number]  Patient Care Team: [name], MD as PCP - General (Family Medicine) Belva Crome, MD as PCP - Cardiology (Cardiology) Pinchus Weckwerth, Virgie Dad, MD as Consulting Physician (Oncology) Gery Pray, MD as Consulting Physician (Radiation Oncology) Alphonsa Overall, MD as Consulting Physician (General Surgery) Teena Irani, MD (Inactive) as Consulting Physician (Gastroenterology) Delice Bison Charlestine Massed, NP as Nurse Practitioner (Hematology and Oncology) Deboraha Sprang, MD as Consulting Physician (Cardiology) OTHER MD:   CHIEF COMPLAINT: HER-2 positive breast cancer  CURRENT TREATMENT: Tamoxifen   INTERVAL HISTORY: [name] returns today for follow-up of her estrogen receptor positive breast cancer.   She continues on tamoxifen with good tolerance. She still has hot flashes as the main side effect.  They do not tend to wake her up at night.  Since her last visit, she underwent bilateral diagnostic mammography with tomography at [facility] on 04/14/2019 showing: breast density category A; no evidence of malignancy in either breast.   She also underwent bone density screening the same day showing a T-score of -1.3, which is considered osteopenic.  She presented to the ED on 12/04/2019 with head and neck pain. She underwent head CT at that time showing: no acute intracranial abnormality.  Neck CT also performed at that time showed: no soft tissue swelling or evidence of an acute inflammatory process; cervical spondylosis, multilevel bony neural foraminal narrowing; atherosclerotic plaque.  She underwent carotid ultrasound on 12/13/2019 showing: color duplex indicated moderate heterogeneous plaque with no hemodynamically significant stenosis.   REVIEW OF SYSTEMS: [name] received [employer] vaccine.  Her husband received the Moderna vaccine.  They  tolerated it well and of course they are also keeping the regular precautions.  Her headache has resolved and she is being treated now with aspirin and Zetia for the carotid calcifications.  She is also exercising more, walking her dog 3 times a day, and trying to get into other activities.  Aside from these issues a detailed review of systems today was stable    BREAST CANCER HISTORY: From the original intake note:   [name] had bilateral screening mammography at [facility] 03/28/2016. This found the breast density to be category A. In the left breast at the 12:00 position there was a new oval lesion. There were no other findings of concern. On 04/04/2016 the patient underwent left ultrasonography this showed a possible hypoechoic lesion in the upper outer quadrant of the left breast, measuring approximately 0.5 cm. The left axilla was mammographically benign.  Biopsy of the left breast area in question 04/09/2016 showed ([id number]) invasive ductal carcinoma, grade 1 or 2, estrogen and progesterone receptor negative, with an MIB-1 of 10%, but HER-2 amplified, the signals ratio being 7.36 and the number per cell 9.20.  Her subsequent history is as detailed below.   PAST MEDICAL HISTORY: Past Medical History:  Diagnosis Date  . Allergic rhinitis   . Arthritis   . Family history of breast cancer   . Family history of colon cancer   . Family history of pancreatic cancer   . GERD (gastroesophageal reflux disease)   . GI bleed   . History of basal cell cancer   . History of radiation therapy 08/05/2016 - 09/12/2016   Left Breast 50.4 Gy 28 fractions  . Hyperlipidemia   . Hypertension   . Hypothyroidism   . Lumbar disc disease   . Malignant neoplasm  of upper-outer quadrant of left female breast ([place]) 04/11/2016  . Pacemaker   . Peptic ulcer disease   . SSS (sick sinus syndrome) ([place])   . Thyroid disease     PAST SURGICAL HISTORY: Past Surgical History:  Procedure Laterality Date  .  ABDOMINAL HYSTERECTOMY    . BREAST LUMPECTOMY WITH RADIOACTIVE SEED AND SENTINEL LYMPH NODE BIOPSY Left 06/06/2016   Procedure: LEFT BREAST LUMPECTOMY WITH RADIOACTIVE SEED AND LEFT AXILLARY SENTINEL LYMPH NODE BIOPSY;  Surgeon: Alphonsa Overall, MD;  Location: [place];  Service: General;  Laterality: Left;  . BREAST SURGERY     left  . EP IMPLANTABLE DEVICE N/A 05/15/2016   Procedure: PPM Generator Changeout;  Surgeon: Evans Lance, MD;  Location: [place] [facility];  Service: Cardiovascular;  Laterality: N/A;  . HERNIA REPAIR    . LEFT HEART CATH AND CORONARY ANGIOGRAPHY N/A 10/09/2017   Procedure: LEFT HEART CATH AND CORONARY ANGIOGRAPHY;  Surgeon: Belva Crome, MD;  Location: [place] [facility];  Service: Cardiovascular;  Laterality: N/A;  . PACEMAKER INSERTION      FAMILY HISTORY Family History  Problem Relation Age of Onset  . Congestive Heart Failure Mother   . Hypertension Father   . Bone cancer Father   . Breast cancer Sister 48  . Bradycardia Cousin   . Breast cancer Cousin 59  . Breast cancer Cousin 88  . Colon cancer Cousin 33  . Pancreatic cancer Paternal Aunt 76  . Rectal cancer Paternal Aunt 21  . Heart attack Maternal Grandmother   . Heart attack Paternal Grandmother   . Stroke Paternal Grandfather   . Melanoma Daughter        dx in her 14s  The patient's father died at the age of 71 with myeloma. The patient's mother died at the age of 49 from heart disease the patient has a sister diagnosed with breast cancer at age 2, a paternal cousin diagnosed with breast cancer at age 49, and a maternal cousin diagnosed with breast cancer. There is also a history of pancreatic, rectal, and: colon cancers all on the father's side of the family   GYNECOLOGIC HISTORY:  No LMP recorded. Patient has had a hysterectomy. Menarche age 74, first live birth age 81, the patient is [place] P2. She stopped having periods in 1981. She did not take hormone replacement. She used oral contraceptives  remotely with no complications   SOCIAL HISTORY: (updated 01/2019) She has worked as a Pharmacist, hospital substituted, in Scientist, research (medical), and more recently as a Engineer, building services. Her husband [name]") [place] has been under the care of hospice and is stable. The patient's daughter [name] lives in [place] where she works as a Pharmacist, hospital. Her daughter [name] lives in [place]. She has 13 grandchildren and 4 great-grandchildren. 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     ADVANCED DIRECTIVES: The patient's daughter, [name], is her healthcare power of attorney. She can be reached at [phone]   HEALTH MAINTENANCE: Social History   Tobacco Use  . Smoking status: Never Smoker  . Smokeless tobacco: Never Used  Vaping Use  . Vaping Use: Never used  Substance Use Topics  . Alcohol use: No  . Drug use: No     Colonoscopy: 2007/ [facility]  PAP:  Bone density: 03/28/2016 at [place], T score of -1.3   Allergies  Allergen Reactions  . Bee Venom Shortness Of Breath and Itching  . Morphine And Related Shortness Of Breath and Itching  .  Nsaids Other (See Comments)    Upper GI bleed  . Wasp Venom Shortness Of Breath and Itching  . Penicillins Itching and Other (See Comments)    Has patient had a PCN reaction causing immediate rash, facial/tongue/throat swelling, SOB or lightheadedness with hypotension:No Has patient had a PCN reaction causing severe rash involving mucus membranes or skin necrosis:No Has patient had a PCN reaction that required hospitalization:No Has patient had a PCN reaction occurring within the last 10 years:No If all of the above answers are "NO", then may proceed with Cephalosporin use.   . Sulfa Antibiotics Itching    Current Outpatient Medications  Medication Sig Dispense Refill  . albuterol (PROVENTIL HFA;VENTOLIN HFA) 108 (90 BASE) MCG/ACT inhaler Inhale 2 puffs into the lungs every 6 (six) hours as needed for wheezing or shortness of breath.     Marland Kitchen albuterol  (PROVENTIL) (2.5 MG/3ML) 0.083% nebulizer solution Take 2.5 mg by nebulization every 6 (six) hours as needed for wheezing or shortness of breath.    Marland Kitchen aspirin EC 81 MG tablet Take 1 tablet (81 mg total) by mouth every Monday, Wednesday, and Friday. Swallow whole. 45 tablet 3  . atorvastatin (LIPITOR) 80 MG tablet Take 1 tablet (80 mg total) by mouth daily. 90 tablet 3  . ezetimibe (ZETIA) 10 MG tablet Take 1 tablet (10 mg total) by mouth daily. 90 tablet 3  . levothyroxine (SYNTHROID) 88 MCG tablet Take 88 mcg by mouth daily.    . mometasone (NASONEX) 50 MCG/ACT nasal spray Place 2 sprays into the nose daily.    . Omega-3 Fatty Acids (FISH OIL) 1000 MG CAPS Take 1,000 mg by mouth daily.    Marland Kitchen omeprazole (PRILOSEC) 20 MG capsule Take 20 mg by mouth daily.    . ondansetron (ZOFRAN) 4 MG tablet Take 1 tablet (4 mg total) by mouth every 8 (eight) hours as needed for nausea or vomiting. 15 tablet 0  . tamoxifen (NOLVADEX) 20 MG tablet Take 1 tablet (20 mg total) by mouth daily. 90 tablet 3  . triamterene-hydrochlorothiazide (MAXZIDE) 75-50 MG tablet Take 1 tablet by mouth every evening.      No current facility-administered medications for this visit.    OBJECTIVE:  white woman in no acute distress  Vitals:   01/11/20 0925  BP: 138/67  Pulse: 80  Resp: 18  Temp: (!) 97.2 F (36.2 C)  SpO2: 100%     Body mass index is 28.74 kg/m.    ECOG FS:0 - Asymptomatic  Sclerae unicteric, EOMs intact Wearing a mask No cervical or supraclavicular adenopathy Lungs no rales or rhonchi Heart regular rate and rhythm Abd soft, nontender, positive bowel sounds MSK no focal spinal tenderness, no upper extremity lymphedema Neuro: nonfocal, well oriented, appropriate affect Breasts: The right breast is benign.  The left breast has undergone lumpectomy and radiation.  The cosmetic result is good.  There is no evidence of local recurrence.  Both axillae are benign.  LAB RESULTS:  CMP     Component Value  Date/Time   NA 140 12/23/2019 0000   NA 142 04/16/2016 0822   K 4.4 12/23/2019 0000   K 3.8 04/16/2016 0822   CL 102 12/23/2019 0000   CO2 25 12/23/2019 0000   CO2 23 04/16/2016 0822   GLUCOSE 94 12/23/2019 0000   GLUCOSE 98 12/04/2019 2024   GLUCOSE 102 04/16/2016 0822   BUN 16 12/23/2019 0000   BUN 25.3 04/16/2016 0822   CREATININE 0.81 12/23/2019 0000   CREATININE  0.97 (H) 05/01/2016 1627   CREATININE 0.8 04/16/2016 0822   CALCIUM 9.6 12/23/2019 0000   CALCIUM 9.7 04/16/2016 0822   PROT 6.6 12/23/2019 0000   PROT 6.9 04/16/2016 0822   ALBUMIN 4.3 12/23/2019 0000   ALBUMIN 3.8 04/16/2016 0822   AST 24 12/23/2019 0000   AST 20 04/16/2016 0822   ALT 19 12/23/2019 0000   ALT 18 04/16/2016 0822   ALKPHOS 80 12/23/2019 0000   ALKPHOS 68 04/16/2016 0822   BILITOT 0.8 12/23/2019 0000   BILITOT 0.60 04/16/2016 0822   GFRNONAA 71 12/23/2019 0000   GFRAA 82 12/23/2019 0000    INo results found for: SPEP, UPEP  Lab Results  Component Value Date   WBC 5.6 01/11/2020   NEUTROABS 3.7 01/11/2020   HGB 13.6 01/11/2020   HCT 39.2 01/11/2020   MCV 86.2 01/11/2020   PLT 279 01/11/2020      Chemistry      Component Value Date/Time   NA 140 12/23/2019 0000   NA 142 04/16/2016 0822   K 4.4 12/23/2019 0000   K 3.8 04/16/2016 0822   CL 102 12/23/2019 0000   CO2 25 12/23/2019 0000   CO2 23 04/16/2016 0822   BUN 16 12/23/2019 0000   BUN 25.3 04/16/2016 0822   CREATININE 0.81 12/23/2019 0000   CREATININE 0.97 (H) 05/01/2016 1627   CREATININE 0.8 04/16/2016 0822      Component Value Date/Time   CALCIUM 9.6 12/23/2019 0000   CALCIUM 9.7 04/16/2016 0822   ALKPHOS 80 12/23/2019 0000   ALKPHOS 68 04/16/2016 0822   AST 24 12/23/2019 0000   AST 20 04/16/2016 0822   ALT 19 12/23/2019 0000   ALT 18 04/16/2016 0822   BILITOT 0.8 12/23/2019 0000   BILITOT 0.60 04/16/2016 0822       No results found for: LABCA2  No components found for: LABCA125  No results for input(s):  INR in the last 168 hours.  Urinalysis No results found for: COLORURINE, APPEARANCEUR, LABSPEC, PHURINE, GLUCOSEU, HGBUR, BILIRUBINUR, KETONESUR, PROTEINUR, UROBILINOGEN, NITRITE, LEUKOCYTESUR   STUDIES: US Carotid Bilateral  Result Date: 12/14/2019 CLINICAL DATA:  74 year old female with atherosclerotic risk factors and carotid plaque EXAM: BILATERAL CAROTID DUPLEX ULTRASOUND TECHNIQUE: Pearline Cables scale imaging, color Doppler and duplex ultrasound were performed of bilateral carotid and vertebral arteries in the neck. COMPARISON:  None. FINDINGS: Criteria: Quantification of carotid stenosis is based on velocity parameters that correlate the residual internal carotid diameter with NASCET-based stenosis levels, using the diameter of the distal internal carotid lumen as the denominator for stenosis measurement. The following velocity measurements were obtained: RIGHT ICA:  Systolic 401 cm/sec, Diastolic 25 cm/sec CCA:  027 cm/sec SYSTOLIC ICA/CCA RATIO:  0.6 ECA:  121 cm/sec LEFT ICA:  Systolic 253 cm/sec, Diastolic 28 cm/sec CCA:  664 cm/sec SYSTOLIC ICA/CCA RATIO:  1.2 ECA:  122 cm/sec Right Brachial SBP: 145 Left Brachial SBP: Not acquired RIGHT CAROTID ARTERY: No significant calcified disease of the right common carotid artery. Intermediate waveform maintained. Heterogeneous plaque without significant calcifications at the right carotid bifurcation. Low resistance waveform of the right ICA. Significant tortuosity. RIGHT VERTEBRAL ARTERY: Antegrade flow with low resistance waveform. LEFT CAROTID ARTERY: No significant calcified disease of the left common carotid artery. Intermediate waveform maintained. Heterogeneous plaque at the left carotid bifurcation without significant calcifications. Low resistance waveform of the left ICA. Significant tortuosity LEFT VERTEBRAL ARTERY:  Antegrade flow with low resistance waveform. IMPRESSION: Color duplex indicates moderate heterogeneous plaque with no hemodynamically  significant stenosis by duplex criteria in the extracranial cerebrovascular circulation. Signed, Dulcy Fanny. Dellia Nims, RPVI [facility] Radiology Electronically Signed   By: Corrie Mckusick D.O.   On: 12/14/2019 10:04   CUP PACEART REMOTE DEVICE CHECK  Result Date: 01/03/2020 Scheduled remote reviewed. Normal device function.  AMS burden <0.1%, all < 52mn. 3 VHR episodes, all EGM's show atrially driven. Next remote 91 days- JBox, RN/CVRS   ELIGIBLE FOR AVAILABLE RESEARCH PROTOCOL: no  ASSESSMENT: 74y.o. [place] woman status post left breast upper outer quadrant biopsy 04/09/2016 for a clinical T1a N0, stage IA invasive ductal carcinoma, grade 1 or 2, estrogen and progesterone receptor negative, with an MIB-1 of 10%, but HER-2 amplified  (1) left lumpectomy and sentinel lymph node samplng 06/06/2016 showed a pT1a pN0, stage IA  Invasive ductal carcinoma , grade 2 , with negatve margins  (2) No consideration of anti-HER-2 immunotherapy/chemotherapy given final tumor size  (3) adjuvant radiation 08/05/16-09/12/16 Site/dose:   Left breast/ 50.4 Gy in 28 fractions  (a) the patient's pacemaker has been moved to the contralateral side to facilitate treatment   (4)  Genetics testing through the Comprehensive Cancer Panel offered by [facility]  found no deleterious mutations in APC, ATM, AXIN2, BARD1, BMPR1A, BRCA1, BRCA2, BRIP1, CDH1, CDK4, CDKN2A, CHEK2, EPCAM, FANCC, MLH1, MSH2, MSH6, MUTYH, NBN, PALB2, PMS2, POLD1, POLE, PTEN, RAD51C, RAD51D, SCG5/GREM1, SMAD4, STK11, TP53, VHL, and XRCC2.     (5) started tamoxifen 12/13/2016 for prophylaxis  (a) bone density 08/07/2003 showed a T score of -1.0.  [facility] T score of -1.3.   PLAN: SLauranneis now 3-1/2 years out from definitive surgery for her breast cancer with no evidence of disease recurrence.  This is very favorable.  She is tolerating tamoxifen generally well  except for problems with hot flashes.  I offered her venlafaxine but she thinks she only has a little longer to go (a year and a half) and she can "tough it out".  [facility].  I am going to see her in December 2022 and that will be her graduation visit  She knows to call for any other issue that may develop before then  Total encounter time 25 minutes.*  Glanda Spanbauer, GVirgie Dad MD  01/11/20 9:38 AM Medical Oncology and H[facility]  [id number]. [phone]   Fax. [phone]  I, KWilburn Mylar am acting as scribe for Dr. GVirgie Dad Delmi Fulfer.  I, GLurline DelMD, have reviewed the above documentation for accuracy and completeness, and I agree with the above.   *Total Encounter Time as defined by the Centers for Medicare and Medicaid Services includes, in addition to the face-to-face time of a patient visit (documented in the note above) non-face-to-face time: obtaining and reviewing outside history, ordering and reviewing medications, tests or procedures, care coordination (communications with other health care professionals or caregivers) and documentation in the medical record.

## 2020-01-11 ENCOUNTER — Other Ambulatory Visit: Payer: Self-pay

## 2020-01-11 ENCOUNTER — Inpatient Hospital Stay: Payer: Medicare HMO | Attending: Oncology

## 2020-01-11 ENCOUNTER — Inpatient Hospital Stay (HOSPITAL_BASED_OUTPATIENT_CLINIC_OR_DEPARTMENT_OTHER): Payer: Medicare HMO | Admitting: Oncology

## 2020-01-11 VITALS — BP 138/67 | HR 80 | Temp 97.2°F | Resp 18 | Ht 62.5 in | Wt 159.7 lb

## 2020-01-11 DIAGNOSIS — Z886 Allergy status to analgesic agent status: Secondary | ICD-10-CM | POA: Insufficient documentation

## 2020-01-11 DIAGNOSIS — E785 Hyperlipidemia, unspecified: Secondary | ICD-10-CM | POA: Diagnosis not present

## 2020-01-11 DIAGNOSIS — Z808 Family history of malignant neoplasm of other organs or systems: Secondary | ICD-10-CM | POA: Insufficient documentation

## 2020-01-11 DIAGNOSIS — Z170 Estrogen receptor positive status [ER+]: Secondary | ICD-10-CM | POA: Diagnosis not present

## 2020-01-11 DIAGNOSIS — Z885 Allergy status to narcotic agent status: Secondary | ICD-10-CM | POA: Insufficient documentation

## 2020-01-11 DIAGNOSIS — Z79899 Other long term (current) drug therapy: Secondary | ICD-10-CM | POA: Diagnosis not present

## 2020-01-11 DIAGNOSIS — C50412 Malignant neoplasm of upper-outer quadrant of left female breast: Secondary | ICD-10-CM

## 2020-01-11 DIAGNOSIS — Z880 Allergy status to penicillin: Secondary | ICD-10-CM | POA: Insufficient documentation

## 2020-01-11 DIAGNOSIS — Z803 Family history of malignant neoplasm of breast: Secondary | ICD-10-CM | POA: Insufficient documentation

## 2020-01-11 DIAGNOSIS — Z8249 Family history of ischemic heart disease and other diseases of the circulatory system: Secondary | ICD-10-CM | POA: Diagnosis not present

## 2020-01-11 DIAGNOSIS — Z79810 Long term (current) use of selective estrogen receptor modulators (SERMs): Secondary | ICD-10-CM | POA: Insufficient documentation

## 2020-01-11 DIAGNOSIS — R232 Flushing: Secondary | ICD-10-CM | POA: Insufficient documentation

## 2020-01-11 DIAGNOSIS — M542 Cervicalgia: Secondary | ICD-10-CM | POA: Diagnosis not present

## 2020-01-11 DIAGNOSIS — Z923 Personal history of irradiation: Secondary | ICD-10-CM | POA: Insufficient documentation

## 2020-01-11 DIAGNOSIS — Z882 Allergy status to sulfonamides status: Secondary | ICD-10-CM | POA: Insufficient documentation

## 2020-01-11 DIAGNOSIS — Z171 Estrogen receptor negative status [ER-]: Secondary | ICD-10-CM | POA: Diagnosis not present

## 2020-01-11 DIAGNOSIS — Z823 Family history of stroke: Secondary | ICD-10-CM | POA: Insufficient documentation

## 2020-01-11 DIAGNOSIS — M8580 Other specified disorders of bone density and structure, unspecified site: Secondary | ICD-10-CM | POA: Diagnosis not present

## 2020-01-11 DIAGNOSIS — C50812 Malignant neoplasm of overlapping sites of left female breast: Secondary | ICD-10-CM | POA: Insufficient documentation

## 2020-01-11 DIAGNOSIS — I6523 Occlusion and stenosis of bilateral carotid arteries: Secondary | ICD-10-CM | POA: Insufficient documentation

## 2020-01-11 DIAGNOSIS — Z800 Family history of malignant neoplasm of digestive organs: Secondary | ICD-10-CM | POA: Diagnosis not present

## 2020-01-11 LAB — CBC WITH DIFFERENTIAL (CANCER CENTER ONLY)
Abs Immature Granulocytes: 0.01 10*3/uL (ref 0.00–0.07)
Basophils Absolute: 0 10*3/uL (ref 0.0–0.1)
Basophils Relative: 1 %
Eosinophils Absolute: 0.1 10*3/uL (ref 0.0–0.5)
Eosinophils Relative: 3 %
HCT: 39.2 % (ref 36.0–46.0)
Hemoglobin: 13.6 g/dL (ref 12.0–15.0)
Immature Granulocytes: 0 %
Lymphocytes Relative: 19 %
Lymphs Abs: 1.1 10*3/uL (ref 0.7–4.0)
MCH: 29.9 pg (ref 26.0–34.0)
MCHC: 34.7 g/dL (ref 30.0–36.0)
MCV: 86.2 fL (ref 80.0–100.0)
Monocytes Absolute: 0.6 10*3/uL (ref 0.1–1.0)
Monocytes Relative: 10 %
Neutro Abs: 3.7 10*3/uL (ref 1.7–7.7)
Neutrophils Relative %: 67 %
Platelet Count: 279 10*3/uL (ref 150–400)
RBC: 4.55 MIL/uL (ref 3.87–5.11)
RDW: 13 % (ref 11.5–15.5)
WBC Count: 5.6 10*3/uL (ref 4.0–10.5)
nRBC: 0 % (ref 0.0–0.2)

## 2020-01-11 LAB — CMP (CANCER CENTER ONLY)
ALT: 21 U/L (ref 0–44)
AST: 25 U/L (ref 15–41)
Albumin: 3.9 g/dL (ref 3.5–5.0)
Alkaline Phosphatase: 76 U/L (ref 38–126)
Anion gap: 9 (ref 5–15)
BUN: 17 mg/dL (ref 8–23)
CO2: 24 mmol/L (ref 22–32)
Calcium: 9.6 mg/dL (ref 8.9–10.3)
Chloride: 109 mmol/L (ref 98–111)
Creatinine: 0.77 mg/dL (ref 0.44–1.00)
GFR, Est AFR Am: >60 mL/min (ref >60)
GFR, Estimated: >60 mL/min (ref >60)
Glucose, Bld: 105 mg/dL — ABNORMAL HIGH (ref 70–99)
Potassium: 3.4 mmol/L — ABNORMAL LOW (ref 3.5–5.1)
Sodium: 142 mmol/L (ref 135–145)
Total Bilirubin: 0.8 mg/dL (ref 0.3–1.2)
Total Protein: 6.8 g/dL (ref 6.5–8.1)

## 2020-01-31 ENCOUNTER — Ambulatory Visit: Payer: Medicare HMO | Admitting: Dermatology

## 2020-02-22 ENCOUNTER — Other Ambulatory Visit: Payer: Self-pay | Admitting: Oncology

## 2020-02-28 ENCOUNTER — Other Ambulatory Visit: Payer: Self-pay

## 2020-02-28 ENCOUNTER — Other Ambulatory Visit: Payer: Medicare HMO

## 2020-02-28 DIAGNOSIS — E785 Hyperlipidemia, unspecified: Secondary | ICD-10-CM | POA: Diagnosis not present

## 2020-02-28 DIAGNOSIS — I2510 Atherosclerotic heart disease of native coronary artery without angina pectoris: Secondary | ICD-10-CM | POA: Diagnosis not present

## 2020-02-28 LAB — LIPID PANEL
Chol/HDL Ratio: 3.2 ratio (ref 0.0–4.4)
Cholesterol, Total: 153 mg/dL (ref 100–199)
HDL: 48 mg/dL (ref >39)
LDL Chol Calc (NIH): 75 mg/dL (ref 0–99)
Triglycerides: 181 mg/dL — ABNORMAL HIGH (ref 0–149)
VLDL Cholesterol Cal: 30 mg/dL (ref 5–40)

## 2020-02-28 LAB — HEPATIC FUNCTION PANEL
ALT: 16 IU/L (ref 0–32)
AST: 24 IU/L (ref 0–40)
Albumin: 4.4 g/dL (ref 3.7–4.7)
Alkaline Phosphatase: 82 IU/L (ref 44–121)
Bilirubin Total: 0.9 mg/dL (ref 0.0–1.2)
Bilirubin, Direct: 0.21 mg/dL (ref 0.00–0.40)
Total Protein: 6.7 g/dL (ref 6.0–8.5)

## 2020-03-01 DIAGNOSIS — M8580 Other specified disorders of bone density and structure, unspecified site: Secondary | ICD-10-CM | POA: Diagnosis not present

## 2020-03-01 DIAGNOSIS — C50919 Malignant neoplasm of unspecified site of unspecified female breast: Secondary | ICD-10-CM | POA: Diagnosis not present

## 2020-03-01 DIAGNOSIS — I2510 Atherosclerotic heart disease of native coronary artery without angina pectoris: Secondary | ICD-10-CM | POA: Diagnosis not present

## 2020-03-01 DIAGNOSIS — J4520 Mild intermittent asthma, uncomplicated: Secondary | ICD-10-CM | POA: Diagnosis not present

## 2020-03-01 DIAGNOSIS — E782 Mixed hyperlipidemia: Secondary | ICD-10-CM | POA: Diagnosis not present

## 2020-03-01 DIAGNOSIS — E039 Hypothyroidism, unspecified: Secondary | ICD-10-CM | POA: Diagnosis not present

## 2020-03-01 DIAGNOSIS — I10 Essential (primary) hypertension: Secondary | ICD-10-CM | POA: Diagnosis not present

## 2020-03-01 DIAGNOSIS — I5032 Chronic diastolic (congestive) heart failure: Secondary | ICD-10-CM | POA: Diagnosis not present

## 2020-03-01 DIAGNOSIS — Z853 Personal history of malignant neoplasm of breast: Secondary | ICD-10-CM | POA: Diagnosis not present

## 2020-03-08 ENCOUNTER — Telehealth: Payer: Self-pay | Admitting: Interventional Cardiology

## 2020-03-08 NOTE — Telephone Encounter (Signed)
I don't think so. Okay to stop Zetia for 2 weeks to see if it resolves. Let us know outcome.

## 2020-03-08 NOTE — Telephone Encounter (Signed)
Spoke to pt to give her lab results. She is c/o bilateral LE "tingling/burning" feeling (R>L) x 2 weeks. Denies swelling, redness, warmth. Pt is wondering if this could be from [place]. I have sent to MD.

## 2020-03-08 NOTE — Telephone Encounter (Signed)
Per MD, pt would like to try stopping Zetia for 2 weeks to see if her bilateral LE "tingling/burning" resolves. She is aware and verbalized understanding that she is to call us back and let us know the outcome in 2 weeks. No further questions/concerns at this time.

## 2020-03-08 NOTE — Telephone Encounter (Signed)
Patient calling for lab results. Patient cannot get into her MyChart.

## 2020-03-16 DIAGNOSIS — I2510 Atherosclerotic heart disease of native coronary artery without angina pectoris: Secondary | ICD-10-CM | POA: Diagnosis not present

## 2020-03-16 DIAGNOSIS — Z0000 Encounter for general adult medical examination without abnormal findings: Secondary | ICD-10-CM | POA: Diagnosis not present

## 2020-03-16 DIAGNOSIS — E039 Hypothyroidism, unspecified: Secondary | ICD-10-CM | POA: Diagnosis not present

## 2020-03-16 DIAGNOSIS — I10 Essential (primary) hypertension: Secondary | ICD-10-CM | POA: Diagnosis not present

## 2020-03-16 DIAGNOSIS — R7301 Impaired fasting glucose: Secondary | ICD-10-CM | POA: Diagnosis not present

## 2020-03-16 DIAGNOSIS — J4520 Mild intermittent asthma, uncomplicated: Secondary | ICD-10-CM | POA: Diagnosis not present

## 2020-03-16 DIAGNOSIS — M8580 Other specified disorders of bone density and structure, unspecified site: Secondary | ICD-10-CM | POA: Diagnosis not present

## 2020-03-16 DIAGNOSIS — Z853 Personal history of malignant neoplasm of breast: Secondary | ICD-10-CM | POA: Diagnosis not present

## 2020-03-16 DIAGNOSIS — Z950 Presence of cardiac pacemaker: Secondary | ICD-10-CM | POA: Diagnosis not present

## 2020-03-16 DIAGNOSIS — I5032 Chronic diastolic (congestive) heart failure: Secondary | ICD-10-CM | POA: Diagnosis not present

## 2020-03-23 DIAGNOSIS — R059 Cough, unspecified: Secondary | ICD-10-CM | POA: Diagnosis not present

## 2020-03-23 DIAGNOSIS — U071 COVID-19: Secondary | ICD-10-CM | POA: Diagnosis not present

## 2020-03-24 ENCOUNTER — Encounter: Payer: Self-pay | Admitting: Oncology

## 2020-03-24 ENCOUNTER — Other Ambulatory Visit: Payer: Self-pay | Admitting: Oncology

## 2020-03-24 DIAGNOSIS — U071 COVID-19: Secondary | ICD-10-CM

## 2020-03-24 NOTE — Progress Notes (Signed)
I connected by phone with  [name]  to discuss the potential use of an new treatment for mild to moderate COVID-19 viral infection in non-hospitalized patients.   This patient is a age/sex that meets the FDA criteria for Emergency Use Authorization of casirivimab\imdevimab.  Has a (+) direct SARS-CoV-2 viral test result 1. Has mild or moderate COVID-19  2. Is ? 75 years of age and weighs ? 40 kg 3. Is NOT hospitalized due to COVID-19 4. Is NOT requiring oxygen therapy or requiring an increase in baseline oxygen flow rate due to COVID-19 5. Is within 10 days of symptom onset 6. Has at least one of the high risk factor(s) for progression to severe COVID-19 and/or hospitalization as defined in EUA. ? Specific high risk criteria :   Symptom onset  03/22/20   I have spoken and communicated the following to the patient or parent/caregiver:   1. FDA has authorized the emergency use of casirivimab\imdevimab for the treatment of mild to moderate COVID-19 in adults and pediatric patients with positive results of direct SARS-CoV-2 viral testing who are 86 years of age and older weighing at least 40 kg, and who are at high risk for progressing to severe COVID-19 and/or hospitalization.   2. The significant known and potential risks and benefits of casirivimab\imdevimab, and the extent to which such potential risks and benefits are unknown.   3. Information on available alternative treatments and the risks and benefits of those alternatives, including clinical trials.   4. Patients treated with casirivimab\imdevimab should continue to self-isolate and use infection control measures (e.g., wear mask, isolate, social distance, avoid sharing personal items, clean and disinfect "high touch" surfaces, and frequent handwashing) according to CDC guidelines.    5. The patient or parent/caregiver has the option to accept or refuse casirivimab\imdevimab .   After reviewing this information with the patient,  The patient agreed to proceed with receiving casirivimab\imdevimab infusion and will be provided a copy of the Fact sheet prior to receiving the infusion.Rulon Abide, AGNP-C [phone] ([place])

## 2020-03-25 ENCOUNTER — Ambulatory Visit (HOSPITAL_COMMUNITY)
Admission: RE | Admit: 2020-03-25 | Discharge: 2020-03-25 | Disposition: A | Discharge status: Home / Self Care | Payer: Medicare Other | Source: Ambulatory Visit | Attending: Pulmonary Disease | Admitting: Pulmonary Disease

## 2020-03-25 DIAGNOSIS — U071 COVID-19: Secondary | ICD-10-CM | POA: Diagnosis present

## 2020-03-25 DIAGNOSIS — Z23 Encounter for immunization: Secondary | ICD-10-CM | POA: Diagnosis not present

## 2020-03-25 MED ORDER — FAMOTIDINE IN NACL 20-0.9 MG/50ML-% IV SOLN: 20.0000 mg | Freq: Once | INTRAVENOUS | Status: DC | PRN

## 2020-03-25 MED ORDER — DIPHENHYDRAMINE HCL 50 MG/ML IJ SOLN: 50.0000 mg | Freq: Once | INTRAMUSCULAR | Status: DC | PRN

## 2020-03-25 MED ORDER — METHYLPREDNISOLONE SODIUM SUCC 125 MG IJ SOLR: 125.0000 mg | Freq: Once | INTRAMUSCULAR | Status: DC | PRN

## 2020-03-25 MED ORDER — SODIUM CHLORIDE 0.9 % IV SOLN: Freq: Once | INTRAVENOUS | Status: AC

## 2020-03-25 MED ORDER — SODIUM CHLORIDE 0.9 % IV SOLN: INTRAVENOUS | Status: DC | PRN

## 2020-03-25 MED ORDER — EPINEPHRINE 0.3 MG/0.3ML IJ SOAJ: 0.3000 mg | Freq: Once | INTRAMUSCULAR | Status: DC | PRN

## 2020-03-25 MED ORDER — ALBUTEROL SULFATE HFA 108 (90 BASE) MCG/ACT IN AERS: 2.0000 | INHALATION_SPRAY | Freq: Once | RESPIRATORY_TRACT | Status: DC | PRN

## 2020-03-25 NOTE — Progress Notes (Signed)
  Diagnosis: COVID-19  Physician:Dr. Joya Gaskins  Procedure: [facility] Med: bamlanivimab\etesevimab infusion - Provided patient with bamlanimivab\etesevimab fact sheet for patients, parents and caregivers prior to infusion.  Complications: No immediate complications noted.  Discharge: Discharged home   [name] 03/25/2020

## 2020-03-25 NOTE — Discharge Instructions (Signed)

## 2020-03-28 ENCOUNTER — Other Ambulatory Visit (HOSPITAL_BASED_OUTPATIENT_CLINIC_OR_DEPARTMENT_OTHER): Payer: Self-pay | Admitting: Emergency Medicine

## 2020-03-28 ENCOUNTER — Emergency Department (HOSPITAL_BASED_OUTPATIENT_CLINIC_OR_DEPARTMENT_OTHER)
Admission: EM | Admit: 2020-03-28 | Discharge: 2020-03-28 | Disposition: A | Discharge status: Home / Self Care | Payer: Medicare HMO | Attending: Emergency Medicine | Admitting: Emergency Medicine

## 2020-03-28 ENCOUNTER — Other Ambulatory Visit: Payer: Self-pay

## 2020-03-28 ENCOUNTER — Emergency Department (HOSPITAL_BASED_OUTPATIENT_CLINIC_OR_DEPARTMENT_OTHER): Payer: Medicare HMO

## 2020-03-28 ENCOUNTER — Encounter (HOSPITAL_BASED_OUTPATIENT_CLINIC_OR_DEPARTMENT_OTHER): Payer: Self-pay | Admitting: *Deleted

## 2020-03-28 DIAGNOSIS — Z79899 Other long term (current) drug therapy: Secondary | ICD-10-CM | POA: Diagnosis not present

## 2020-03-28 DIAGNOSIS — I5032 Chronic diastolic (congestive) heart failure: Secondary | ICD-10-CM | POA: Diagnosis not present

## 2020-03-28 DIAGNOSIS — U071 COVID-19: Secondary | ICD-10-CM | POA: Diagnosis not present

## 2020-03-28 DIAGNOSIS — Z950 Presence of cardiac pacemaker: Secondary | ICD-10-CM | POA: Diagnosis not present

## 2020-03-28 DIAGNOSIS — I517 Cardiomegaly: Secondary | ICD-10-CM | POA: Diagnosis not present

## 2020-03-28 DIAGNOSIS — I2510 Atherosclerotic heart disease of native coronary artery without angina pectoris: Secondary | ICD-10-CM | POA: Diagnosis not present

## 2020-03-28 DIAGNOSIS — Z853 Personal history of malignant neoplasm of breast: Secondary | ICD-10-CM | POA: Diagnosis not present

## 2020-03-28 DIAGNOSIS — R059 Cough, unspecified: Secondary | ICD-10-CM | POA: Diagnosis present

## 2020-03-28 DIAGNOSIS — Z7982 Long term (current) use of aspirin: Secondary | ICD-10-CM | POA: Insufficient documentation

## 2020-03-28 DIAGNOSIS — E039 Hypothyroidism, unspecified: Secondary | ICD-10-CM | POA: Diagnosis not present

## 2020-03-28 DIAGNOSIS — I110 Hypertensive heart disease with heart failure: Secondary | ICD-10-CM | POA: Diagnosis not present

## 2020-03-28 MED ORDER — PROMETHAZINE-DM 6.25-15 MG/5ML PO SYRP: 5.0000 mL | ORAL_SOLUTION | Freq: Four times a day (QID) | ORAL | 0 refills | Status: DC | PRN

## 2020-03-28 MED ORDER — ALBUTEROL SULFATE HFA 108 (90 BASE) MCG/ACT IN AERS: 2.0000 | INHALATION_SPRAY | RESPIRATORY_TRACT | 0 refills | Status: DC | PRN

## 2020-03-28 MED FILL — PROMETHAZINE W/DM SYRUP: 6.25-15 | 4 days supply | Qty: 80 | Fill #0

## 2020-03-28 MED FILL — VENTOLIN HFA 90 MCG INHALER: 108 (90 BAS | 17 days supply | Qty: 18 | Fill #0

## 2020-03-28 NOTE — ED Triage Notes (Signed)
C/o cough , sent here for chest xray [place] positive x 5 days

## 2020-03-28 NOTE — ED Provider Notes (Signed)
[facility] EMERGENCY DEPARTMENT Provider Note   CSN: [id number] Arrival date & time: 03/28/20  1433     History Chief Complaint  Patient presents with   Cough    [name] is a 74 y.o. female.  74 year old female with past medical history below including breast cancer, hypertension, hyperlipidemia, hypothyroidism, pacemaker who presents with cough from COVID-19.  Patient began getting sick with COVID-19 approximately 5 days ago and subsequently tested positive.  She had outpatient monoclonal antibody infusion a few days ago.  She states her cough has slightly worsened since it began but she denies any significant breathing problems.  She reports decreased appetite but has been drinking plenty of fluids and denies any vomiting or diarrhea.  She has had some nausea.  She reports fatigue and generalized joint pains, no measured fevers.  Has been taking Tessalon Perles and Tylenol occasionally for her symptoms.  The history is provided by the patient.  Cough      Past Medical History:  Diagnosis Date   Allergic rhinitis    Arthritis    Family history of breast cancer    Family history of colon cancer    Family history of pancreatic cancer    GERD (gastroesophageal reflux disease)    GI bleed    History of basal cell cancer 11/08/2008   RIGHT SUBAURICULAR   History of radiation therapy 08/05/2016 - 09/12/2016   Left Breast 50.4 Gy 28 fractions   Hyperlipidemia    Hypertension    Hypothyroidism    Lumbar disc disease    Malignant neoplasm of upper-outer quadrant of left female breast ([place]) 04/11/2016   Pacemaker    Peptic ulcer disease    SCC (squamous cell carcinoma) 09/09/2017   LEFT WRIST CX 3 5FU   Squamous cell carcinoma of skin 05/30/2013   LEFT LOWER BACK   SSS (sick sinus syndrome) ([place])    Thyroid disease     Patient Active Problem List   Diagnosis Date Noted   Chronic diastolic heart failure (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 11/04/2017   Angina pectoris ([place]) 10/09/2017   Dyspnea 10/09/2017   Altered mental state 10/09/2017   Thyroid disease    SSS (sick sinus syndrome) ([place])    Peptic ulcer disease    Pacemaker    Lumbar disc disease    Hypothyroidism    Hypertension    History of radiation therapy    History of basal cell cancer    GERD (gastroesophageal reflux disease)    GI bleed    Arthritis    Allergic rhinitis    Genetic testing 05/01/2016   Family history of breast cancer    Family history of pancreatic cancer    Family history of colon cancer    Malignant neoplasm of upper-outer quadrant of left breast in female, estrogen receptor negative ([place]) 04/11/2016   Malignant neoplasm of upper-outer quadrant of left female breast ([place]) 04/11/2016   Essential hypertension 09/06/2013   Sick sinus syndrome ([place]) 09/06/2013   History of permanent cardiac pacemaker placement 09/06/2013   Hyperlipidemia 09/06/2013    Past Surgical History:  Procedure Laterality Date   ABDOMINAL HYSTERECTOMY     BREAST LUMPECTOMY WITH RADIOACTIVE SEED AND SENTINEL LYMPH NODE BIOPSY Left 06/06/2016   Procedure: LEFT BREAST LUMPECTOMY WITH RADIOACTIVE SEED AND LEFT AXILLARY SENTINEL LYMPH NODE BIOPSY;  Surgeon: Alphonsa Overall, MD;  Location: [facility];  Service: General;  Laterality: Left;   BREAST SURGERY     left   EP IMPLANTABLE DEVICE N/A 05/15/2016   Procedure: PPM Generator Changeout;  Surgeon: Evans Lance, MD;  Location: [facility];  Service: Cardiovascular;  Laterality: N/A;   HERNIA REPAIR     LEFT HEART CATH AND CORONARY ANGIOGRAPHY N/A 10/09/2017   Procedure: LEFT HEART CATH AND CORONARY ANGIOGRAPHY;  Surgeon: Belva Crome, MD;  Location: [facility];  Service: Cardiovascular;  Laterality: N/A;   PACEMAKER INSERTION       OB History   No obstetric history on file.      Family History  Problem Relation Age of Onset   Congestive Heart Failure Mother    Hypertension Father    Bone cancer Father    Breast cancer Sister 24   Bradycardia Cousin    Breast cancer Cousin 7   Breast cancer Cousin 50   Colon cancer Cousin 49   Pancreatic cancer Paternal Aunt 76   Rectal cancer Paternal Aunt 70   Heart attack Maternal Grandmother    Heart attack Paternal Grandmother    Stroke Paternal Grandfather    Melanoma Daughter        dx in her 101s    Social History   Tobacco Use   Smoking status: Never Smoker   Smokeless tobacco: Never Used  Scientific laboratory technician Use: Never used  Substance Use Topics   Alcohol use: No   Drug use: No    Home Medications Prior to Admission medications   Medication Sig Start Date End Date Taking? Authorizing Provider  albuterol (VENTOLIN HFA) 108 (90 Base) MCG/ACT inhaler Inhale 2 puffs into the lungs every 4 (four) hours as needed for wheezing or shortness of breath. 03/28/20   Olisa Quesnel, Wenda Overland, MD  aspirin EC 81 MG tablet Take 1 tablet (81 mg total) by mouth every Monday, Wednesday, and Friday. Swallow whole. 12/21/19   Belva Crome, MD  atorvastatin (LIPITOR) 80 MG tablet Take 1 tablet (80 mg total) by mouth daily. 11/04/17 12/08/18  Belva Crome, MD  ezetimibe (ZETIA) 10 MG tablet Take 1 tablet (10 mg total) by mouth daily. 12/26/19 03/25/20  Belva Crome, MD  levothyroxine (SYNTHROID) 88 MCG tablet Take 88 mcg by mouth daily. 12/07/19   [provider]  mometasone (NASONEX) 50 MCG/ACT nasal spray Place 2 sprays into the nose daily.    [provider]  Omega-3 Fatty Acids (FISH OIL) 1000 MG CAPS Take 1,000 mg by mouth daily.    [provider]  omeprazole (PRILOSEC) 20 MG capsule Take 20 mg by mouth daily.    [provider]  ondansetron (ZOFRAN) 4 MG tablet Take 1 tablet (4 mg total) by mouth every 8 (eight) hours as needed for nausea or vomiting. 12/04/19   Hayden Rasmussen, MD  promethazine-dextromethorphan (PROMETHAZINE-DM) 6.25-15 MG/5ML syrup Take 5 mLs by mouth 4 (four) times daily as needed for up to 5 days for cough. 03/28/20 04/02/20  Zaylie Gisler, Wenda Overland, MD  tamoxifen (NOLVADEX) 20 MG tablet TAKE ONE TABLET BY MOUTH EVERY MORNING 02/22/20   Magrinat, Virgie Dad, MD  triamterene-hydrochlorothiazide (MAXZIDE) 75-50 MG tablet Take 1 tablet by mouth every evening.     [provider]    Allergies    Bee venom, Morphine and related, Nsaids, Wasp venom, Penicillins, and Sulfa antibiotics  Review of Systems   Review of Systems  Respiratory: Positive for cough.    All  other systems reviewed and are negative except that which was mentioned in HPI  Physical Exam Updated Vital Signs BP (!) 157/93    Pulse 63    Temp 97.8 F (36.6 C) (Oral)    Resp 18    Ht 5\' 2"  (1.575 m)    Wt 67.1 kg    SpO2 100%    BMI 27.07 kg/m   Physical Exam Vitals and nursing note reviewed.  Constitutional:      General: She is not in acute distress.    Appearance: She is well-developed.  HENT:     Head: Normocephalic and atraumatic.  Eyes:     Conjunctiva/sclera: Conjunctivae normal.  Cardiovascular:     Rate and Rhythm: Normal rate and regular rhythm.     Heart sounds: Normal heart sounds. No murmur heard.   Pulmonary:     Effort: Pulmonary effort is normal.     Breath sounds: Normal breath sounds. No wheezing.     Comments: Frequent dry cough Abdominal:     General: Bowel sounds are normal. There is no distension.     Palpations: Abdomen is soft.     Tenderness: There is no abdominal tenderness.  Musculoskeletal:     Cervical back: Neck supple.     Right lower leg: No edema.     Left lower leg: No edema.  Skin:    General: Skin is warm and dry.  Neurological:     Mental Status: She is alert and oriented to person, place, and time.     Comments: Fluent speech  Psychiatric:        Judgment: Judgment normal.     ED Results / Procedures /  Treatments   Labs (all labs ordered are listed, but only abnormal results are displayed) Labs Reviewed - No data to display  EKG None  Radiology DG Chest Portable 1 View  Result Date: 03/28/2020 CLINICAL DATA:  COVID positive. EXAM: PORTABLE CHEST 1 VIEW COMPARISON:  Oct 02, 2017 FINDINGS: Enlarged cardiac silhouette. Aortic atherosclerosis. Similar positioning of a cardiac rhythm maintenance device. No consolidation. No visible pleural effusions or pneumothorax. No acute osseous abnormality. IMPRESSION: 1. No acute cardiopulmonary disease. 2. Cardiomegaly. Electronically Signed   By: Margaretha Sheffield MD   On: 03/28/2020 15:26    Procedures Procedures (including critical care time)  Medications Ordered in ED Medications - No data to display  ED Course  I have reviewed the triage vital signs and the nursing notes.  Pertinent imaging results that were available during my care of the patient were reviewed by me and considered in my medical decision making (see chart for details).    MDM Rules/Calculators/A&P                          Comfortable on exam, 98% on RA, normal WOB. No wheezes. She states she gets bronchitis several times/year, no wheezing currently but offered albuterol to try. CXR negative acute. Has been staying hydrated. Reiterated supportive measures, PCP f/u of symptoms, and return precautions.  [name] was evaluated in Emergency Department on 03/28/2020 for the symptoms described in the history of present illness. She was evaluated in the context of the global COVID-19 pandemic, which necessitated consideration that the patient might be at risk for infection with the SARS-CoV-2 virus that causes COVID-19. Institutional protocols and algorithms that pertain to the evaluation of patients at risk for COVID-19 are in a state of rapid change based on information  released by regulatory bodies including the CDC and federal and state organizations. These policies and  algorithms were followed during the patient's care in the ED.  Final Clinical Impression(s) / ED Diagnoses Final diagnoses:  COVID-19 virus infection    Rx / DC Orders ED Discharge Orders         Ordered    promethazine-dextromethorphan (PROMETHAZINE-DM) 6.25-15 MG/5ML syrup  4 times daily PRN        03/28/20 1523    albuterol (VENTOLIN HFA) 108 (90 Base) MCG/ACT inhaler  Every 4 hours PRN        03/28/20 1523           Milik Gilreath, Wenda Overland, MD 03/28/20 1542

## 2020-03-29 DIAGNOSIS — J4520 Mild intermittent asthma, uncomplicated: Secondary | ICD-10-CM | POA: Diagnosis not present

## 2020-03-29 DIAGNOSIS — C50919 Malignant neoplasm of unspecified site of unspecified female breast: Secondary | ICD-10-CM | POA: Diagnosis not present

## 2020-03-29 DIAGNOSIS — I5032 Chronic diastolic (congestive) heart failure: Secondary | ICD-10-CM | POA: Diagnosis not present

## 2020-03-29 DIAGNOSIS — M8580 Other specified disorders of bone density and structure, unspecified site: Secondary | ICD-10-CM | POA: Diagnosis not present

## 2020-03-29 DIAGNOSIS — Z853 Personal history of malignant neoplasm of breast: Secondary | ICD-10-CM | POA: Diagnosis not present

## 2020-03-29 DIAGNOSIS — I2510 Atherosclerotic heart disease of native coronary artery without angina pectoris: Secondary | ICD-10-CM | POA: Diagnosis not present

## 2020-03-29 DIAGNOSIS — E039 Hypothyroidism, unspecified: Secondary | ICD-10-CM | POA: Diagnosis not present

## 2020-03-29 DIAGNOSIS — E782 Mixed hyperlipidemia: Secondary | ICD-10-CM | POA: Diagnosis not present

## 2020-03-29 DIAGNOSIS — I10 Essential (primary) hypertension: Secondary | ICD-10-CM | POA: Diagnosis not present

## 2020-03-30 DIAGNOSIS — R059 Cough, unspecified: Secondary | ICD-10-CM | POA: Diagnosis not present

## 2020-04-20 DIAGNOSIS — Z853 Personal history of malignant neoplasm of breast: Secondary | ICD-10-CM | POA: Diagnosis not present

## 2020-04-30 DIAGNOSIS — E782 Mixed hyperlipidemia: Secondary | ICD-10-CM | POA: Diagnosis not present

## 2020-04-30 DIAGNOSIS — Z853 Personal history of malignant neoplasm of breast: Secondary | ICD-10-CM | POA: Diagnosis not present

## 2020-04-30 DIAGNOSIS — C50919 Malignant neoplasm of unspecified site of unspecified female breast: Secondary | ICD-10-CM | POA: Diagnosis not present

## 2020-04-30 DIAGNOSIS — I10 Essential (primary) hypertension: Secondary | ICD-10-CM | POA: Diagnosis not present

## 2020-04-30 DIAGNOSIS — I5032 Chronic diastolic (congestive) heart failure: Secondary | ICD-10-CM | POA: Diagnosis not present

## 2020-04-30 DIAGNOSIS — J4520 Mild intermittent asthma, uncomplicated: Secondary | ICD-10-CM | POA: Diagnosis not present

## 2020-04-30 DIAGNOSIS — I2510 Atherosclerotic heart disease of native coronary artery without angina pectoris: Secondary | ICD-10-CM | POA: Diagnosis not present

## 2020-04-30 DIAGNOSIS — E039 Hypothyroidism, unspecified: Secondary | ICD-10-CM | POA: Diagnosis not present

## 2020-04-30 DIAGNOSIS — M8580 Other specified disorders of bone density and structure, unspecified site: Secondary | ICD-10-CM | POA: Diagnosis not present

## 2020-05-21 ENCOUNTER — Encounter: Payer: Self-pay | Admitting: Oncology

## 2020-05-30 DIAGNOSIS — Z853 Personal history of malignant neoplasm of breast: Secondary | ICD-10-CM | POA: Diagnosis not present

## 2020-05-30 DIAGNOSIS — E782 Mixed hyperlipidemia: Secondary | ICD-10-CM | POA: Diagnosis not present

## 2020-05-30 DIAGNOSIS — I10 Essential (primary) hypertension: Secondary | ICD-10-CM | POA: Diagnosis not present

## 2020-05-30 DIAGNOSIS — I2510 Atherosclerotic heart disease of native coronary artery without angina pectoris: Secondary | ICD-10-CM | POA: Diagnosis not present

## 2020-05-30 DIAGNOSIS — I5032 Chronic diastolic (congestive) heart failure: Secondary | ICD-10-CM | POA: Diagnosis not present

## 2020-05-30 DIAGNOSIS — M8580 Other specified disorders of bone density and structure, unspecified site: Secondary | ICD-10-CM | POA: Diagnosis not present

## 2020-05-30 DIAGNOSIS — E039 Hypothyroidism, unspecified: Secondary | ICD-10-CM | POA: Diagnosis not present

## 2020-05-30 DIAGNOSIS — C50919 Malignant neoplasm of unspecified site of unspecified female breast: Secondary | ICD-10-CM | POA: Diagnosis not present

## 2020-05-30 DIAGNOSIS — J4520 Mild intermittent asthma, uncomplicated: Secondary | ICD-10-CM | POA: Diagnosis not present

## 2020-05-31 ENCOUNTER — Ambulatory Visit (INDEPENDENT_AMBULATORY_CARE_PROVIDER_SITE_OTHER): Payer: Medicare HMO

## 2020-05-31 DIAGNOSIS — I495 Sick sinus syndrome: Secondary | ICD-10-CM

## 2020-05-31 LAB — CUP PACEART REMOTE DEVICE CHECK
Battery Impedance: 255 Ohm
Battery Remaining Longevity: 96 mo
Battery Voltage: 2.78 V
Brady Statistic AP VP Percent: 1 %
Brady Statistic AP VS Percent: 77 %
Brady Statistic AS VP Percent: 0 %
Brady Statistic AS VS Percent: 22 %
Date Time Interrogation Session: 20211229164037
Implantable Lead Implant Date: 19970926
Implantable Lead Implant Date: 19970926
Implantable Lead Location: 753859
Implantable Lead Location: 753860
Implantable Lead Model: 4024
Implantable Lead Model: 4524
Implantable Pulse Generator Implant Date: 20171214
Lead Channel Impedance Value: 326 Ohm
Lead Channel Impedance Value: 983 Ohm
Lead Channel Pacing Threshold Amplitude: 0.75 V
Lead Channel Pacing Threshold Amplitude: 1.5 V
Lead Channel Pacing Threshold Pulse Width: 0.4 ms
Lead Channel Pacing Threshold Pulse Width: 0.4 ms
Lead Channel Setting Pacing Amplitude: 2.5 V
Lead Channel Setting Pacing Amplitude: 2.5 V
Lead Channel Setting Pacing Pulse Width: 0.4 ms
Lead Channel Setting Sensing Sensitivity: 5.6 mV

## 2020-06-08 ENCOUNTER — Other Ambulatory Visit: Payer: Self-pay | Admitting: Oncology

## 2020-06-15 NOTE — Progress Notes (Signed)
Remote pacemaker transmission.   

## 2020-06-20 DIAGNOSIS — I5032 Chronic diastolic (congestive) heart failure: Secondary | ICD-10-CM | POA: Diagnosis not present

## 2020-06-20 DIAGNOSIS — E782 Mixed hyperlipidemia: Secondary | ICD-10-CM | POA: Diagnosis not present

## 2020-06-20 DIAGNOSIS — Z853 Personal history of malignant neoplasm of breast: Secondary | ICD-10-CM | POA: Diagnosis not present

## 2020-06-20 DIAGNOSIS — I2510 Atherosclerotic heart disease of native coronary artery without angina pectoris: Secondary | ICD-10-CM | POA: Diagnosis not present

## 2020-06-20 DIAGNOSIS — J4520 Mild intermittent asthma, uncomplicated: Secondary | ICD-10-CM | POA: Diagnosis not present

## 2020-06-20 DIAGNOSIS — I10 Essential (primary) hypertension: Secondary | ICD-10-CM | POA: Diagnosis not present

## 2020-06-20 DIAGNOSIS — M8580 Other specified disorders of bone density and structure, unspecified site: Secondary | ICD-10-CM | POA: Diagnosis not present

## 2020-06-20 DIAGNOSIS — E039 Hypothyroidism, unspecified: Secondary | ICD-10-CM | POA: Diagnosis not present

## 2020-06-20 DIAGNOSIS — C50919 Malignant neoplasm of unspecified site of unspecified female breast: Secondary | ICD-10-CM | POA: Diagnosis not present

## 2020-07-24 DIAGNOSIS — I2510 Atherosclerotic heart disease of native coronary artery without angina pectoris: Secondary | ICD-10-CM | POA: Diagnosis not present

## 2020-07-24 DIAGNOSIS — I10 Essential (primary) hypertension: Secondary | ICD-10-CM | POA: Diagnosis not present

## 2020-07-24 DIAGNOSIS — M8580 Other specified disorders of bone density and structure, unspecified site: Secondary | ICD-10-CM | POA: Diagnosis not present

## 2020-07-24 DIAGNOSIS — I5032 Chronic diastolic (congestive) heart failure: Secondary | ICD-10-CM | POA: Diagnosis not present

## 2020-07-24 DIAGNOSIS — J4520 Mild intermittent asthma, uncomplicated: Secondary | ICD-10-CM | POA: Diagnosis not present

## 2020-07-24 DIAGNOSIS — E782 Mixed hyperlipidemia: Secondary | ICD-10-CM | POA: Diagnosis not present

## 2020-07-24 DIAGNOSIS — E039 Hypothyroidism, unspecified: Secondary | ICD-10-CM | POA: Diagnosis not present

## 2020-07-24 DIAGNOSIS — Z853 Personal history of malignant neoplasm of breast: Secondary | ICD-10-CM | POA: Diagnosis not present

## 2020-08-24 DIAGNOSIS — I10 Essential (primary) hypertension: Secondary | ICD-10-CM | POA: Diagnosis not present

## 2020-08-24 DIAGNOSIS — Z853 Personal history of malignant neoplasm of breast: Secondary | ICD-10-CM | POA: Diagnosis not present

## 2020-08-24 DIAGNOSIS — J4520 Mild intermittent asthma, uncomplicated: Secondary | ICD-10-CM | POA: Diagnosis not present

## 2020-08-24 DIAGNOSIS — E039 Hypothyroidism, unspecified: Secondary | ICD-10-CM | POA: Diagnosis not present

## 2020-08-24 DIAGNOSIS — C50919 Malignant neoplasm of unspecified site of unspecified female breast: Secondary | ICD-10-CM | POA: Diagnosis not present

## 2020-08-24 DIAGNOSIS — I5032 Chronic diastolic (congestive) heart failure: Secondary | ICD-10-CM | POA: Diagnosis not present

## 2020-08-24 DIAGNOSIS — M8580 Other specified disorders of bone density and structure, unspecified site: Secondary | ICD-10-CM | POA: Diagnosis not present

## 2020-08-24 DIAGNOSIS — E782 Mixed hyperlipidemia: Secondary | ICD-10-CM | POA: Diagnosis not present

## 2020-08-24 DIAGNOSIS — I2510 Atherosclerotic heart disease of native coronary artery without angina pectoris: Secondary | ICD-10-CM | POA: Diagnosis not present

## 2020-08-30 ENCOUNTER — Other Ambulatory Visit: Payer: Self-pay

## 2020-09-21 ENCOUNTER — Ambulatory Visit (INDEPENDENT_AMBULATORY_CARE_PROVIDER_SITE_OTHER): Payer: Medicare HMO

## 2020-09-21 DIAGNOSIS — I495 Sick sinus syndrome: Secondary | ICD-10-CM

## 2020-09-22 LAB — CUP PACEART REMOTE DEVICE CHECK
Battery Impedance: 303 Ohm
Battery Remaining Longevity: 90 mo
Battery Voltage: 2.78 V
Brady Statistic AP VP Percent: 1 %
Brady Statistic AP VS Percent: 77 %
Brady Statistic AS VP Percent: 0 %
Brady Statistic AS VS Percent: 22 %
Date Time Interrogation Session: 20220422160023
Implantable Lead Implant Date: 19970926
Implantable Lead Implant Date: 19970926
Implantable Lead Location: 753859
Implantable Lead Location: 753860
Implantable Lead Model: 4024
Implantable Lead Model: 4524
Implantable Pulse Generator Implant Date: 20171214
Lead Channel Impedance Value: 322 Ohm
Lead Channel Impedance Value: 949 Ohm
Lead Channel Pacing Threshold Amplitude: 0.625 V
Lead Channel Pacing Threshold Amplitude: 1.625 V
Lead Channel Pacing Threshold Pulse Width: 0.4 ms
Lead Channel Pacing Threshold Pulse Width: 0.4 ms
Lead Channel Setting Pacing Amplitude: 2.5 V
Lead Channel Setting Pacing Amplitude: 2.5 V
Lead Channel Setting Pacing Pulse Width: 0.4 ms
Lead Channel Setting Sensing Sensitivity: 5.6 mV

## 2020-09-25 DIAGNOSIS — I5032 Chronic diastolic (congestive) heart failure: Secondary | ICD-10-CM | POA: Diagnosis not present

## 2020-09-25 DIAGNOSIS — J4520 Mild intermittent asthma, uncomplicated: Secondary | ICD-10-CM | POA: Diagnosis not present

## 2020-09-25 DIAGNOSIS — I10 Essential (primary) hypertension: Secondary | ICD-10-CM | POA: Diagnosis not present

## 2020-09-25 DIAGNOSIS — I2510 Atherosclerotic heart disease of native coronary artery without angina pectoris: Secondary | ICD-10-CM | POA: Diagnosis not present

## 2020-09-25 DIAGNOSIS — M8580 Other specified disorders of bone density and structure, unspecified site: Secondary | ICD-10-CM | POA: Diagnosis not present

## 2020-09-25 DIAGNOSIS — E782 Mixed hyperlipidemia: Secondary | ICD-10-CM | POA: Diagnosis not present

## 2020-09-25 DIAGNOSIS — E039 Hypothyroidism, unspecified: Secondary | ICD-10-CM | POA: Diagnosis not present

## 2020-09-25 DIAGNOSIS — Z853 Personal history of malignant neoplasm of breast: Secondary | ICD-10-CM | POA: Diagnosis not present

## 2020-09-25 DIAGNOSIS — C50919 Malignant neoplasm of unspecified site of unspecified female breast: Secondary | ICD-10-CM | POA: Diagnosis not present

## 2020-10-10 NOTE — Progress Notes (Signed)
Remote pacemaker transmission.   

## 2020-10-23 DIAGNOSIS — M8580 Other specified disorders of bone density and structure, unspecified site: Secondary | ICD-10-CM | POA: Diagnosis not present

## 2020-10-23 DIAGNOSIS — I5032 Chronic diastolic (congestive) heart failure: Secondary | ICD-10-CM | POA: Diagnosis not present

## 2020-10-23 DIAGNOSIS — I10 Essential (primary) hypertension: Secondary | ICD-10-CM | POA: Diagnosis not present

## 2020-10-23 DIAGNOSIS — J4520 Mild intermittent asthma, uncomplicated: Secondary | ICD-10-CM | POA: Diagnosis not present

## 2020-10-23 DIAGNOSIS — C50919 Malignant neoplasm of unspecified site of unspecified female breast: Secondary | ICD-10-CM | POA: Diagnosis not present

## 2020-10-23 DIAGNOSIS — E039 Hypothyroidism, unspecified: Secondary | ICD-10-CM | POA: Diagnosis not present

## 2020-10-23 DIAGNOSIS — Z853 Personal history of malignant neoplasm of breast: Secondary | ICD-10-CM | POA: Diagnosis not present

## 2020-10-23 DIAGNOSIS — E782 Mixed hyperlipidemia: Secondary | ICD-10-CM | POA: Diagnosis not present

## 2020-10-23 DIAGNOSIS — I2510 Atherosclerotic heart disease of native coronary artery without angina pectoris: Secondary | ICD-10-CM | POA: Diagnosis not present

## 2020-11-18 ENCOUNTER — Other Ambulatory Visit: Payer: Self-pay | Admitting: Oncology

## 2020-11-18 ENCOUNTER — Other Ambulatory Visit: Payer: Self-pay | Admitting: Interventional Cardiology

## 2020-11-23 DIAGNOSIS — I5032 Chronic diastolic (congestive) heart failure: Secondary | ICD-10-CM | POA: Diagnosis not present

## 2020-11-23 DIAGNOSIS — J4520 Mild intermittent asthma, uncomplicated: Secondary | ICD-10-CM | POA: Diagnosis not present

## 2020-11-23 DIAGNOSIS — M8580 Other specified disorders of bone density and structure, unspecified site: Secondary | ICD-10-CM | POA: Diagnosis not present

## 2020-11-23 DIAGNOSIS — E782 Mixed hyperlipidemia: Secondary | ICD-10-CM | POA: Diagnosis not present

## 2020-11-23 DIAGNOSIS — I10 Essential (primary) hypertension: Secondary | ICD-10-CM | POA: Diagnosis not present

## 2020-11-23 DIAGNOSIS — E039 Hypothyroidism, unspecified: Secondary | ICD-10-CM | POA: Diagnosis not present

## 2020-11-23 DIAGNOSIS — I2510 Atherosclerotic heart disease of native coronary artery without angina pectoris: Secondary | ICD-10-CM | POA: Diagnosis not present

## 2020-12-21 ENCOUNTER — Ambulatory Visit (INDEPENDENT_AMBULATORY_CARE_PROVIDER_SITE_OTHER): Payer: Medicare HMO

## 2020-12-21 DIAGNOSIS — I495 Sick sinus syndrome: Secondary | ICD-10-CM

## 2020-12-24 DIAGNOSIS — J4520 Mild intermittent asthma, uncomplicated: Secondary | ICD-10-CM | POA: Diagnosis not present

## 2020-12-24 DIAGNOSIS — I10 Essential (primary) hypertension: Secondary | ICD-10-CM | POA: Diagnosis not present

## 2020-12-24 DIAGNOSIS — E039 Hypothyroidism, unspecified: Secondary | ICD-10-CM | POA: Diagnosis not present

## 2020-12-24 DIAGNOSIS — C50919 Malignant neoplasm of unspecified site of unspecified female breast: Secondary | ICD-10-CM | POA: Diagnosis not present

## 2020-12-24 DIAGNOSIS — I5032 Chronic diastolic (congestive) heart failure: Secondary | ICD-10-CM | POA: Diagnosis not present

## 2020-12-24 DIAGNOSIS — M8580 Other specified disorders of bone density and structure, unspecified site: Secondary | ICD-10-CM | POA: Diagnosis not present

## 2020-12-24 DIAGNOSIS — E782 Mixed hyperlipidemia: Secondary | ICD-10-CM | POA: Diagnosis not present

## 2020-12-24 DIAGNOSIS — Z853 Personal history of malignant neoplasm of breast: Secondary | ICD-10-CM | POA: Diagnosis not present

## 2020-12-24 DIAGNOSIS — I2510 Atherosclerotic heart disease of native coronary artery without angina pectoris: Secondary | ICD-10-CM | POA: Diagnosis not present

## 2020-12-26 LAB — CUP PACEART REMOTE DEVICE CHECK
Battery Impedance: 328 Ohm
Battery Remaining Longevity: 88 mo
Battery Voltage: 2.77 V
Brady Statistic AP VP Percent: 1 %
Brady Statistic AP VS Percent: 77 %
Brady Statistic AS VP Percent: 0 %
Brady Statistic AS VS Percent: 21 %
Date Time Interrogation Session: 20220726152415
Implantable Lead Implant Date: 19970926
Implantable Lead Implant Date: 19970926
Implantable Lead Location: 753859
Implantable Lead Location: 753860
Implantable Lead Model: 4024
Implantable Lead Model: 4524
Implantable Pulse Generator Implant Date: 20171214
Lead Channel Impedance Value: 330 Ohm
Lead Channel Impedance Value: 905 Ohm
Lead Channel Pacing Threshold Amplitude: 0.625 V
Lead Channel Pacing Threshold Amplitude: 1.625 V
Lead Channel Pacing Threshold Pulse Width: 0.4 ms
Lead Channel Pacing Threshold Pulse Width: 0.4 ms
Lead Channel Setting Pacing Amplitude: 2.5 V
Lead Channel Setting Pacing Amplitude: 2.5 V
Lead Channel Setting Pacing Pulse Width: 0.4 ms
Lead Channel Setting Sensing Sensitivity: 5.6 mV

## 2021-01-14 NOTE — Progress Notes (Signed)
Remote pacemaker transmission.   

## 2021-01-18 DIAGNOSIS — I2510 Atherosclerotic heart disease of native coronary artery without angina pectoris: Secondary | ICD-10-CM | POA: Diagnosis not present

## 2021-01-18 DIAGNOSIS — E039 Hypothyroidism, unspecified: Secondary | ICD-10-CM | POA: Diagnosis not present

## 2021-01-18 DIAGNOSIS — E782 Mixed hyperlipidemia: Secondary | ICD-10-CM | POA: Diagnosis not present

## 2021-01-18 DIAGNOSIS — I5032 Chronic diastolic (congestive) heart failure: Secondary | ICD-10-CM | POA: Diagnosis not present

## 2021-01-18 DIAGNOSIS — M8580 Other specified disorders of bone density and structure, unspecified site: Secondary | ICD-10-CM | POA: Diagnosis not present

## 2021-01-18 DIAGNOSIS — J4520 Mild intermittent asthma, uncomplicated: Secondary | ICD-10-CM | POA: Diagnosis not present

## 2021-01-18 DIAGNOSIS — I10 Essential (primary) hypertension: Secondary | ICD-10-CM | POA: Diagnosis not present

## 2021-01-20 DIAGNOSIS — B0221 Postherpetic geniculate ganglionitis: Secondary | ICD-10-CM | POA: Diagnosis not present

## 2021-02-18 ENCOUNTER — Other Ambulatory Visit: Payer: Self-pay | Admitting: Interventional Cardiology

## 2021-02-25 DIAGNOSIS — I10 Essential (primary) hypertension: Secondary | ICD-10-CM | POA: Diagnosis not present

## 2021-02-25 DIAGNOSIS — M8580 Other specified disorders of bone density and structure, unspecified site: Secondary | ICD-10-CM | POA: Diagnosis not present

## 2021-02-25 DIAGNOSIS — E039 Hypothyroidism, unspecified: Secondary | ICD-10-CM | POA: Diagnosis not present

## 2021-02-25 DIAGNOSIS — I5032 Chronic diastolic (congestive) heart failure: Secondary | ICD-10-CM | POA: Diagnosis not present

## 2021-02-25 DIAGNOSIS — I2510 Atherosclerotic heart disease of native coronary artery without angina pectoris: Secondary | ICD-10-CM | POA: Diagnosis not present

## 2021-02-25 DIAGNOSIS — E782 Mixed hyperlipidemia: Secondary | ICD-10-CM | POA: Diagnosis not present

## 2021-02-25 DIAGNOSIS — J4520 Mild intermittent asthma, uncomplicated: Secondary | ICD-10-CM | POA: Diagnosis not present

## 2021-03-05 ENCOUNTER — Other Ambulatory Visit: Payer: Self-pay

## 2021-03-05 ENCOUNTER — Ambulatory Visit: Payer: Medicare HMO | Admitting: Dermatology

## 2021-03-05 DIAGNOSIS — D485 Neoplasm of uncertain behavior of skin: Secondary | ICD-10-CM

## 2021-03-05 DIAGNOSIS — H0011 Chalazion right upper eyelid: Secondary | ICD-10-CM | POA: Diagnosis not present

## 2021-03-05 DIAGNOSIS — Z86007 Personal history of in-situ neoplasm of skin: Secondary | ICD-10-CM

## 2021-03-05 DIAGNOSIS — L570 Actinic keratosis: Secondary | ICD-10-CM

## 2021-03-05 DIAGNOSIS — Z1283 Encounter for screening for malignant neoplasm of skin: Secondary | ICD-10-CM

## 2021-03-05 DIAGNOSIS — L820 Inflamed seborrheic keratosis: Secondary | ICD-10-CM | POA: Diagnosis not present

## 2021-03-05 MED ORDER — IMIQUIMOD 5 % EX CREA: TOPICAL_CREAM | Freq: Every day | CUTANEOUS | 0 refills | Status: DC

## 2021-03-05 NOTE — Patient Instructions (Signed)
MD ophthalmologists.

## 2021-03-11 DIAGNOSIS — E039 Hypothyroidism, unspecified: Secondary | ICD-10-CM | POA: Diagnosis not present

## 2021-03-11 DIAGNOSIS — E782 Mixed hyperlipidemia: Secondary | ICD-10-CM | POA: Diagnosis not present

## 2021-03-11 DIAGNOSIS — E559 Vitamin D deficiency, unspecified: Secondary | ICD-10-CM | POA: Diagnosis not present

## 2021-03-11 DIAGNOSIS — R7301 Impaired fasting glucose: Secondary | ICD-10-CM | POA: Diagnosis not present

## 2021-03-11 DIAGNOSIS — I10 Essential (primary) hypertension: Secondary | ICD-10-CM | POA: Diagnosis not present

## 2021-03-14 DIAGNOSIS — H5213 Myopia, bilateral: Secondary | ICD-10-CM | POA: Diagnosis not present

## 2021-03-14 DIAGNOSIS — H00011 Hordeolum externum right upper eyelid: Secondary | ICD-10-CM | POA: Diagnosis not present

## 2021-03-14 DIAGNOSIS — Z961 Presence of intraocular lens: Secondary | ICD-10-CM | POA: Diagnosis not present

## 2021-03-14 DIAGNOSIS — H04121 Dry eye syndrome of right lacrimal gland: Secondary | ICD-10-CM | POA: Diagnosis not present

## 2021-03-18 DIAGNOSIS — Z23 Encounter for immunization: Secondary | ICD-10-CM | POA: Diagnosis not present

## 2021-03-18 DIAGNOSIS — Z0000 Encounter for general adult medical examination without abnormal findings: Secondary | ICD-10-CM | POA: Diagnosis not present

## 2021-03-18 DIAGNOSIS — I10 Essential (primary) hypertension: Secondary | ICD-10-CM | POA: Diagnosis not present

## 2021-03-18 DIAGNOSIS — Z8719 Personal history of other diseases of the digestive system: Secondary | ICD-10-CM | POA: Diagnosis not present

## 2021-03-18 DIAGNOSIS — E782 Mixed hyperlipidemia: Secondary | ICD-10-CM | POA: Diagnosis not present

## 2021-03-18 DIAGNOSIS — E039 Hypothyroidism, unspecified: Secondary | ICD-10-CM | POA: Diagnosis not present

## 2021-03-18 DIAGNOSIS — R7301 Impaired fasting glucose: Secondary | ICD-10-CM | POA: Diagnosis not present

## 2021-03-20 DIAGNOSIS — I2510 Atherosclerotic heart disease of native coronary artery without angina pectoris: Secondary | ICD-10-CM | POA: Diagnosis not present

## 2021-03-20 DIAGNOSIS — I10 Essential (primary) hypertension: Secondary | ICD-10-CM | POA: Diagnosis not present

## 2021-03-20 DIAGNOSIS — E782 Mixed hyperlipidemia: Secondary | ICD-10-CM | POA: Diagnosis not present

## 2021-03-20 DIAGNOSIS — E039 Hypothyroidism, unspecified: Secondary | ICD-10-CM | POA: Diagnosis not present

## 2021-03-20 DIAGNOSIS — M8580 Other specified disorders of bone density and structure, unspecified site: Secondary | ICD-10-CM | POA: Diagnosis not present

## 2021-03-20 DIAGNOSIS — J4520 Mild intermittent asthma, uncomplicated: Secondary | ICD-10-CM | POA: Diagnosis not present

## 2021-03-20 DIAGNOSIS — I5032 Chronic diastolic (congestive) heart failure: Secondary | ICD-10-CM | POA: Diagnosis not present

## 2021-03-24 ENCOUNTER — Encounter: Payer: Self-pay | Admitting: Dermatology

## 2021-03-24 NOTE — Progress Notes (Signed)
   New Patient   Subjective  [name] is a 75 y.o. female who presents for the following: Skin Problem (Here to check a few lesions. Concerns left arm, scalp and back. Patient says we have removed some of it before. History of cis left wrist ).  General skin examination, new crust and site of skin cancer left wrist plus several other areas to check Location:  Duration:  Quality:  Associated Signs/Symptoms: Modifying Factors:  Severity:  Timing: Context:    The following portions of the chart were reviewed this encounter and updated as appropriate:  Tobacco  Allergies  Meds  Problems  Med Hx  Surg Hx  Fam Hx      Objective  Well appearing patient in no apparent distress; mood and affect are within normal limits. Mid Back Slightly inflamed tan-pink 4 mm textured papule  Mid Back Waist up skin exam.  No atypical pigmented lesions.  1 possible new nonmelanoma skin cancer left cheek will be biopsied.  Left Wrist - Posterior Still  subtle pink crust.  Right Upper Eyelid Subtle 2 mm pink swelling on eyelid margin.  Left Anterior Mandible Crusted waxy 7 mm papule, rule out superficial carcinoma         A full examination was performed including scalp, head, eyes, ears, nose, lips, neck, chest, axillae, abdomen, back, buttocks, bilateral upper extremities, bilateral lower extremities, hands, feet, fingers, toes, fingernails, and toenails. All findings within normal limits unless otherwise noted below.  Areas beneath undergarments not fully examined.   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  Consider rebiopsy if persists  imiquimod (ALDARA) 5 % cream - Left Wrist - Posterior Apply topically at  bedtime.  Chalazion of right upper eyelid Right Upper Eyelid  Encouraged to return to her ophthalmologist.  Neoplasm of uncertain behavior of skin Left Anterior Mandible  Skin / nail biopsy Type of biopsy: tangential   Informed consent: discussed and consent obtained   Timeout: patient name, date of birth, surgical site, and procedure verified   Anesthesia: the lesion was anesthetized in a standard fashion   Anesthetic:  1% lidocaine w/ epinephrine 1-100,000 local infiltration Instrument used: flexible razor blade   Hemostasis achieved with: ferric subsulfate   Outcome: patient tolerated procedure well   Post-procedure details: wound care instructions given    Specimen 1 - Surgical pathology Differential Diagnosis: scc vs bcc  Check Margins: No

## 2021-04-02 ENCOUNTER — Ambulatory Visit (INDEPENDENT_AMBULATORY_CARE_PROVIDER_SITE_OTHER): Payer: Medicare HMO

## 2021-04-02 DIAGNOSIS — I495 Sick sinus syndrome: Secondary | ICD-10-CM

## 2021-04-02 LAB — CUP PACEART REMOTE DEVICE CHECK
Battery Impedance: 401 Ohm
Battery Remaining Longevity: 82 mo
Battery Voltage: 2.77 V
Brady Statistic AP VP Percent: 1 %
Brady Statistic AP VS Percent: 78 %
Brady Statistic AS VP Percent: 0 %
Brady Statistic AS VS Percent: 21 %
Date Time Interrogation Session: 20221101105833
Implantable Lead Implant Date: 19970926
Implantable Lead Implant Date: 19970926
Implantable Lead Location: 753859
Implantable Lead Location: 753860
Implantable Lead Model: 4024
Implantable Lead Model: 4524
Implantable Pulse Generator Implant Date: 20171214
Lead Channel Impedance Value: 322 Ohm
Lead Channel Impedance Value: 947 Ohm
Lead Channel Pacing Threshold Amplitude: 0.625 V
Lead Channel Pacing Threshold Amplitude: 1.5 V
Lead Channel Pacing Threshold Pulse Width: 0.4 ms
Lead Channel Pacing Threshold Pulse Width: 0.4 ms
Lead Channel Setting Pacing Amplitude: 2.5 V
Lead Channel Setting Pacing Amplitude: 2.5 V
Lead Channel Setting Pacing Pulse Width: 0.4 ms
Lead Channel Setting Sensing Sensitivity: 4 mV

## 2021-04-09 NOTE — Progress Notes (Signed)
Remote pacemaker transmission.   

## 2021-04-22 DIAGNOSIS — E782 Mixed hyperlipidemia: Secondary | ICD-10-CM | POA: Diagnosis not present

## 2021-04-22 DIAGNOSIS — M8580 Other specified disorders of bone density and structure, unspecified site: Secondary | ICD-10-CM | POA: Diagnosis not present

## 2021-04-22 DIAGNOSIS — I2510 Atherosclerotic heart disease of native coronary artery without angina pectoris: Secondary | ICD-10-CM | POA: Diagnosis not present

## 2021-04-22 DIAGNOSIS — I5032 Chronic diastolic (congestive) heart failure: Secondary | ICD-10-CM | POA: Diagnosis not present

## 2021-04-22 DIAGNOSIS — I10 Essential (primary) hypertension: Secondary | ICD-10-CM | POA: Diagnosis not present

## 2021-04-22 DIAGNOSIS — E039 Hypothyroidism, unspecified: Secondary | ICD-10-CM | POA: Diagnosis not present

## 2021-04-22 DIAGNOSIS — J4520 Mild intermittent asthma, uncomplicated: Secondary | ICD-10-CM | POA: Diagnosis not present

## 2021-04-29 ENCOUNTER — Encounter: Payer: Self-pay | Admitting: Oncology

## 2021-04-29 DIAGNOSIS — M85851 Other specified disorders of bone density and structure, right thigh: Secondary | ICD-10-CM | POA: Diagnosis not present

## 2021-04-29 DIAGNOSIS — Z1231 Encounter for screening mammogram for malignant neoplasm of breast: Secondary | ICD-10-CM | POA: Diagnosis not present

## 2021-04-29 DIAGNOSIS — Z780 Asymptomatic menopausal state: Secondary | ICD-10-CM | POA: Diagnosis not present

## 2021-05-01 ENCOUNTER — Encounter: Payer: Self-pay | Admitting: Interventional Cardiology

## 2021-05-01 ENCOUNTER — Other Ambulatory Visit: Payer: Self-pay

## 2021-05-01 ENCOUNTER — Ambulatory Visit (INDEPENDENT_AMBULATORY_CARE_PROVIDER_SITE_OTHER): Payer: Medicare HMO | Admitting: Interventional Cardiology

## 2021-05-01 VITALS — BP 130/90 | HR 64 | Ht 61.0 in | Wt 156.0 lb

## 2021-05-01 DIAGNOSIS — I6523 Occlusion and stenosis of bilateral carotid arteries: Secondary | ICD-10-CM

## 2021-05-01 DIAGNOSIS — I2510 Atherosclerotic heart disease of native coronary artery without angina pectoris: Secondary | ICD-10-CM

## 2021-05-01 DIAGNOSIS — I495 Sick sinus syndrome: Secondary | ICD-10-CM | POA: Diagnosis not present

## 2021-05-01 DIAGNOSIS — I5032 Chronic diastolic (congestive) heart failure: Secondary | ICD-10-CM

## 2021-05-01 DIAGNOSIS — E785 Hyperlipidemia, unspecified: Secondary | ICD-10-CM

## 2021-05-01 DIAGNOSIS — I10 Essential (primary) hypertension: Secondary | ICD-10-CM | POA: Diagnosis not present

## 2021-05-01 NOTE — Patient Instructions (Signed)

## 2021-05-01 NOTE — Progress Notes (Signed)
Cardiology Office Note:    Date:  05/01/2021   ID:  [name], DOB [birth year], MRN [id number]  PCP:  Lawerance Cruel, MD  Cardiologist:  [name], MD   Referring MD: [name], MD   Chief Complaint  Patient presents with   Hypertension   Coronary Artery Disease     History of Present Illness:    [name] is a 75 y.o. female with a hx of hyperlipidemia,  family history of CAD, chronic diastolic HF, non obstructive CAD, essential hypertension, sick sinus syndrome and permanent pacemaker, history of breast cancer, aortic atherosclerosis, and bilateral carotid and intracranial vascular calcification without significant obstruction by recent CT 2021.   She is doing okay.  She denies angina.  No significant shortness of breath.  No orthopnea PND.  Physical activity is unlimited.  She denies syncope.  She denies palpitations.  Past Medical History:  Diagnosis Date   Allergic rhinitis    Arthritis    Family history of breast cancer    Family history of colon cancer    Family history of pancreatic cancer    GERD (gastroesophageal reflux disease)    GI bleed    History of basal cell cancer 11/08/2008   RIGHT SUBAURICULAR   History of radiation therapy 08/05/2016 - 09/12/2016   Left Breast 50.4 Gy 28 fractions   Hyperlipidemia    Hypertension    Hypothyroidism    Lumbar disc disease    Malignant neoplasm of upper-outer quadrant of left female breast ([place]) 04/11/2016   Pacemaker    Peptic ulcer disease    SCC (squamous cell carcinoma) 09/09/2017   LEFT WRIST CX 3 5FU   Squamous cell carcinoma of skin 05/30/2013   LEFT LOWER BACK   SSS (sick sinus syndrome) ([place])    Thyroid disease     Past Surgical History:  Procedure Laterality Date   ABDOMINAL HYSTERECTOMY     BREAST LUMPECTOMY WITH RADIOACTIVE SEED AND SENTINEL LYMPH NODE BIOPSY Left 06/06/2016   Procedure: LEFT BREAST LUMPECTOMY WITH RADIOACTIVE SEED AND LEFT AXILLARY SENTINEL LYMPH NODE BIOPSY;   Surgeon: Alphonsa Overall, MD;  Location: [facility];  Service: General;  Laterality: Left;   BREAST SURGERY     left   EP IMPLANTABLE DEVICE N/A 05/15/2016   Procedure: PPM Generator Changeout;  Surgeon: Evans Lance, MD;  Location: [facility];  Service: Cardiovascular;  Laterality: N/A;   HERNIA REPAIR     LEFT HEART CATH AND CORONARY ANGIOGRAPHY N/A 10/09/2017   Procedure: LEFT HEART CATH AND CORONARY ANGIOGRAPHY;  Surgeon: Belva Crome, MD;  Location: [facility];  Service: Cardiovascular;  Laterality: N/A;   PACEMAKER INSERTION      Current Medications: Current Meds  Medication Sig   aspirin EC 81 MG tablet Take 1 tablet (81 mg total) by mouth every Monday, Wednesday, and Friday. Swallow whole.   ezetimibe (ZETIA) 10 MG tablet TAKE ONE TABLET BY MOUTH EVERYDAY AT BEDTIME Needs appointment for further refills   fluticasone (FLONASE ALLERGY RELIEF) 50 MCG/ACT nasal spray 2 spray in each nostril   imiquimod (ALDARA) 5 % cream Apply topically at bedtime.   levothyroxine (SYNTHROID) 88 MCG tablet Take 88 mcg by mouth daily.   MODERNA COVID-19 VACCINE 100 MCG/0.5ML injection    mometasone (NASONEX) 50 MCG/ACT nasal spray Place 2 sprays into the nose daily.   omeprazole (PRILOSEC) 20 MG capsule Take 20 mg by mouth daily.   ondansetron (ZOFRAN) 4  MG tablet Take 1 tablet (4 mg total) by mouth every 8 (eight) hours as needed for nausea or vomiting.   tamoxifen (NOLVADEX) 20 MG tablet TAKE ONE TABLET BY MOUTH EVERY MORNING   tamoxifen (NOLVADEX) 20 MG tablet 1 tablet   triamterene-hydrochlorothiazide (MAXZIDE) 75-50 MG tablet Take 1 tablet by mouth every evening.    valACYclovir (VALTREX) 1000 MG tablet Take 1,000 mg by mouth 3 (three) times daily.     Allergies:   Bee venom, Morphine and related, Nsaids, Wasp venom, Penicillins, and Sulfa antibiotics   Social History   Socioeconomic History   Marital status: Married    Spouse name: Not on file   Number of children: 2   Years  of education: Not on file   Highest education level: Not on file  Occupational History   Occupation: house cleaner  Tobacco Use   Smoking status: Never   Smokeless tobacco: Never  Vaping Use   Vaping Use: Never used  Substance and Sexual Activity   Alcohol use: No   Drug use: No   Sexual activity: Never    Birth control/protection: Abstinence, None  Other Topics Concern   Not on file  Social History Narrative   Not on file   Social Determinants of Health   Financial Resource Strain: Not on file  Food Insecurity: Not on file  Transportation Needs: Not on file  Physical Activity: Not on file  Stress: Not on file  Social Connections: Not on file     Family History: The patient's family history includes Bone cancer in her father; Bradycardia in her cousin; Breast cancer (age of onset: 34) in her cousin and cousin; Breast cancer (age of onset: 63) in her sister; Colon cancer (age of onset: 60) in her cousin; Congestive Heart Failure in her mother; Heart attack in her maternal grandmother and paternal grandmother; Hypertension in her father; Melanoma in her daughter; Pancreatic cancer (age of onset: 5) in her paternal aunt; Rectal cancer (age of onset: 52) in her paternal aunt; Stroke in her paternal grandfather.  ROS:   Please see the history of present illness.    She has intermittent either the left or right arm discomfort.  This discomfort is in the forearm.  It is an ache.  It started spontaneously for unknown reasons.  Not exertional related.  All other systems reviewed and are negative.  EKGs/Labs/Other Studies Reviewed:    The following studies were reviewed today: No new imaging  EKG:  EKG AV sequential pacing.  Heart rate 65.  No change compared to prior.  Recent Labs: No results found for requested labs within last 8760 hours.  Recent Lipid Panel    Component Value Date/Time   CHOL 153 02/28/2020 1116   TRIG 181 (H) 02/28/2020 1116   HDL 48 02/28/2020 1116    CHOLHDL 3.2 02/28/2020 1116   LDLCALC 75 02/28/2020 1116    Physical Exam:    VS:  BP 130/90   Pulse 64   Ht 5\' 1"  (1.549 m)   Wt 156 lb (70.8 kg)   SpO2 96%   BMI 29.48 kg/m     Wt Readings from Last 3 Encounters:  05/01/21 156 lb (70.8 kg)  03/28/20 148 lb (67.1 kg)  01/11/20 159 lb 11.2 oz (72.4 kg)     GEN: Compatible with age. No acute distress HEENT: Normal NECK: No JVD. LYMPHATICS: No lymphadenopathy CARDIAC: No murmur. RRR no gallop, or edema. VASCULAR:  Normal Pulses. No bruits. RESPIRATORY:  Clear  to auscultation without rales, wheezing or rhonchi  ABDOMEN: Soft, non-tender, non-distended, No pulsatile mass, MUSCULOSKELETAL: No deformity  SKIN: Warm and dry NEUROLOGIC:  Alert and oriented x 3 PSYCHIATRIC:  Normal affect   ASSESSMENT:    1. Coronary artery disease involving native coronary artery of native heart without angina pectoris   2. SSS (sick sinus syndrome) ([place])   3. Hyperlipidemia, unspecified hyperlipidemia type   4. Chronic diastolic heart failure ([place])   5. Atherosclerosis of both carotid arteries   6. Sick sinus syndrome ([place])   7. Primary hypertension    PLAN:    In order of problems listed above:  Secondary prevention discussed Normal pacemaker function We will get copies of recent labs done by Dr. Harrington Challenger No evidence of volume overload Secondary prevention discussed EKG is unchanged from 1 year ago. Low-salt diet.  Monitor blood pressure at home.  If consistently above 140/90 additional adjustments will be needed.  She will notify us with some recordings.   Target BP: <130/80 mmHg  Diet and lifestyle measures for BP control were reviewed in detail: Low sodium diet (<2.5 gm daily); alcohol restriction (<3 ounces per day); weight loss (Mediterranean); avoid non-steroidal agents; > 6 hours sleep per day; 150 min moderate exercise per week. Medical regimen will include at least 2 agents. Resistant hypertension if not controlled on 3  agents. Consider further evaluation: Sleep study to r/o OSA; Renal angiogram; Primary hyperaldonism and Pheochromocytoma w/u. After 3 agents, consider MRA (spironolactone)/ Epleronone), hydralazine, beta-blocker, and Minoxidil if not already in use due to patient profile.       Medication Adjustments/Labs and Tests Ordered: Current medicines are reviewed at length with the patient today.  Concerns regarding medicines are outlined above.  Orders Placed This Encounter  Procedures   EKG 12-Lead    No orders of the defined types were placed in this encounter.   Patient Instructions  Medication Instructions:  Your physician recommends that you continue on your current medications as directed. Please refer to the Current Medication list given to you today.  *If you need a refill on your cardiac medications before your next appointment, please call your pharmacy*   Lab Work: None If you have labs (blood work) drawn today and your tests are completely normal, you will receive your results only by: [place] (if you have MyChart) OR A paper copy in the mail If you have any lab test that is abnormal or we need to change your treatment, we will call you to review the results.   Testing/Procedures: None   Follow-Up: At [facility], you and your health needs are our priority.  As part of our continuing mission to provide you with exceptional heart care, we have created designated Provider Care Teams.  These Care Teams include your primary Cardiologist (physician) and Advanced Practice Providers (APPs -  Physician Assistants and Nurse Practitioners) who all work together to provide you with the care you need, when you need it.  We recommend signing up for the patient portal called "MyChart".  Sign up information is provided on this After Visit Summary.  MyChart is used to connect with patients for Virtual Visits (Telemedicine).  Patients are able to view lab/test results, encounter  notes, upcoming appointments, etc.  Non-urgent messages can be sent to your provider as well.   To learn more about what you can do with MyChart, go to [email or web address].    Your next appointment:   1 year(s)  The format for your next  appointment:   In Person  Provider:   [name], MD     Other Instructions     Signed, [name], MD  05/01/2021 4:39 PM    [place]

## 2021-05-13 ENCOUNTER — Other Ambulatory Visit: Payer: Self-pay | Admitting: *Deleted

## 2021-05-13 DIAGNOSIS — C50412 Malignant neoplasm of upper-outer quadrant of left female breast: Secondary | ICD-10-CM

## 2021-05-13 NOTE — Progress Notes (Signed)
[facility]  [phone] [phone]     ID: [name] DOB: September 12, 1945  MR#: [id number]  EXH#:[id number]  Patient Care Team: Lawerance Cruel, MD as PCP - General (Family Medicine) Belva Crome, MD as PCP - Cardiology (Cardiology) Bader Stubblefield, Virgie Dad, MD as Consulting Physician (Oncology) Gery Pray, MD as Consulting Physician (Radiation Oncology) Alphonsa Overall, MD as Consulting Physician (General Surgery) Teena Irani, MD (Inactive) as Consulting Physician (Gastroenterology) Delice Bison Charlestine Massed, NP as Nurse Practitioner (Hematology and Oncology) Deboraha Sprang, MD as Consulting Physician (Cardiology) Lavonna Monarch, MD as Consulting Physician (Dermatology) OTHER MD:   CHIEF COMPLAINT: HER-2 positive, 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   She continues on tamoxifen with good tolerance.  She takes this for prophylaxis (to decrease the risk of a new breast cancer developing).  She has tolerated this remarkably well but is still happy to be going off it at this point.  Since her last visit, she underwent bilateral diagnostic mammography with tomography at [facility] on 04/29/2021 showing: breast density category A; no evidence of malignancy in either breast.   She also underwent bone density the same day showing a T score of -1.3.  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  She is also primary caregiver to her husband who has significant cirrhosis apparently secondary to chemical exposure at work.  A detailed review of systems today was otherwise stable.   COVID 19 VACCINATION STATUS: Pfizer x4.  Also had COVID in 2020.  She tells me her sense of taste and smell never recovered.    BREAST CANCER HISTORY: From the original  intake note:   [name] had bilateral screening mammography at [facility] 03/28/2016. This found the breast density to be category A. In the left breast at the 12:00 position there was a new oval lesion. There were no other findings of concern. On 04/04/2016 the patient underwent left ultrasonography this showed a possible hypoechoic lesion in the upper outer quadrant of the left breast, measuring approximately 0.5 cm. The left axilla was mammographically benign.  Biopsy of the left breast area in question 04/09/2016 showed ([id number]) invasive ductal carcinoma, grade 1 or 2, estrogen and progesterone receptor negative, with an MIB-1 of 10%, but HER-2 amplified, the signals ratio being 7.36 and the number per cell 9.20.  Her subsequent history is as detailed below.   PAST MEDICAL HISTORY: Past Medical History:  Diagnosis Date   Allergic rhinitis    Arthritis    Family history of breast cancer    Family history of colon cancer    Family history of pancreatic cancer    GERD (gastroesophageal reflux disease)    GI bleed    History of basal cell cancer 11/08/2008   RIGHT SUBAURICULAR   History of radiation therapy 08/05/2016 - 09/12/2016   Left Breast 50.4 Gy 28 fractions   Hyperlipidemia    Hypertension    Hypothyroidism    Lumbar disc disease    Malignant neoplasm of upper-outer quadrant of left female breast ([place]) 04/11/2016   Pacemaker    Peptic ulcer disease    SCC (squamous cell carcinoma) 09/09/2017   LEFT WRIST CX 3 5FU   Squamous cell carcinoma of skin 05/30/2013   LEFT LOWER BACK   SSS (sick sinus syndrome) (  [place])    Thyroid disease     PAST SURGICAL HISTORY: Past Surgical History:  Procedure Laterality Date   ABDOMINAL HYSTERECTOMY     BREAST LUMPECTOMY WITH RADIOACTIVE SEED AND SENTINEL LYMPH NODE BIOPSY Left 06/06/2016   Procedure: LEFT BREAST LUMPECTOMY WITH RADIOACTIVE SEED AND LEFT AXILLARY SENTINEL LYMPH NODE BIOPSY;  Surgeon: Alphonsa Overall, MD;  Location: [place];   Service: General;  Laterality: Left;   BREAST SURGERY     left   EP IMPLANTABLE DEVICE N/A 05/15/2016   Procedure: PPM Generator Changeout;  Surgeon: Evans Lance, MD;  Location: [place] [facility];  Service: Cardiovascular;  Laterality: N/A;   HERNIA REPAIR     LEFT HEART CATH AND CORONARY ANGIOGRAPHY N/A 10/09/2017   Procedure: LEFT HEART CATH AND CORONARY ANGIOGRAPHY;  Surgeon: Belva Crome, MD;  Location: [place] [facility];  Service: Cardiovascular;  Laterality: N/A;   PACEMAKER INSERTION      FAMILY HISTORY Family History  Problem Relation Age of Onset   Congestive Heart Failure Mother    Hypertension Father    Bone cancer Father    Breast cancer Sister 40   Bradycardia Cousin    Breast cancer Cousin 63   Breast cancer Cousin 12   Colon cancer Cousin 49   Pancreatic cancer Paternal Aunt 76   Rectal cancer Paternal Aunt 38   Heart attack Maternal Grandmother    Heart attack Paternal Grandmother    Stroke Paternal Grandfather    Melanoma Daughter        dx in her 34s  The patient's father died at the age of 32 with myeloma. The patient's mother died at the age of 60 from heart disease the patient has a sister diagnosed with breast cancer at age 22, a paternal cousin diagnosed with breast cancer at age 71, and a maternal cousin diagnosed with breast cancer. There is also a history of pancreatic, rectal, and: colon cancers all on the father's side of the family   GYNECOLOGIC HISTORY:  No LMP recorded. Patient has had a hysterectomy. Menarche age 53, first live birth age 76, the patient is [name] P2. She stopped having periods in 1981. She did not take hormone replacement. She used oral contraceptives remotely with no complications   SOCIAL HISTORY: (updated 01/2019) She has worked as a Pharmacist, hospital substituted, in Scientist, research (medical), and more recently as a Engineer, building services. Her husband [name]") [name] has been under the care of hospice and is stable. The patient's daughter [name] lives  in [place] where she works as a Pharmacist, hospital. Her daughter [name] lives in [place]. She has 13 grandchildren and 4 great-grandchildren. 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     ADVANCED DIRECTIVES: The patient's daughter, [name], is her healthcare power of attorney. She can be reached at [phone]   HEALTH MAINTENANCE: Social History   Tobacco Use   Smoking status: Never   Smokeless tobacco: Never  Vaping Use   Vaping Use: Never used  Substance Use Topics   Alcohol use: No   Drug use: No     Colonoscopy: 2007/ [facility]  PAP:  Bone density: 03/28/2016 at [place], T score of -1.3   Allergies  Allergen Reactions   Bee Venom Shortness Of Breath and Itching   Morphine And Related Shortness Of Breath and Itching   Nsaids Other (See Comments)    Upper GI bleed   Wasp Venom Shortness Of Breath and Itching   Penicillins Itching and Other (  See Comments)    Has patient had a PCN reaction causing immediate rash, facial/tongue/throat swelling, SOB or lightheadedness with hypotension:No Has patient had a PCN reaction causing severe rash involving mucus membranes or skin necrosis:No Has patient had a PCN reaction that required hospitalization:No Has patient had a PCN reaction occurring within the last 10 years:No If all of the above answers are "NO", then may proceed with Cephalosporin use.    Sulfa Antibiotics Itching    Current Outpatient Medications  Medication Sig Dispense Refill   albuterol (VENTOLIN HFA) 108 (90 Base) MCG/ACT inhaler INHALE 2 PUFFS BY MOUTH INTO THE LUNGS EVERY 4 (FOUR) HOURS AS NEEDED FOR WHEEZING OR SHORTNESS OF BREATH. (Patient not taking: Reported on 05/01/2021) 18 g 0   aspirin EC 81 MG tablet Take 1 tablet (81 mg total) by mouth every Monday, Wednesday, and Friday. Swallow whole. 45 tablet 3   atorvastatin (LIPITOR) 80 MG tablet Take 1 tablet (80 mg total) by mouth daily. 90 tablet 3   ezetimibe (ZETIA) 10 MG tablet TAKE ONE TABLET BY  MOUTH EVERYDAY AT BEDTIME Needs appointment for further refills 90 tablet 0   fluticasone (FLONASE ALLERGY RELIEF) 50 MCG/ACT nasal spray 2 spray in each nostril     imiquimod (ALDARA) 5 % cream Apply topically at bedtime. 12 each 0   levothyroxine (SYNTHROID) 75 MCG tablet Take 75 mcg by mouth daily. (Patient not taking: Reported on 05/01/2021)     levothyroxine (SYNTHROID) 88 MCG tablet Take 88 mcg by mouth daily.     MODERNA COVID-19 VACCINE 100 MCG/0.5ML injection      mometasone (NASONEX) 50 MCG/ACT nasal spray Place 2 sprays into the nose daily.     Omega-3 Fatty Acids (FISH OIL) 1000 MG CAPS Take 1,000 mg by mouth daily. (Patient not taking: Reported on 05/01/2021)     omeprazole (PRILOSEC) 20 MG capsule Take 20 mg by mouth daily.     omeprazole (PRILOSEC) 40 MG capsule Take 40 mg by mouth daily. (Patient not taking: Reported on 05/01/2021)     ondansetron (ZOFRAN) 4 MG tablet Take 1 tablet (4 mg total) by mouth every 8 (eight) hours as needed for nausea or vomiting. 15 tablet 0   tamoxifen (NOLVADEX) 20 MG tablet TAKE ONE TABLET BY MOUTH EVERY MORNING 90 tablet 1   triamterene-hydrochlorothiazide (MAXZIDE) 75-50 MG tablet Take 1 tablet by mouth every evening.      valACYclovir (VALTREX) 1000 MG tablet Take 1,000 mg by mouth 3 (three) times daily.     No current facility-administered medications for this visit.    OBJECTIVE:  white woman in no acute distress  Vitals:   05/14/21 1140  BP: (!) 148/79  Pulse: 75  Resp: 16  Temp: (!) 97.5 F (36.4 C)  SpO2: 98%      Body mass index is 29.48 kg/m.    ECOG FS:0 - Asymptomatic  Sclerae unicteric, EOMs intact Wearing a mask No cervical or supraclavicular adenopathy Lungs no rales or rhonchi Heart regular rate and rhythm Abd soft, nontender, positive bowel sounds MSK no focal spinal tenderness, no upper extremity lymphedema Neuro: nonfocal, well oriented, appropriate affect Breasts: The right breast is unremarkable.  The left  breast is status postlumpectomy and radiation with no evidence of local recurrence.  Both axilla are benign.   LAB RESULTS:  CMP     Component Value Date/Time   NA 142 01/11/2020 0908   NA 140 12/23/2019 0000   NA 142 04/16/2016 0822   K 3.4 (  L) 01/11/2020 0908   K 3.8 04/16/2016 0822   CL 109 01/11/2020 0908   CO2 24 01/11/2020 0908   CO2 23 04/16/2016 0822   GLUCOSE 105 (H) 01/11/2020 0908   GLUCOSE 102 04/16/2016 0822   BUN 17 01/11/2020 0908   BUN 16 12/23/2019 0000   BUN 25.3 04/16/2016 0822   CREATININE 0.77 01/11/2020 0908   CREATININE 0.97 (H) 05/01/2016 1627   CREATININE 0.8 04/16/2016 0822   CALCIUM 9.6 01/11/2020 0908   CALCIUM 9.7 04/16/2016 0822   PROT 6.7 02/28/2020 1116   PROT 6.9 04/16/2016 0822   ALBUMIN 4.4 02/28/2020 1116   ALBUMIN 3.8 04/16/2016 0822   AST 24 02/28/2020 1116   AST 25 01/11/2020 0908   AST 20 04/16/2016 0822   ALT 16 02/28/2020 1116   ALT 21 01/11/2020 0908   ALT 18 04/16/2016 0822   ALKPHOS 82 02/28/2020 1116   ALKPHOS 68 04/16/2016 0822   BILITOT 0.9 02/28/2020 1116   BILITOT 0.8 01/11/2020 0908   BILITOT 0.60 04/16/2016 0822   GFRNONAA >60 01/11/2020 0908   GFRAA >60 01/11/2020 0908    INo results found for: SPEP, UPEP  Lab Results  Component Value Date   WBC 5.2 05/14/2021   NEUTROABS 3.7 05/14/2021   HGB 13.4 05/14/2021   HCT 39.0 05/14/2021   MCV 85.7 05/14/2021   PLT 276 05/14/2021      Chemistry      Component Value Date/Time   NA 142 01/11/2020 0908   NA 140 12/23/2019 0000   NA 142 04/16/2016 0822   K 3.4 (L) 01/11/2020 0908   K 3.8 04/16/2016 0822   CL 109 01/11/2020 0908   CO2 24 01/11/2020 0908   CO2 23 04/16/2016 0822   BUN 17 01/11/2020 0908   BUN 16 12/23/2019 0000   BUN 25.3 04/16/2016 0822   CREATININE 0.77 01/11/2020 0908   CREATININE 0.97 (H) 05/01/2016 1627   CREATININE 0.8 04/16/2016 0822      Component Value Date/Time   CALCIUM 9.6 01/11/2020 0908   CALCIUM 9.7 04/16/2016 0822    ALKPHOS 82 02/28/2020 1116   ALKPHOS 68 04/16/2016 0822   AST 24 02/28/2020 1116   AST 25 01/11/2020 0908   AST 20 04/16/2016 0822   ALT 16 02/28/2020 1116   ALT 21 01/11/2020 0908   ALT 18 04/16/2016 0822   BILITOT 0.9 02/28/2020 1116   BILITOT 0.8 01/11/2020 0908   BILITOT 0.60 04/16/2016 0822       No results found for: LABCA2  No components found for: LABCA125  No results for input(s): INR in the last 168 hours.  Urinalysis No results found for: COLORURINE, APPEARANCEUR, LABSPEC, PHURINE, GLUCOSEU, HGBUR, BILIRUBINUR, KETONESUR, PROTEINUR, UROBILINOGEN, NITRITE, LEUKOCYTESUR   STUDIES: No results found.   ELIGIBLE FOR AVAILABLE RESEARCH PROTOCOL: no  ASSESSMENT: 75 y.o. [place] woman status post left breast upper outer quadrant biopsy 04/09/2016 for a clinical T1a N0, stage IA invasive ductal carcinoma, grade 1 or 2, estrogen and progesterone receptor negative, with an MIB-1 of 10%, but HER-2 amplified  (1) left lumpectomy and sentinel lymph node samplng 06/06/2016 showed a pT1a pN0, stage IA  Invasive ductal carcinoma , grade 2 , with negatve margins  (2) No consideration of anti-HER-2 immunotherapy/chemotherapy given final tumor size  (3) adjuvant radiation 08/05/16-09/12/16  Site/dose:   Left breast/ 50.4 Gy in 28 fractions  (a) the patient's pacemaker has been moved to the contralateral side to facilitate treatment   (4)  Genetics  testing through the Comprehensive Cancer Panel offered by [facility]  found no deleterious mutations in APC, ATM, AXIN2, BARD1, BMPR1A, BRCA1, BRCA2, BRIP1, CDH1, CDK4, CDKN2A, CHEK2, EPCAM, FANCC, MLH1, MSH2, MSH6, MUTYH, NBN, PALB2, PMS2, POLD1, POLE, PTEN, RAD51C, RAD51D, SCG5/GREM1, SMAD4, STK11, TP53, VHL, and XRCC2.     (5) started tamoxifen 12/13/2016 for prophylaxis, completing treatment December 2022  (a) bone density 08/07/2003 showed a T score of -1.0.  (b) bone density at [facility] 03/28/2016 showed a T score of -1.3  (c) bone  density at [facility] 04/29/2021 shows a T score of -1.3   PLAN: [name] is now just about 5 years out from definitive surgery for her breast cancer with no evidence of disease recurrence.  This is very favorable.  She is completing her tamoxifen this month.  She has taken it for prophylaxis.  She has tolerated it well.  At this point I feel comfortable releasing her from follow-up here.  All she will need in terms of breast cancer follow-up is her yearly mammography and a yearly physician breast exam  We will be glad to see her again at any point in the future if and when the need arises but as of now are making no further routine appointments for her here.  Total encounter time 20 minutes.*   [name], Virgie Dad, MD  05/14/21 11:43 AM Medical Oncology and Hematology [facility] [place], [place] [address] Tel. [phone]    Fax. [phone]   I, Wilburn Mylar, am acting as scribe for Dr. Virgie Dad. [name].  I, Lurline Del MD, have reviewed the above documentation for accuracy and completeness, and I agree with the above.   *Total Encounter Time as defined by the Centers for Medicare and Medicaid Services includes, in addition to the face-to-face time of a patient visit (documented in the note above) non-face-to-face time: obtaining and reviewing outside history, ordering and reviewing medications, tests or procedures, care coordination (communications with other health care professionals or caregivers) and documentation in the medical record.

## 2021-05-14 ENCOUNTER — Inpatient Hospital Stay: Payer: Medicare HMO

## 2021-05-14 ENCOUNTER — Inpatient Hospital Stay: Payer: Medicare HMO | Attending: Oncology | Admitting: Oncology

## 2021-05-14 ENCOUNTER — Other Ambulatory Visit: Payer: Self-pay

## 2021-05-14 VITALS — BP 148/79 | HR 75 | Temp 97.5°F | Resp 16 | Ht 61.0 in | Wt 156.0 lb

## 2021-05-14 DIAGNOSIS — Z923 Personal history of irradiation: Secondary | ICD-10-CM | POA: Insufficient documentation

## 2021-05-14 DIAGNOSIS — Z79810 Long term (current) use of selective estrogen receptor modulators (SERMs): Secondary | ICD-10-CM | POA: Insufficient documentation

## 2021-05-14 DIAGNOSIS — Z880 Allergy status to penicillin: Secondary | ICD-10-CM | POA: Insufficient documentation

## 2021-05-14 DIAGNOSIS — Z885 Allergy status to narcotic agent status: Secondary | ICD-10-CM | POA: Diagnosis not present

## 2021-05-14 DIAGNOSIS — Z8249 Family history of ischemic heart disease and other diseases of the circulatory system: Secondary | ICD-10-CM | POA: Insufficient documentation

## 2021-05-14 DIAGNOSIS — C50412 Malignant neoplasm of upper-outer quadrant of left female breast: Secondary | ICD-10-CM | POA: Diagnosis not present

## 2021-05-14 DIAGNOSIS — Z171 Estrogen receptor negative status [ER-]: Secondary | ICD-10-CM | POA: Insufficient documentation

## 2021-05-14 DIAGNOSIS — Z85828 Personal history of other malignant neoplasm of skin: Secondary | ICD-10-CM | POA: Insufficient documentation

## 2021-05-14 DIAGNOSIS — Z882 Allergy status to sulfonamides status: Secondary | ICD-10-CM | POA: Insufficient documentation

## 2021-05-14 DIAGNOSIS — Z823 Family history of stroke: Secondary | ICD-10-CM | POA: Diagnosis not present

## 2021-05-14 DIAGNOSIS — Z91030 Bee allergy status: Secondary | ICD-10-CM | POA: Diagnosis not present

## 2021-05-14 DIAGNOSIS — Z808 Family history of malignant neoplasm of other organs or systems: Secondary | ICD-10-CM | POA: Insufficient documentation

## 2021-05-14 DIAGNOSIS — E785 Hyperlipidemia, unspecified: Secondary | ICD-10-CM | POA: Diagnosis not present

## 2021-05-14 DIAGNOSIS — Z803 Family history of malignant neoplasm of breast: Secondary | ICD-10-CM | POA: Insufficient documentation

## 2021-05-14 DIAGNOSIS — Z886 Allergy status to analgesic agent status: Secondary | ICD-10-CM | POA: Diagnosis not present

## 2021-05-14 DIAGNOSIS — Z800 Family history of malignant neoplasm of digestive organs: Secondary | ICD-10-CM | POA: Diagnosis not present

## 2021-05-14 DIAGNOSIS — Z79899 Other long term (current) drug therapy: Secondary | ICD-10-CM | POA: Diagnosis not present

## 2021-05-14 LAB — CMP (CANCER CENTER ONLY)
ALT: 14 U/L (ref 0–44)
AST: 18 U/L (ref 15–41)
Albumin: 3.9 g/dL (ref 3.5–5.0)
Alkaline Phosphatase: 66 U/L (ref 38–126)
Anion gap: 7 (ref 5–15)
BUN: 16 mg/dL (ref 8–23)
CO2: 25 mmol/L (ref 22–32)
Calcium: 9 mg/dL (ref 8.9–10.3)
Chloride: 108 mmol/L (ref 98–111)
Creatinine: 0.78 mg/dL (ref 0.44–1.00)
GFR, Estimated: >60 mL/min (ref >60)
Glucose, Bld: 90 mg/dL (ref 70–99)
Potassium: 3.7 mmol/L (ref 3.5–5.1)
Sodium: 140 mmol/L (ref 135–145)
Total Bilirubin: 0.9 mg/dL (ref 0.3–1.2)
Total Protein: 6.6 g/dL (ref 6.5–8.1)

## 2021-05-14 LAB — CBC WITH DIFFERENTIAL (CANCER CENTER ONLY)
Abs Immature Granulocytes: 0.01 10*3/uL (ref 0.00–0.07)
Basophils Absolute: 0 10*3/uL (ref 0.0–0.1)
Basophils Relative: 1 %
Eosinophils Absolute: 0.2 10*3/uL (ref 0.0–0.5)
Eosinophils Relative: 3 %
HCT: 39 % (ref 36.0–46.0)
Hemoglobin: 13.4 g/dL (ref 12.0–15.0)
Immature Granulocytes: 0 %
Lymphocytes Relative: 18 %
Lymphs Abs: 0.9 10*3/uL (ref 0.7–4.0)
MCH: 29.5 pg (ref 26.0–34.0)
MCHC: 34.4 g/dL (ref 30.0–36.0)
MCV: 85.7 fL (ref 80.0–100.0)
Monocytes Absolute: 0.4 10*3/uL (ref 0.1–1.0)
Monocytes Relative: 8 %
Neutro Abs: 3.7 10*3/uL (ref 1.7–7.7)
Neutrophils Relative %: 70 %
Platelet Count: 276 10*3/uL (ref 150–400)
RBC: 4.55 MIL/uL (ref 3.87–5.11)
RDW: 12.5 % (ref 11.5–15.5)
WBC Count: 5.2 10*3/uL (ref 4.0–10.5)
nRBC: 0 % (ref 0.0–0.2)

## 2021-05-20 ENCOUNTER — Other Ambulatory Visit: Payer: Self-pay | Admitting: Interventional Cardiology

## 2021-05-20 ENCOUNTER — Other Ambulatory Visit: Payer: Self-pay | Admitting: Oncology

## 2021-05-21 DIAGNOSIS — I10 Essential (primary) hypertension: Secondary | ICD-10-CM | POA: Diagnosis not present

## 2021-05-21 DIAGNOSIS — M8580 Other specified disorders of bone density and structure, unspecified site: Secondary | ICD-10-CM | POA: Diagnosis not present

## 2021-05-21 DIAGNOSIS — E782 Mixed hyperlipidemia: Secondary | ICD-10-CM | POA: Diagnosis not present

## 2021-05-21 DIAGNOSIS — I5032 Chronic diastolic (congestive) heart failure: Secondary | ICD-10-CM | POA: Diagnosis not present

## 2021-05-21 DIAGNOSIS — I2510 Atherosclerotic heart disease of native coronary artery without angina pectoris: Secondary | ICD-10-CM | POA: Diagnosis not present

## 2021-05-21 DIAGNOSIS — E039 Hypothyroidism, unspecified: Secondary | ICD-10-CM | POA: Diagnosis not present

## 2021-05-21 DIAGNOSIS — J4520 Mild intermittent asthma, uncomplicated: Secondary | ICD-10-CM | POA: Diagnosis not present

## 2021-05-23 ENCOUNTER — Other Ambulatory Visit: Payer: Self-pay | Admitting: Oncology

## 2021-06-07 ENCOUNTER — Other Ambulatory Visit: Payer: Self-pay

## 2021-06-11 ENCOUNTER — Encounter: Payer: Self-pay | Admitting: Internal Medicine

## 2021-06-11 ENCOUNTER — Other Ambulatory Visit: Payer: Self-pay

## 2021-06-11 ENCOUNTER — Ambulatory Visit (INDEPENDENT_AMBULATORY_CARE_PROVIDER_SITE_OTHER): Payer: Medicare HMO | Admitting: Internal Medicine

## 2021-06-11 VITALS — BP 124/62 | HR 66 | Ht 61.0 in | Wt 156.2 lb

## 2021-06-11 DIAGNOSIS — I495 Sick sinus syndrome: Secondary | ICD-10-CM | POA: Diagnosis not present

## 2021-06-11 DIAGNOSIS — Z950 Presence of cardiac pacemaker: Secondary | ICD-10-CM

## 2021-06-11 NOTE — Patient Instructions (Addendum)
Medication Instructions:  Your physician recommends that you continue on your current medications as directed. Please refer to the Current Medication list given to you today.  *If you need a refill on your cardiac medications before your next appointment, please call your pharmacy*   Lab Work: None ordered   Testing/Procedures: None ordered   Follow-Up: At [facility], you and your health needs are our priority.  As part of our continuing mission to provide you with exceptional heart care, we have created designated Provider Care Teams.  These Care Teams include your primary Cardiologist (physician) and Advanced Practice Providers (APPs -  Physician Assistants and Nurse Practitioners) who all work together to provide you with the care you need, when you need it.   Remote monitoring is used to monitor your Pacemaker or ICD from home. This monitoring reduces the number of office visits required to check your device to one time per year. It allows [place] to keep an eye on the functioning of your device to ensure it is working properly. You are scheduled for a device check from home on 07/02/2021. You may send your transmission at any time that day. If you have a wireless device, the transmission will be sent automatically. After your physician reviews your transmission, you will receive a postcard with your next transmission date.  Your next appointment:   1 year(s)  The format for your next appointment:   In Person  Provider:   Dr. Caryl Comes   Thank you for choosing [facility]!!   [phone]

## 2021-06-11 NOTE — Progress Notes (Signed)
Patient Care Team: Lawerance Cruel, MD as PCP - General (Family Medicine) Belva Crome, MD as PCP - Cardiology (Cardiology) Magrinat, Virgie Dad, MD as Consulting Physician (Oncology) Gery Pray, MD as Consulting Physician (Radiation Oncology) Alphonsa Overall, MD as Consulting Physician (General Surgery) Teena Irani, MD (Inactive) as Consulting Physician (Gastroenterology) Delice Bison Charlestine Massed, NP as Nurse Practitioner (Hematology and Oncology) Deboraha Sprang, MD as Consulting Physician (Cardiology) Lavonna Monarch, MD as Consulting Physician (Dermatology)   HPI  [name] is a 76 y.o. female Last seen in follow-up for a pacemaker implanted previously on the left side and then moved to the right side to get out of the way of radiation therapy. Device implant originally 1998, gen change 2008 and 12/17    DATE TEST EF   8/16 Myoview    No ischemia  4/19 Echo   65 %   5/19 LHC 55-60% LM 30-40; PDA 60-70d      Date Cr K Hgb LDL  8/19 0.69 4.5  87  9/21    75   12/22 0.78 3.7 13.4        Today,  The patient denies chest pain, shortness of breath, nocturnal dyspnea, orthopnea or peripheral edema.  There have been no palpitations, lightheadedness or syncope.    Her husband has cirrhosis and progressive liver failure.  This is attributed to chemical exposures.   Past Medical History:  Diagnosis Date   Allergic rhinitis    Arthritis    Family history of breast cancer    Family history of colon cancer    Family history of pancreatic cancer    GERD (gastroesophageal reflux disease)    GI bleed    History of basal cell cancer 11/08/2008   RIGHT SUBAURICULAR   History of radiation therapy 08/05/2016 - 09/12/2016   Left Breast 50.4 Gy 28 fractions   Hyperlipidemia    Hypertension    Hypothyroidism    Lumbar disc disease    Malignant neoplasm of upper-outer quadrant of left female breast ([place]) 04/11/2016   Pacemaker    Peptic ulcer disease    SCC  (squamous cell carcinoma) 09/09/2017   LEFT WRIST CX 3 5FU   Squamous cell carcinoma of skin 05/30/2013   LEFT LOWER BACK   SSS (sick sinus syndrome) ([place])    Thyroid disease     Past Surgical History:  Procedure Laterality Date   ABDOMINAL HYSTERECTOMY     BREAST LUMPECTOMY WITH RADIOACTIVE SEED AND SENTINEL LYMPH NODE BIOPSY Left 06/06/2016   Procedure: LEFT BREAST LUMPECTOMY WITH RADIOACTIVE SEED AND LEFT AXILLARY SENTINEL LYMPH NODE BIOPSY;  Surgeon: Alphonsa Overall, MD;  Location: [place];  Service: General;  Laterality: Left;   BREAST SURGERY     left   EP IMPLANTABLE DEVICE N/A 05/15/2016   Procedure: PPM Generator Changeout;  Surgeon: Evans Lance, MD;  Location: [place] [facility];  Service: Cardiovascular;  Laterality: N/A;   HERNIA REPAIR     LEFT HEART CATH AND CORONARY ANGIOGRAPHY N/A 10/09/2017   Procedure: LEFT HEART CATH AND CORONARY ANGIOGRAPHY;  Surgeon: Belva Crome, MD;  Location: [place] [facility];  Service: Cardiovascular;  Laterality: N/A;   PACEMAKER INSERTION      Current Outpatient Medications  Medication Sig Dispense Refill   aspirin EC 81 MG tablet Take 1 tablet (81 mg total) by mouth every Monday, Wednesday, and Friday. Swallow whole. 45 tablet 3   atorvastatin (LIPITOR) 80 MG tablet Take  1 tablet (80 mg total) by mouth daily. 90 tablet 3   ezetimibe (ZETIA) 10 MG tablet TAKE ONE TABLET BY MOUTH EVERYDAY AT BEDTIME 90 tablet 3   fluticasone (FLONASE) 50 MCG/ACT nasal spray 2 spray in each nostril     imiquimod (ALDARA) 5 % cream Apply topically at bedtime. 12 each 0   MODERNA COVID-19 VACCINE 100 MCG/0.5ML injection      Omega-3 Fatty Acids (FISH OIL) 1000 MG CAPS Take 1,000 mg by mouth daily.     omeprazole (PRILOSEC) 40 MG capsule Take 40 mg by mouth daily.     ondansetron (ZOFRAN) 4 MG tablet Take 1 tablet (4 mg total) by mouth every 8 (eight) hours as needed for nausea or vomiting. 15 tablet 0   triamterene-hydrochlorothiazide (MAXZIDE) 75-50 MG tablet  Take 1 tablet by mouth every evening.      levothyroxine (SYNTHROID) 75 MCG tablet Take 75 mcg by mouth daily.     tamoxifen (NOLVADEX) 20 MG tablet TAKE ONE TABLET BY MOUTH EVERY MORNING (Patient not taking: Reported on 06/11/2021) 90 tablet 1   valACYclovir (VALTREX) 1000 MG tablet Take 1,000 mg by mouth 3 (three) times daily. (Patient not taking: Reported on 06/11/2021)     No current facility-administered medications for this visit.    Allergies  Allergen Reactions   Bee Venom Shortness Of Breath and Itching   Morphine And Related Shortness Of Breath and Itching   Nsaids Other (See Comments)    Upper GI bleed   Wasp Venom Shortness Of Breath and Itching   Penicillins Itching and Other (See Comments)    Has patient had a PCN reaction causing immediate rash, facial/tongue/throat swelling, SOB or lightheadedness with hypotension:No Has patient had a PCN reaction causing severe rash involving mucus membranes or skin necrosis:No Has patient had a PCN reaction that required hospitalization:No Has patient had a PCN reaction occurring within the last 10 years:No If all of the above answers are "NO", then may proceed with Cephalosporin use.    Sulfa Antibiotics Itching    Review of Systems negative except from HPI and PMH Please see the history of present illness. (+)   All other systems are reviewed and negative.    Physical Exam BP 124/62    Pulse 66    Ht 5\' 1"  (1.549 m)    Wt 156 lb 3.2 oz (70.9 kg)    SpO2 99%    BMI 29.51 kg/m  Well developed and well nourished in no acute distress HENT normal Neck supple with JVP-flat Clear Device pocket well healed; without hematoma or erythema.  There is no tethering  Regular rate and rhythm, no  murmur Abd-soft with active BS No Clubbing cyanosis  edema Skin-warm and dry A & Oriented  Grossly normal sensory and motor function  ECG 05/01/2021 demonstrates atrial pacing at 64 with intrinsic conduction and left bundle branch  block. Intervals 19/15/49     Assessment and  Plan  Sinus node dysfunction  Left bundle branch block  Breast Cancer   Pacemaker [employer]  L sided translocated to R for  XRT   Overall doing exceptionally well.  The big issues for her related to the care of her husband and the burden that this is.  I suggested that she discuss this with her primary care physician and whether she and her husband are eligible for palliative care  F/U in 1 yr  I, Virl Axe, MD, have reviewed all documentation for this visit. The documentation on 06/11/21  for the exam, diagnosis, procedures, and orders are all accurate and complete.

## 2021-07-02 ENCOUNTER — Ambulatory Visit (INDEPENDENT_AMBULATORY_CARE_PROVIDER_SITE_OTHER): Payer: Medicare HMO

## 2021-07-02 DIAGNOSIS — I495 Sick sinus syndrome: Secondary | ICD-10-CM

## 2021-07-03 ENCOUNTER — Ambulatory Visit: Payer: Medicare HMO | Admitting: Interventional Cardiology

## 2021-07-04 LAB — CUP PACEART REMOTE DEVICE CHECK
Battery Impedance: 451 Ohm
Battery Remaining Longevity: 68 mo
Battery Voltage: 2.77 V
Brady Statistic AP VP Percent: 1 %
Brady Statistic AP VS Percent: 86 %
Brady Statistic AS VP Percent: 0 %
Brady Statistic AS VS Percent: 13 %
Date Time Interrogation Session: 20230201183316
Implantable Lead Implant Date: 19970926
Implantable Lead Implant Date: 19970926
Implantable Lead Location: 753859
Implantable Lead Location: 753860
Implantable Lead Model: 4024
Implantable Lead Model: 4524
Implantable Pulse Generator Implant Date: 20171214
Lead Channel Impedance Value: 325 Ohm
Lead Channel Impedance Value: 938 Ohm
Lead Channel Pacing Threshold Amplitude: 0.875 V
Lead Channel Pacing Threshold Amplitude: 1.75 V
Lead Channel Pacing Threshold Pulse Width: 0.4 ms
Lead Channel Pacing Threshold Pulse Width: 0.4 ms
Lead Channel Setting Pacing Amplitude: 2.5 V
Lead Channel Setting Pacing Amplitude: 2.5 V
Lead Channel Setting Pacing Pulse Width: 0.4 ms
Lead Channel Setting Sensing Sensitivity: 4 mV

## 2021-07-10 NOTE — Progress Notes (Signed)
Remote pacemaker transmission.   

## 2021-08-16 DIAGNOSIS — E782 Mixed hyperlipidemia: Secondary | ICD-10-CM | POA: Diagnosis not present

## 2021-08-16 DIAGNOSIS — J4520 Mild intermittent asthma, uncomplicated: Secondary | ICD-10-CM | POA: Diagnosis not present

## 2021-08-16 DIAGNOSIS — I2510 Atherosclerotic heart disease of native coronary artery without angina pectoris: Secondary | ICD-10-CM | POA: Diagnosis not present

## 2021-08-16 DIAGNOSIS — E039 Hypothyroidism, unspecified: Secondary | ICD-10-CM | POA: Diagnosis not present

## 2021-08-16 DIAGNOSIS — I10 Essential (primary) hypertension: Secondary | ICD-10-CM | POA: Diagnosis not present

## 2021-09-03 DIAGNOSIS — J209 Acute bronchitis, unspecified: Secondary | ICD-10-CM | POA: Diagnosis not present

## 2021-09-03 DIAGNOSIS — J0140 Acute pansinusitis, unspecified: Secondary | ICD-10-CM | POA: Diagnosis not present

## 2021-09-28 DIAGNOSIS — I110 Hypertensive heart disease with heart failure: Secondary | ICD-10-CM | POA: Diagnosis not present

## 2021-09-28 DIAGNOSIS — E039 Hypothyroidism, unspecified: Secondary | ICD-10-CM | POA: Diagnosis not present

## 2021-09-28 DIAGNOSIS — J45909 Unspecified asthma, uncomplicated: Secondary | ICD-10-CM | POA: Diagnosis not present

## 2021-09-28 DIAGNOSIS — I495 Sick sinus syndrome: Secondary | ICD-10-CM | POA: Diagnosis not present

## 2021-09-28 DIAGNOSIS — Z008 Encounter for other general examination: Secondary | ICD-10-CM | POA: Diagnosis not present

## 2021-09-28 DIAGNOSIS — I509 Heart failure, unspecified: Secondary | ICD-10-CM | POA: Diagnosis not present

## 2021-09-28 DIAGNOSIS — E785 Hyperlipidemia, unspecified: Secondary | ICD-10-CM | POA: Diagnosis not present

## 2021-09-28 DIAGNOSIS — M545 Low back pain, unspecified: Secondary | ICD-10-CM | POA: Diagnosis not present

## 2021-09-28 DIAGNOSIS — E663 Overweight: Secondary | ICD-10-CM | POA: Diagnosis not present

## 2021-09-28 DIAGNOSIS — L409 Psoriasis, unspecified: Secondary | ICD-10-CM | POA: Diagnosis not present

## 2021-09-28 DIAGNOSIS — I2510 Atherosclerotic heart disease of native coronary artery without angina pectoris: Secondary | ICD-10-CM | POA: Diagnosis not present

## 2021-09-28 DIAGNOSIS — K219 Gastro-esophageal reflux disease without esophagitis: Secondary | ICD-10-CM | POA: Diagnosis not present

## 2021-09-28 DIAGNOSIS — M1990 Unspecified osteoarthritis, unspecified site: Secondary | ICD-10-CM | POA: Diagnosis not present

## 2021-10-30 ENCOUNTER — Ambulatory Visit (INDEPENDENT_AMBULATORY_CARE_PROVIDER_SITE_OTHER): Payer: Medicare HMO

## 2021-10-30 DIAGNOSIS — I495 Sick sinus syndrome: Secondary | ICD-10-CM | POA: Diagnosis not present

## 2021-10-31 LAB — CUP PACEART REMOTE DEVICE CHECK
Battery Impedance: 575 Ohm
Battery Remaining Longevity: 60 mo
Battery Voltage: 2.77 V
Brady Statistic AP VP Percent: 1 %
Brady Statistic AP VS Percent: 84 %
Brady Statistic AS VP Percent: 0 %
Brady Statistic AS VS Percent: 15 %
Date Time Interrogation Session: 20230530141223
Implantable Lead Implant Date: 19970926
Implantable Lead Implant Date: 19970926
Implantable Lead Location: 753859
Implantable Lead Location: 753860
Implantable Lead Model: 4024
Implantable Lead Model: 4524
Implantable Pulse Generator Implant Date: 20171214
Lead Channel Impedance Value: 322 Ohm
Lead Channel Impedance Value: 935 Ohm
Lead Channel Pacing Threshold Amplitude: 0.75 V
Lead Channel Pacing Threshold Amplitude: 1.75 V
Lead Channel Pacing Threshold Pulse Width: 0.4 ms
Lead Channel Pacing Threshold Pulse Width: 0.4 ms
Lead Channel Setting Pacing Amplitude: 2.5 V
Lead Channel Setting Pacing Amplitude: 2.5 V
Lead Channel Setting Pacing Pulse Width: 0.4 ms
Lead Channel Setting Sensing Sensitivity: 5.6 mV

## 2021-11-13 NOTE — Progress Notes (Signed)
Remote pacemaker transmission.   

## 2021-11-22 DIAGNOSIS — J4520 Mild intermittent asthma, uncomplicated: Secondary | ICD-10-CM | POA: Diagnosis not present

## 2021-11-22 DIAGNOSIS — I10 Essential (primary) hypertension: Secondary | ICD-10-CM | POA: Diagnosis not present

## 2021-11-22 DIAGNOSIS — I5032 Chronic diastolic (congestive) heart failure: Secondary | ICD-10-CM | POA: Diagnosis not present

## 2021-11-22 DIAGNOSIS — E782 Mixed hyperlipidemia: Secondary | ICD-10-CM | POA: Diagnosis not present

## 2021-12-06 DIAGNOSIS — N12 Tubulo-interstitial nephritis, not specified as acute or chronic: Secondary | ICD-10-CM | POA: Diagnosis not present

## 2021-12-06 DIAGNOSIS — R109 Unspecified abdominal pain: Secondary | ICD-10-CM | POA: Diagnosis not present

## 2021-12-06 DIAGNOSIS — R300 Dysuria: Secondary | ICD-10-CM | POA: Diagnosis not present

## 2021-12-23 DIAGNOSIS — N390 Urinary tract infection, site not specified: Secondary | ICD-10-CM | POA: Diagnosis not present

## 2021-12-23 DIAGNOSIS — J019 Acute sinusitis, unspecified: Secondary | ICD-10-CM | POA: Diagnosis not present

## 2021-12-23 DIAGNOSIS — R059 Cough, unspecified: Secondary | ICD-10-CM | POA: Diagnosis not present

## 2022-01-08 ENCOUNTER — Emergency Department (HOSPITAL_BASED_OUTPATIENT_CLINIC_OR_DEPARTMENT_OTHER)
Admission: EM | Admit: 2022-01-08 | Discharge: 2022-01-08 | Disposition: A | Discharge status: Home / Self Care | Payer: Medicare HMO | Attending: Emergency Medicine | Admitting: Emergency Medicine

## 2022-01-08 ENCOUNTER — Emergency Department (HOSPITAL_BASED_OUTPATIENT_CLINIC_OR_DEPARTMENT_OTHER): Payer: Medicare HMO

## 2022-01-08 ENCOUNTER — Other Ambulatory Visit: Payer: Self-pay

## 2022-01-08 ENCOUNTER — Encounter (HOSPITAL_BASED_OUTPATIENT_CLINIC_OR_DEPARTMENT_OTHER): Payer: Self-pay | Admitting: Pediatrics

## 2022-01-08 DIAGNOSIS — M5441 Lumbago with sciatica, right side: Secondary | ICD-10-CM | POA: Diagnosis not present

## 2022-01-08 DIAGNOSIS — M160 Bilateral primary osteoarthritis of hip: Secondary | ICD-10-CM | POA: Diagnosis not present

## 2022-01-08 DIAGNOSIS — M545 Low back pain, unspecified: Secondary | ICD-10-CM | POA: Diagnosis not present

## 2022-01-08 DIAGNOSIS — I10 Essential (primary) hypertension: Secondary | ICD-10-CM | POA: Insufficient documentation

## 2022-01-08 DIAGNOSIS — Z79899 Other long term (current) drug therapy: Secondary | ICD-10-CM | POA: Diagnosis not present

## 2022-01-08 DIAGNOSIS — Z853 Personal history of malignant neoplasm of breast: Secondary | ICD-10-CM | POA: Insufficient documentation

## 2022-01-08 DIAGNOSIS — Z7982 Long term (current) use of aspirin: Secondary | ICD-10-CM | POA: Insufficient documentation

## 2022-01-08 DIAGNOSIS — E039 Hypothyroidism, unspecified: Secondary | ICD-10-CM | POA: Diagnosis not present

## 2022-01-08 DIAGNOSIS — G8929 Other chronic pain: Secondary | ICD-10-CM | POA: Diagnosis not present

## 2022-01-08 DIAGNOSIS — M549 Dorsalgia, unspecified: Secondary | ICD-10-CM | POA: Diagnosis not present

## 2022-01-08 DIAGNOSIS — M47816 Spondylosis without myelopathy or radiculopathy, lumbar region: Secondary | ICD-10-CM | POA: Diagnosis not present

## 2022-01-08 MED ORDER — METHYLPREDNISOLONE 4 MG PO TBPK: ORAL_TABLET | ORAL | 0 refills | Status: DC

## 2022-01-08 MED ORDER — LIDOCAINE 5 % EX PTCH
1.0000 | MEDICATED_PATCH | CUTANEOUS | Status: DC
  Administered 2022-01-08: 1 via TRANSDERMAL
  Filled 2022-01-08: qty 1

## 2022-01-08 MED ORDER — ACETAMINOPHEN 500 MG PO TABS
1000.0000 mg | ORAL_TABLET | Freq: Once | ORAL | Status: AC
  Administered 2022-01-08: 1000 mg via ORAL
  Filled 2022-01-08: qty 2

## 2022-01-08 MED ORDER — METHOCARBAMOL 500 MG PO TABS: 500.0000 mg | ORAL_TABLET | Freq: Every evening | ORAL | 0 refills | Status: DC

## 2022-01-08 NOTE — ED Provider Notes (Signed)
[place] EMERGENCY DEPARTMENT Provider Note   CSN: [id number] Arrival date & time: 01/08/22  1007     History  Chief Complaint  Patient presents with   Back Pain    [name] is a 76 y.o. female.  HPI 76 year old female with a history of hypertension, hyperlipidemia, hypothyroidism, peptic ulcer disease, malignant neoplasm of the left breast in remission, squamous cell carcinoma, arthritis presents to the ER with concerns for low back pain.  Patient states that this has been ongoing for the last week and a half.  No falls or heavy lifting and no inciting incident.  She reports spasming in her lower back, which is worsened when she sits.  She also states that when she sits for long periods of time or stands up she will feel a burning in the back of her leg that radiates down her leg.  She denies any loss of bowel or bladder control.  Denies any foot drop.  Reports that standing relieves the pain.  She has not taken anything for pain.  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    Home Medications Prior to Admission medications   Medication Sig Start Date End Date Taking? Authorizing Provider  aspirin EC 81 MG tablet Take 1 tablet (81 mg total) by mouth every Monday, Wednesday, and Friday. Swallow whole. 12/21/19  Yes Belva Crome, MD  atorvastatin (LIPITOR) 80 MG tablet Take 1 tablet (80 mg total) by mouth daily. 11/04/17 01/08/22 Yes Belva Crome, MD  levothyroxine (SYNTHROID) 75 MCG tablet Take 75 mcg by mouth daily. 05/23/21  Yes [provider]  methocarbamol (ROBAXIN) 500 MG tablet Take 1 tablet (500 mg total) by mouth every evening. 01/08/22  Yes Garald Balding, PA-C  methylPREDNISolone (MEDROL DOSEPAK) 4 MG TBPK tablet Take as directed until finished 01/08/22  Yes Brenson Hartman A, PA-C  ezetimibe (ZETIA) 10 MG tablet TAKE ONE TABLET BY MOUTH EVERYDAY AT BEDTIME 05/21/21   Belva Crome, MD  fluticasone [facility]) 50 MCG/ACT nasal spray 2 spray in each nostril 04/18/16   [provider]  imiquimod (ALDARA) 5 % cream Apply topically at bedtime. 03/05/21   Lavonna Monarch, MD  MODERNA COVID-19 VACCINE 100 MCG/0.5ML injection  10/15/20   [provider]  Omega-3 Fatty Acids (FISH OIL) 1000 MG CAPS Take 1,000 mg by mouth daily.    [provider]  omeprazole (PRILOSEC) 40 MG capsule Take 40 mg by mouth daily. 02/21/21   [provider]  ondansetron (ZOFRAN) 4 MG tablet Take 1 tablet (4 mg total) by mouth every 8 (eight) hours as needed for nausea or vomiting. 12/04/19   Hayden Rasmussen, MD  tamoxifen (NOLVADEX) 20 MG tablet TAKE ONE TABLET BY MOUTH EVERY MORNING Patient not taking: Reported on 06/11/2021 05/23/21   Magrinat, Virgie Dad, MD  triamterene-hydrochlorothiazide (MAXZIDE) 75-50 MG tablet Take 1 tablet by mouth every evening.     [provider]  valACYclovir (VALTREX) 1000 MG tablet Take 1,000 mg by mouth 3 (three) times daily. Patient not taking: Reported on 06/11/2021 01/20/21   [provider]      Allergies    Bee venom, Morphine and related, Nsaids, Wasp venom, Penicillins, and Sulfa antibiotics    Review of Systems   Review of Systems Ten systems reviewed and are negative for acute change, except as noted in the HPI.  Physical Exam Updated Vital Signs BP (!) 147/64 (BP Location: Left Arm)   Pulse (!) 59   Temp 97.7 F (36.5 C) (Oral)   Resp 18   Ht '5\' 1"'$  (1.549 m)   Wt 66.7 kg   SpO2 99%   BMI 27.78 kg/m  Physical Exam Vitals and nursing note reviewed.  Constitutional:      General: She is not in acute distress.    Appearance: She is well-developed.  HENT:     Head: Normocephalic and atraumatic.  Eyes:     Conjunctiva/sclera: Conjunctivae normal.  Cardiovascular:     Rate and Rhythm: Normal rate and regular rhythm.     Heart sounds: No murmur heard. Pulmonary:     Effort: Pulmonary effort  is normal. No respiratory distress.     Breath sounds: Normal breath sounds.  Abdominal:     Palpations: Abdomen is soft.     Tenderness: There is no abdominal tenderness. There is no right CVA tenderness or left CVA tenderness.  Musculoskeletal:        General: No swelling.     Cervical back: Neck supple.     Comments: Tenderness to the lumbar spine with associated paraspinal muscle tenderness and tenderness to the bilateral SI joints.  5/5 strength in upper and lower extremities bilaterally.  Sensations are intact.  No noticeable step-offs or crepitus.  Patient ambulating without foot drop.  Skin:    General: Skin is warm and dry.     Capillary Refill: Capillary refill takes less than 2 seconds.  Neurological:     Mental Status: She is alert.  Psychiatric:        Mood and Affect: Mood normal.    ED Results / Procedures / Treatments   Labs (all labs ordered are listed, but only abnormal results are displayed) Labs Reviewed - No data to display  EKG None  Radiology DG HIPS BILAT WITH PELVIS 3-4 VIEWS  Result Date: 01/08/2022 CLINICAL DATA:  Chronic back pain EXAM: DG HIP (WITH OR WITHOUT PELVIS) 3-4V BILAT COMPARISON:  None Available. FINDINGS: There is no evidence of hip fracture or dislocation. Mild degenerative changes of the bilateral hips. Pelvic bony ring intact. IMPRESSION: Mild degenerative changes of the bilateral hips. Electronically Signed   By: Davina Poke D.O.   On: 01/08/2022 11:08   DG Lumbar Spine Complete  Result Date: 01/08/2022 CLINICAL DATA:  Chronic back pain EXAM: LUMBAR SPINE - COMPLETE 4+ VIEW COMPARISON:  10/29/2014 FINDINGS: There is no evidence of lumbar spine fracture. Grade 1 anterolisthesis L4 on L5 and L5 on S1 likely mediated by degenerative facet arthropathy. Mild disc height loss at L1-2, L4-5, and L5-S1. Moderate lower lumbar facet arthropathy. Advanced aortoiliac atherosclerotic calcifications. IMPRESSION: 1. No acute fracture or traumatic  subluxation of the lumbar spine. 2. Facet predominant multilevel lumbar spondylosis. 3. Advanced aortoiliac atherosclerotic calcifications. Electronically Signed   By: Davina Poke D.O.   On: 01/08/2022 11:07    Procedures Procedures    Medications Ordered in ED Medications  lidocaine (LIDODERM) 5 % 1 patch (1 patch Transdermal Patch Applied 01/08/22 1038)  acetaminophen (TYLENOL) tablet 1,000 mg (has no administration in time range)    ED Course/ Medical Decision Making/ A&P                           Medical Decision Making Amount and/or Complexity of Data Reviewed Radiology: ordered.  Risk Prescription drug management.   76 year old female presenting  with low back pain and pain rating down her right leg.  On arrival, she is slightly hypertensive but afebrile, tachycardic, tachypneic or hypoxic.  Her physical exam is overall reassuring, she is able to ambulate without foot drop.  She does have some tenderness to the lumbar spine and bilateral SI joints.  Strength and sensation are intact.  Differential diagnosis includes musculoskeletal strain, sciatica, compression fracture, metastatic disease.  I ordered, reviewed and interpreted radiographic imaging, agree with radiology read.  Plain films of the hips bilaterally with mild degenerative changes, plain films of her lumbar spine with no evidence of acute fracture, subluxation and no evidence of metastatic disease.  Some advanced aortic atherosclerotic calcifications were noted.   No evidence of metastatic disease on imaging, low suspicion for cauda equina given no loss of bowel or bladder control, no foot drop. .  I have low suspicion for AAA as she has no chest pain, pain is reproducible, no dizziness or near syncope. Based on exam and story, pain is MSK in nature. Low suspicion for UTI given no CVA tenderness and no urinary complaints. Suspect her symptoms are due to spasms/sciatica  She was given Tylenol and lidocaine patches for  pain.  Will start her on a course of prednisone for suspected sciatica given radiation down her leg.  Will also start her on a muscle relaxer, she was given very thorough education and informed to take this at night do not drink or drive on the medication and to be careful when standing up while on this medication.  She may also benefit from gabapentin moving forward however I will defer this to her primary care doctor for close monitoring and dose adjustments.  She agrees to follow-up with her primary care doctor, she also may benefit from physical therapy.  We discussed return precautions.  She was understanding and is agreeable.  Stable for discharge.    Final Clinical Impression(s) / ED Diagnoses Final diagnoses:  Acute bilateral low back pain with right-sided sciatica    Rx / DC Orders ED Discharge Orders          Ordered    methylPREDNISolone (MEDROL DOSEPAK) 4 MG TBPK tablet        01/08/22 1129    methocarbamol (ROBAXIN) 500 MG tablet  Every evening        01/08/22 1129              Garald Balding, PA-C 01/08/22 1131    Sherwood Gambler, MD 01/09/22 [id number]

## 2022-01-08 NOTE — Discharge Instructions (Addendum)
You were evaluated in the Emergency Department and after careful evaluation, we did not find any emergent condition requiring admission or further testing in the hospital.  Your x-rays today were overall reassuring, though did show some evidence of arthritis.  I suspect her symptoms are due to muscle spasms and sciatica.  Please take the prescribed steroid course as directed until finished.  I have also prescribed a muscle relaxer, please be very careful when taking this, take at night and do not drink or drive on this medication.  Be careful when standing up while on this medication. I have also provided a handout with some exercises and stretches that could help you.  You may also use topical voltaren gel which can be found at your local pharmacy. Please follow-up with your primary care doctor if your symptoms persist.  You may benefit from starting a medication called gabapentin and physical therapy.  Please return to the Emergency Department if you experience any worsening of your condition.   Thank you for allowing [place] to be a part of your care.

## 2022-01-08 NOTE — ED Triage Notes (Signed)
C/O low back pain x 2 weeks; started on right buttocks and now it progressed on hip and radiating down the leg; reports now affected activity with increased pain; denies any recent fall / injury;

## 2022-01-08 NOTE — ED Notes (Signed)
ED Provider at bedside. 

## 2022-01-08 NOTE — ED Notes (Signed)
Pt transported to radiology.

## 2022-01-14 DIAGNOSIS — M5430 Sciatica, unspecified side: Secondary | ICD-10-CM | POA: Diagnosis not present

## 2022-01-14 DIAGNOSIS — M62830 Muscle spasm of back: Secondary | ICD-10-CM | POA: Diagnosis not present

## 2022-02-13 ENCOUNTER — Ambulatory Visit (INDEPENDENT_AMBULATORY_CARE_PROVIDER_SITE_OTHER): Payer: Medicare HMO

## 2022-02-13 DIAGNOSIS — I495 Sick sinus syndrome: Secondary | ICD-10-CM | POA: Diagnosis not present

## 2022-02-15 LAB — CUP PACEART REMOTE DEVICE CHECK
Battery Impedance: 626 Ohm
Battery Remaining Longevity: 58 mo
Battery Voltage: 2.76 V
Brady Statistic AP VP Percent: 1 %
Brady Statistic AP VS Percent: 85 %
Brady Statistic AS VP Percent: 0 %
Brady Statistic AS VS Percent: 14 %
Date Time Interrogation Session: 20230913190420
Implantable Lead Implant Date: 19970926
Implantable Lead Implant Date: 19970926
Implantable Lead Location: 753859
Implantable Lead Location: 753860
Implantable Lead Model: 4024
Implantable Lead Model: 4524
Implantable Pulse Generator Implant Date: 20171214
Lead Channel Impedance Value: 326 Ohm
Lead Channel Impedance Value: 943 Ohm
Lead Channel Pacing Threshold Amplitude: 0.75 V
Lead Channel Pacing Threshold Amplitude: 1.5 V
Lead Channel Pacing Threshold Pulse Width: 0.4 ms
Lead Channel Pacing Threshold Pulse Width: 0.4 ms
Lead Channel Setting Pacing Amplitude: 2.5 V
Lead Channel Setting Pacing Amplitude: 2.5 V
Lead Channel Setting Pacing Pulse Width: 0.4 ms
Lead Channel Setting Sensing Sensitivity: 5.6 mV

## 2022-02-26 NOTE — Progress Notes (Signed)
Remote pacemaker transmission.   

## 2022-03-04 DIAGNOSIS — Z23 Encounter for immunization: Secondary | ICD-10-CM | POA: Diagnosis not present

## 2022-03-18 DIAGNOSIS — I10 Essential (primary) hypertension: Secondary | ICD-10-CM | POA: Diagnosis not present

## 2022-03-18 DIAGNOSIS — R7301 Impaired fasting glucose: Secondary | ICD-10-CM | POA: Diagnosis not present

## 2022-03-18 DIAGNOSIS — E039 Hypothyroidism, unspecified: Secondary | ICD-10-CM | POA: Diagnosis not present

## 2022-03-18 DIAGNOSIS — E782 Mixed hyperlipidemia: Secondary | ICD-10-CM | POA: Diagnosis not present

## 2022-03-21 DIAGNOSIS — R7303 Prediabetes: Secondary | ICD-10-CM | POA: Diagnosis not present

## 2022-03-21 DIAGNOSIS — J4520 Mild intermittent asthma, uncomplicated: Secondary | ICD-10-CM | POA: Diagnosis not present

## 2022-03-21 DIAGNOSIS — I10 Essential (primary) hypertension: Secondary | ICD-10-CM | POA: Diagnosis not present

## 2022-03-21 DIAGNOSIS — E782 Mixed hyperlipidemia: Secondary | ICD-10-CM | POA: Diagnosis not present

## 2022-03-21 DIAGNOSIS — Z6828 Body mass index (BMI) 28.0-28.9, adult: Secondary | ICD-10-CM | POA: Diagnosis not present

## 2022-03-21 DIAGNOSIS — E559 Vitamin D deficiency, unspecified: Secondary | ICD-10-CM | POA: Diagnosis not present

## 2022-03-21 DIAGNOSIS — I700 Atherosclerosis of aorta: Secondary | ICD-10-CM | POA: Diagnosis not present

## 2022-03-21 DIAGNOSIS — Z8719 Personal history of other diseases of the digestive system: Secondary | ICD-10-CM | POA: Diagnosis not present

## 2022-03-21 DIAGNOSIS — Z0000 Encounter for general adult medical examination without abnormal findings: Secondary | ICD-10-CM | POA: Diagnosis not present

## 2022-03-21 DIAGNOSIS — E039 Hypothyroidism, unspecified: Secondary | ICD-10-CM | POA: Diagnosis not present

## 2022-03-21 DIAGNOSIS — M8588 Other specified disorders of bone density and structure, other site: Secondary | ICD-10-CM | POA: Diagnosis not present

## 2022-04-10 DIAGNOSIS — Z6829 Body mass index (BMI) 29.0-29.9, adult: Secondary | ICD-10-CM | POA: Diagnosis not present

## 2022-04-10 DIAGNOSIS — J019 Acute sinusitis, unspecified: Secondary | ICD-10-CM | POA: Diagnosis not present

## 2022-04-10 DIAGNOSIS — J45909 Unspecified asthma, uncomplicated: Secondary | ICD-10-CM | POA: Diagnosis not present

## 2022-05-07 DIAGNOSIS — Z112 Encounter for screening for other bacterial diseases: Secondary | ICD-10-CM | POA: Diagnosis not present

## 2022-05-08 DIAGNOSIS — Z1231 Encounter for screening mammogram for malignant neoplasm of breast: Secondary | ICD-10-CM | POA: Diagnosis not present

## 2022-05-15 ENCOUNTER — Ambulatory Visit (INDEPENDENT_AMBULATORY_CARE_PROVIDER_SITE_OTHER): Payer: Medicare HMO

## 2022-05-15 DIAGNOSIS — I495 Sick sinus syndrome: Secondary | ICD-10-CM | POA: Diagnosis not present

## 2022-05-19 LAB — CUP PACEART REMOTE DEVICE CHECK
Battery Impedance: 727 Ohm
Battery Remaining Longevity: 54 mo
Battery Voltage: 2.76 V
Brady Statistic AP VP Percent: 1 %
Brady Statistic AP VS Percent: 83 %
Brady Statistic AS VP Percent: 0 %
Brady Statistic AS VS Percent: 15 %
Date Time Interrogation Session: 20231215150507
Implantable Lead Connection Status: 753985
Implantable Lead Connection Status: 753985
Implantable Lead Implant Date: 19970926
Implantable Lead Implant Date: 19970926
Implantable Lead Location: 753859
Implantable Lead Location: 753860
Implantable Lead Model: 4024
Implantable Lead Model: 4524
Implantable Pulse Generator Implant Date: 20171214
Lead Channel Impedance Value: 318 Ohm
Lead Channel Impedance Value: 871 Ohm
Lead Channel Pacing Threshold Amplitude: 0.75 V
Lead Channel Pacing Threshold Amplitude: 1.5 V
Lead Channel Pacing Threshold Pulse Width: 0.4 ms
Lead Channel Pacing Threshold Pulse Width: 0.4 ms
Lead Channel Setting Pacing Amplitude: 2.5 V
Lead Channel Setting Pacing Amplitude: 2.5 V
Lead Channel Setting Pacing Pulse Width: 0.4 ms
Lead Channel Setting Sensing Sensitivity: 4 mV
Zone Setting Status: 755011
Zone Setting Status: 755011

## 2022-06-03 DIAGNOSIS — S299XXA Unspecified injury of thorax, initial encounter: Secondary | ICD-10-CM | POA: Diagnosis not present

## 2022-06-03 DIAGNOSIS — S2220XA Unspecified fracture of sternum, initial encounter for closed fracture: Secondary | ICD-10-CM | POA: Diagnosis not present

## 2022-06-04 NOTE — Progress Notes (Deleted)
Cardiology Office Note:    Date:  06/04/2022   ID:  [name], DOB 03-17-1946, MRN [id number]  PCP:  Lawerance Cruel, [facility] Cardiologist:  [name], MD {  Referring MD: Lawerance Cruel, MD    History of Present Illness:    [name] is a 77 y.o. female with a hx of HLD, chronic diastolic HF, SSS s/p PPM placement, history of breast cancer s/p chemo and XRT, mild carotid artery disease who was previously followed by Dr. Tamala Julian who now presents to clinic for follow-up.  Was last seen in 06/2021 by Dr. Caryl Comes for follow-up of PPM where she was doing well.  Today ***  Past Medical History:  Diagnosis Date   Allergic rhinitis    Arthritis    Family history of breast cancer    Family history of colon cancer    Family history of pancreatic cancer    GERD (gastroesophageal reflux disease)    GI bleed    History of basal cell cancer 11/08/2008   RIGHT SUBAURICULAR   History of radiation therapy 08/05/2016 - 09/12/2016   Left Breast 50.4 Gy 28 fractions   Hyperlipidemia    Hypertension    Hypothyroidism    Lumbar disc disease    Malignant neoplasm of upper-outer quadrant of left female breast ([place]) 04/11/2016   Pacemaker    Peptic ulcer disease    SCC (squamous cell carcinoma) 09/09/2017   LEFT WRIST CX 3 5FU   Squamous cell carcinoma of skin 05/30/2013   LEFT LOWER BACK   SSS (sick sinus syndrome) ([place])    Thyroid disease     Past Surgical History:  Procedure Laterality Date   ABDOMINAL HYSTERECTOMY     BREAST LUMPECTOMY WITH RADIOACTIVE SEED AND SENTINEL LYMPH NODE BIOPSY Left 06/06/2016   Procedure: LEFT BREAST LUMPECTOMY WITH RADIOACTIVE SEED AND LEFT AXILLARY SENTINEL LYMPH NODE BIOPSY;  Surgeon: Alphonsa Overall, MD;  Location: [facility];  Service: General;  Laterality: Left;   BREAST SURGERY     left   EP IMPLANTABLE DEVICE N/A 05/15/2016   Procedure: PPM Generator Changeout;  Surgeon: Evans Lance, MD;  Location: [facility];  Service: Cardiovascular;  Laterality: N/A;   HERNIA REPAIR     LEFT HEART CATH AND CORONARY ANGIOGRAPHY N/A 10/09/2017   Procedure: LEFT HEART CATH AND CORONARY ANGIOGRAPHY;  Surgeon: Belva Crome, MD;  Location: [facility];  Service: Cardiovascular;  Laterality: N/A;   PACEMAKER INSERTION      Current Medications: No outpatient medications have been marked as taking for the 06/10/22 encounter (Appointment) with Freada Bergeron, MD.     Allergies:   Bee venom, Morphine and related, Nsaids, Wasp venom, Penicillins, and Sulfa antibiotics   Social History   Socioeconomic History   Marital status: Married    Spouse name: Not on file   Number of children: 2   Years of education: Not on file   Highest education level: Not on file  Occupational History   Occupation: house cleaner  Tobacco Use   Smoking status: Never   Smokeless tobacco: Never  Vaping Use   Vaping Use: Never used  Substance and Sexual Activity   Alcohol use: No   Drug use: No   Sexual activity: Never    Birth control/protection: Abstinence, None  Other Topics Concern   Not on file  Social History Narrative   Not on file  Social Determinants of Health   Financial Resource Strain: Not on file  Food Insecurity: Not on file  Transportation Needs: Not on file  Physical Activity: Not on file  Stress: Not on file  Social Connections: Not on file     Family History: The patient's ***family history includes Bone cancer in her father; Bradycardia in her cousin; Breast cancer (age of onset: 67) in her cousin and cousin; Breast cancer (age of onset: 47) in her sister; Colon cancer (age of onset: 54) in her cousin; Congestive Heart Failure in her mother; Heart attack in her maternal grandmother and paternal grandmother; Hypertension in her father; Melanoma in her daughter; Pancreatic cancer (age of onset: 70) in her paternal aunt; Rectal cancer (age of onset: 31) in her paternal aunt; Stroke  in her paternal grandfather.  ROS:   Please see the history of present illness.    *** All other systems reviewed and are negative.  EKGs/Labs/Other Studies Reviewed:    The following studies were reviewed today: Carotid ultrasound 2021: RIGHT CAROTID ARTERY: No significant calcified disease of the right common carotid artery. Intermediate waveform maintained. Heterogeneous plaque without significant calcifications at the right carotid bifurcation. Low resistance waveform of the right ICA. Significant tortuosity.   RIGHT VERTEBRAL ARTERY: Antegrade flow with low resistance waveform.   LEFT CAROTID ARTERY: No significant calcified disease of the left common carotid artery. Intermediate waveform maintained. Heterogeneous plaque at the left carotid bifurcation without significant calcifications. Low resistance waveform of the left ICA. Significant tortuosity   LEFT VERTEBRAL ARTERY:  Antegrade flow with low resistance waveform.   IMPRESSION: Color duplex indicates moderate heterogeneous plaque with no hemodynamically significant stenosis by duplex criteria in the extracranial cerebrovascular circulation.  LHC 09/2017: 30 to 40% ostial left main.  There is ostial calcification noted as well. LAD wraps around the left ventricular apex.  It contains luminal irregularities from the mid to distal vessel obstructing up to 30%.  No high-grade focal stenosis is noted. Circumflex is dominant.  The PDA contains diffuse 60 to 70% proximal narrowing.  Irregularities are noted in the mid vessel. Nondominant right coronary without obstructive disease. Normal left ventricular systolic function with EF 55 to 60%.  Elevated LVEDP of 21 millimeters mercury.  Findings are compatible with chronic diastolic heart failure.   RECOMMENDATIONS:   Aggressive risk factor modification including lipid-lowering LDL less than 70.  High blood pressure control 130/80 mmHg or less.  Adjust medications to lower  LVEDP.  May need to add low-dose diuretic therapy.   TE 2019: Study Conclusions   - Left ventricle: The cavity size was normal. There was moderate    concentric hypertrophy. Systolic function was normal. The    estimated ejection fraction was in the range of 55% to 60%. Mild    hypokinesis of the anteroseptal and inferoseptal myocardium.    Doppler parameters are consistent with abnormal left ventricular    relaxation (grade 1 diastolic dysfunction). Doppler parameters    are consistent with indeterminate ventricular filling pressure.  - Aortic valve: Transvalvular velocity was within the normal range.    There was no stenosis. There was no regurgitation.  - Aorta: Ascending aortic diameter: 38 mm (S).  - Ascending aorta: The ascending aorta was mildly dilated.  - Left atrium: The atrium was moderately dilated.  - Right ventricle: The cavity size was normal. Wall thickness was    normal. Systolic function was normal.  - Atrial septum: No defect or patent foramen ovale was identified  by color flow Doppler.  - Tricuspid valve: There was mild regurgitation.  - Pulmonary arteries: Systolic pressure was within the normal    range. PA peak pressure: 29 mm Hg (S).   EKG:  EKG is *** ordered today.  The ekg ordered today demonstrates ***  Recent Labs: No results found for requested labs within last 365 days.  Recent Lipid Panel    Component Value Date/Time   CHOL 153 02/28/2020 1116   TRIG 181 (H) 02/28/2020 1116   HDL 48 02/28/2020 1116   CHOLHDL 3.2 02/28/2020 1116   LDLCALC 75 02/28/2020 1116     Risk Assessment/Calculations:   {Does this patient have ATRIAL FIBRILLATION?:[phone]}  No BP recorded.  {Refresh Note OR Click here to enter BP  :1}***         Physical Exam:    VS:  There were no vitals taken for this visit.    Wt Readings from Last 3 Encounters:  01/08/22 147 lb (66.7 kg)  06/11/21 156 lb 3.2 oz (70.9 kg)  05/14/21 156 lb (70.8 kg)     GEN: ***  Well nourished, well developed in no acute distress HEENT: Normal NECK: No JVD; No carotid bruits LYMPHATICS: No lymphadenopathy CARDIAC: ***RRR, no murmurs, rubs, gallops RESPIRATORY:  Clear to auscultation without rales, wheezing or rhonchi  ABDOMEN: Soft, non-tender, non-distended MUSCULOSKELETAL:  No edema; No deformity  SKIN: Warm and dry NEUROLOGIC:  Alert and oriented x 3 PSYCHIATRIC:  Normal affect   ASSESSMENT:    No diagnosis found. PLAN:    In order of problems listed above:  #Chronic Diastolic HF: TTE 9407 with LVEF 55-60%, G1DD, normal RV, no significant valve disease. Currently, euvolemic and compensated on exam.   #Nonobstructive CAD: Cath 2019 with 30-40% LM, PDA with 60-70% proximal disease, nondominant RCA. Currently, doing well without anginal symptoms. -Continue ASA '81mg'$  daily -Continue lipitor '80mg'$  daily -Continue zetia '10mg'$  daily  #HLD: -Continue lipitor '80mg'$  daily -Continue zetia '10mg'$  daily  #HTN: Controlled and at goal. -Continue triamterene-HCTZ 75-'50mg'$  daily  #SSS s/p PPM Placement: -Follows with Dr. Scheryl Darter you ordering a CV Procedure (e.g. stress test, cath, DCCV, TEE, etc)?   Press F2        :[id number]}    Medication Adjustments/Labs and Tests Ordered: Current medicines are reviewed at length with the patient today.  Concerns regarding medicines are outlined above.  No orders of the defined types were placed in this encounter.  No orders of the defined types were placed in this encounter.   There are no Patient Instructions on file for this visit.   Signed, Freada Bergeron, MD  06/04/2022 7:54 PM    [place]

## 2022-06-09 NOTE — Progress Notes (Incomplete)
Cardiology Office Note:    Date:  06/09/2022   ID:  [name], DOB 1945-11-02, MRN [id number]  PCP:  Lawerance Cruel, [place] Providers Cardiologist:  [name], MD {  Referring MD: Lawerance Cruel, MD    History of Present Illness:    [name] is a 77 y.o. female with a hx of HLD, chronic diastolic HF, SSS s/p PPM placement, history of breast cancer s/p chemo and XRT, mild carotid artery disease who was previously followed by Dr. Tamala Julian who now presents to clinic for follow-up.  Was last seen in 06/2021 by Dr. Caryl Comes for follow-up of PPM where she was doing well.  Today the patient states that ***  She denies any palpitations, chest pain, shortness of breath, or peripheral edema. No lightheadedness, headaches, syncope, orthopnea, or PND.   Past Medical History:  Diagnosis Date   Allergic rhinitis    Arthritis    Family history of breast cancer    Family history of colon cancer    Family history of pancreatic cancer    GERD (gastroesophageal reflux disease)    GI bleed    History of basal cell cancer 11/08/2008   RIGHT SUBAURICULAR   History of radiation therapy 08/05/2016 - 09/12/2016   Left Breast 50.4 Gy 28 fractions   Hyperlipidemia    Hypertension    Hypothyroidism    Lumbar disc disease    Malignant neoplasm of upper-outer quadrant of left female breast ([place]) 04/11/2016   Pacemaker    Peptic ulcer disease    SCC (squamous cell carcinoma) 09/09/2017   LEFT WRIST CX 3 5FU   Squamous cell carcinoma of skin 05/30/2013   LEFT LOWER BACK   SSS (sick sinus syndrome) ([place])    Thyroid disease     Past Surgical History:  Procedure Laterality Date   ABDOMINAL HYSTERECTOMY     BREAST LUMPECTOMY WITH RADIOACTIVE SEED AND SENTINEL LYMPH NODE BIOPSY Left 06/06/2016   Procedure: LEFT BREAST LUMPECTOMY WITH RADIOACTIVE SEED AND LEFT AXILLARY SENTINEL LYMPH NODE BIOPSY;  Surgeon: Alphonsa Overall, MD;  Location: [facility];  Service: General;   Laterality: Left;   BREAST SURGERY     left   EP IMPLANTABLE DEVICE N/A 05/15/2016   Procedure: PPM Generator Changeout;  Surgeon: Evans Lance, MD;  Location: [place] [facility];  Service: Cardiovascular;  Laterality: N/A;   HERNIA REPAIR     LEFT HEART CATH AND CORONARY ANGIOGRAPHY N/A 10/09/2017   Procedure: LEFT HEART CATH AND CORONARY ANGIOGRAPHY;  Surgeon: Belva Crome, MD;  Location: [place] [facility];  Service: Cardiovascular;  Laterality: N/A;   PACEMAKER INSERTION      Current Medications: No outpatient medications have been marked as taking for the 06/10/22 encounter (Appointment) with Freada Bergeron, MD.     Allergies:   Bee venom, Morphine and related, Nsaids, Wasp venom, Penicillins, and Sulfa antibiotics   Social History   Socioeconomic History   Marital status: Married    Spouse name: Not on file   Number of children: 2   Years of education: Not on file   Highest education level: Not on file  Occupational History   Occupation: house cleaner  Tobacco Use   Smoking status: Never   Smokeless tobacco: Never  Vaping Use   Vaping Use: Never used  Substance and Sexual Activity   Alcohol use: No   Drug use: No   Sexual activity: Never  Birth control/protection: Abstinence, None  Other Topics Concern   Not on file  Social History Narrative   Not on file   Social Determinants of Health   Financial Resource Strain: Not on file  Food Insecurity: Not on file  Transportation Needs: Not on file  Physical Activity: Not on file  Stress: Not on file  Social Connections: Not on file     Family History: The patient's family history includes Bone cancer in her father; Bradycardia in her cousin; Breast cancer (age of onset: 62) in her cousin and cousin; Breast cancer (age of onset: 67) in her sister; Colon cancer (age of onset: 54) in her cousin; Congestive Heart Failure in her mother; Heart attack in her maternal grandmother and paternal grandmother;  Hypertension in her father; Melanoma in her daughter; Pancreatic cancer (age of onset: 66) in her paternal aunt; Rectal cancer (age of onset: 25) in her paternal aunt; Stroke in her paternal grandfather.  ROS:   Review of Systems  Constitutional:  Negative for chills and fever.  HENT:  Negative for nosebleeds and tinnitus.   Eyes:  Negative for blurred vision and pain.  Respiratory:  Negative for cough, hemoptysis, shortness of breath and stridor.   Cardiovascular:  Negative for chest pain, palpitations, orthopnea, claudication, leg swelling and PND.  Gastrointestinal:  Negative for blood in stool, diarrhea, nausea and vomiting.  Genitourinary:  Negative for dysuria and hematuria.  Musculoskeletal:  Negative for falls.  Neurological:  Negative for dizziness, loss of consciousness and headaches.  Psychiatric/Behavioral:  Negative for depression, hallucinations and substance abuse. The patient does not have insomnia.      EKGs/Labs/Other Studies Reviewed:    The following studies were reviewed today:  Carotid ultrasound 2021: RIGHT CAROTID ARTERY: No significant calcified disease of the right common carotid artery. Intermediate waveform maintained. Heterogeneous plaque without significant calcifications at the right carotid bifurcation. Low resistance waveform of the right ICA. Significant tortuosity.   RIGHT VERTEBRAL ARTERY: Antegrade flow with low resistance waveform.   LEFT CAROTID ARTERY: No significant calcified disease of the left common carotid artery. Intermediate waveform maintained. Heterogeneous plaque at the left carotid bifurcation without significant calcifications. Low resistance waveform of the left ICA. Significant tortuosity   LEFT VERTEBRAL ARTERY:  Antegrade flow with low resistance waveform.   IMPRESSION: Color duplex indicates moderate heterogeneous plaque with no hemodynamically significant stenosis by duplex criteria in the extracranial cerebrovascular  circulation.  LHC 09/2017: 30 to 40% ostial left main.  There is ostial calcification noted as well. LAD wraps around the left ventricular apex.  It contains luminal irregularities from the mid to distal vessel obstructing up to 30%.  No high-grade focal stenosis is noted. Circumflex is dominant.  The PDA contains diffuse 60 to 70% proximal narrowing.  Irregularities are noted in the mid vessel. Nondominant right coronary without obstructive disease. Normal left ventricular systolic function with EF 55 to 60%.  Elevated LVEDP of 21 millimeters mercury.  Findings are compatible with chronic diastolic heart failure.   RECOMMENDATIONS:   Aggressive risk factor modification including lipid-lowering LDL less than 70.  High blood pressure control 130/80 mmHg or less.  Adjust medications to lower LVEDP.  May need to add low-dose diuretic therapy.   TE 2019: Study Conclusions   - Left ventricle: The cavity size was normal. There was moderate    concentric hypertrophy. Systolic function was normal. The    estimated ejection fraction was in the range of 55% to 60%. Mild    hypokinesis  of the anteroseptal and inferoseptal myocardium.    Doppler parameters are consistent with abnormal left ventricular    relaxation (grade 1 diastolic dysfunction). Doppler parameters    are consistent with indeterminate ventricular filling pressure.  - Aortic valve: Transvalvular velocity was within the normal range.    There was no stenosis. There was no regurgitation.  - Aorta: Ascending aortic diameter: 38 mm (S).  - Ascending aorta: The ascending aorta was mildly dilated.  - Left atrium: The atrium was moderately dilated.  - Right ventricle: The cavity size was normal. Wall thickness was    normal. Systolic function was normal.  - Atrial septum: No defect or patent foramen ovale was identified    by color flow Doppler.  - Tricuspid valve: There was mild regurgitation.  - Pulmonary arteries: Systolic pressure  was within the normal    range. PA peak pressure: 29 mm Hg (S).   EKG:  EKG is personally reviewed.  06/10/2022:  ***  Recent Labs: No results found for requested labs within last 365 days.   Recent Lipid Panel    Component Value Date/Time   CHOL 153 02/28/2020 1116   TRIG 181 (H) 02/28/2020 1116   HDL 48 02/28/2020 1116   CHOLHDL 3.2 02/28/2020 1116   LDLCALC 75 02/28/2020 1116     Risk Assessment/Calculations:   {Does this patient have ATRIAL FIBRILLATION?:[id number]}  No BP recorded.  {Refresh Note OR Click here to enter BP  :1}***         Physical Exam:    VS:  There were no vitals taken for this visit.    Wt Readings from Last 3 Encounters:  01/08/22 147 lb (66.7 kg)  06/11/21 156 lb 3.2 oz (70.9 kg)  05/14/21 156 lb (70.8 kg)     GEN: Well nourished, well developed in no acute distress HEENT: Normal NECK: No JVD; No carotid bruits LYMPHATICS: No lymphadenopathy CARDIAC: ***RRR, no murmurs, rubs, gallops RESPIRATORY:  Clear to auscultation without rales, wheezing or rhonchi  ABDOMEN: Soft, non-tender, non-distended MUSCULOSKELETAL:  No edema; No deformity  SKIN: Warm and dry NEUROLOGIC:  Alert and oriented x 3 PSYCHIATRIC:  Normal affect   ASSESSMENT:    No diagnosis found. PLAN:    In order of problems listed above:  #Chronic Diastolic HF: TTE 7989 with LVEF 55-60%, G1DD, normal RV, no significant valve disease. Currently, euvolemic and compensated on exam.   #Nonobstructive CAD: Cath 2019 with 30-40% LM, PDA with 60-70% proximal disease, nondominant RCA. Currently, doing well without anginal symptoms. -Continue ASA '81mg'$  daily -Continue lipitor '80mg'$  daily -Continue zetia '10mg'$  daily  #HLD: -Continue lipitor '80mg'$  daily -Continue zetia '10mg'$  daily  #HTN: Controlled and at goal. -Continue triamterene-HCTZ 75-'50mg'$  daily  #SSS s/p PPM Placement: -Follows with Dr. Scheryl Darter you ordering a CV Procedure (e.g. stress test, cath, DCCV, TEE,  etc)?   Press F2        :[id number]}  Follow-up:  Medication Adjustments/Labs and Tests Ordered: Current medicines are reviewed at length with the patient today.  Concerns regarding medicines are outlined above.   No orders of the defined types were placed in this encounter.  No orders of the defined types were placed in this encounter.  There are no Patient Instructions on file for this visit.   I,Mathew Stumpf,acting as a Education administrator for Freada Bergeron, MD.,have documented all relevant documentation on the behalf of Freada Bergeron, MD,as directed by  Freada Bergeron, MD while in  the presence of Freada Bergeron, MD.  ***  Signed, Madelin Rear  06/09/2022 10:26 AM    [place]

## 2022-06-10 ENCOUNTER — Ambulatory Visit: Payer: Medicare HMO | Admitting: Cardiology

## 2022-06-10 NOTE — Progress Notes (Signed)
Remote pacemaker transmission.   

## 2022-06-11 DIAGNOSIS — E039 Hypothyroidism, unspecified: Secondary | ICD-10-CM | POA: Diagnosis not present

## 2022-06-11 DIAGNOSIS — I10 Essential (primary) hypertension: Secondary | ICD-10-CM | POA: Diagnosis not present

## 2022-06-11 DIAGNOSIS — I2510 Atherosclerotic heart disease of native coronary artery without angina pectoris: Secondary | ICD-10-CM | POA: Diagnosis not present

## 2022-06-11 DIAGNOSIS — I5032 Chronic diastolic (congestive) heart failure: Secondary | ICD-10-CM | POA: Diagnosis not present

## 2022-06-11 DIAGNOSIS — E782 Mixed hyperlipidemia: Secondary | ICD-10-CM | POA: Diagnosis not present

## 2022-06-11 DIAGNOSIS — M8580 Other specified disorders of bone density and structure, unspecified site: Secondary | ICD-10-CM | POA: Diagnosis not present

## 2022-06-11 DIAGNOSIS — J4520 Mild intermittent asthma, uncomplicated: Secondary | ICD-10-CM | POA: Diagnosis not present

## 2022-06-19 DIAGNOSIS — J019 Acute sinusitis, unspecified: Secondary | ICD-10-CM | POA: Diagnosis not present

## 2022-06-19 DIAGNOSIS — M940 Chondrocostal junction syndrome [Tietze]: Secondary | ICD-10-CM | POA: Diagnosis not present

## 2022-06-19 DIAGNOSIS — Z6829 Body mass index (BMI) 29.0-29.9, adult: Secondary | ICD-10-CM | POA: Diagnosis not present

## 2022-06-19 DIAGNOSIS — I5032 Chronic diastolic (congestive) heart failure: Secondary | ICD-10-CM | POA: Diagnosis not present

## 2022-07-10 DIAGNOSIS — E039 Hypothyroidism, unspecified: Secondary | ICD-10-CM | POA: Diagnosis not present

## 2022-07-10 DIAGNOSIS — I5032 Chronic diastolic (congestive) heart failure: Secondary | ICD-10-CM | POA: Diagnosis not present

## 2022-07-10 DIAGNOSIS — I10 Essential (primary) hypertension: Secondary | ICD-10-CM | POA: Diagnosis not present

## 2022-07-10 DIAGNOSIS — J4520 Mild intermittent asthma, uncomplicated: Secondary | ICD-10-CM | POA: Diagnosis not present

## 2022-07-10 DIAGNOSIS — E782 Mixed hyperlipidemia: Secondary | ICD-10-CM | POA: Diagnosis not present

## 2022-07-10 DIAGNOSIS — I2510 Atherosclerotic heart disease of native coronary artery without angina pectoris: Secondary | ICD-10-CM | POA: Diagnosis not present

## 2022-07-12 NOTE — Progress Notes (Unsigned)
Cardiology Office Note:    Date:  07/16/2022   ID:  [name], DOB 1945/06/14, MRN [id number]  PCP:  Lawerance Cruel, [place] Providers Cardiologist:  [name], MD (Inactive) {  Referring MD: Lawerance Cruel, MD     History of Present Illness:    [name] is a 77 y.o. female with a hx of nonobstructive CAD, SSS s/p PPM placement, chronic diastolic HF, carotid artery Ca without significant stenosis, and aortic atherosclerosis who was previously followed by Dr. Tamala Julian with now returns to clinic for follow-up.  Was last seen in clinic by Dr. Caryl Comes in 06/2021 where she was doing well.  Today, the patient is still struggling with the grief after the loss of her husband. She otherwise feels well from a CV perspective. No SOB, palpitations, lightheadedness, dizziness or exertional chest pain. Blood pressure is well controlled. Tolerating medications as prescribed.   Past Medical History:  Diagnosis Date   Allergic rhinitis    Arthritis    Family history of breast cancer    Family history of colon cancer    Family history of pancreatic cancer    GERD (gastroesophageal reflux disease)    GI bleed    History of basal cell cancer 11/08/2008   RIGHT SUBAURICULAR   History of radiation therapy 08/05/2016 - 09/12/2016   Left Breast 50.4 Gy 28 fractions   Hyperlipidemia    Hypertension    Hypothyroidism    Lumbar disc disease    Malignant neoplasm of upper-outer quadrant of left female breast ([place]) 04/11/2016   Pacemaker    Peptic ulcer disease    SCC (squamous cell carcinoma) 09/09/2017   LEFT WRIST CX 3 5FU   Squamous cell carcinoma of skin 05/30/2013   LEFT LOWER BACK   SSS (sick sinus syndrome) ([place])    Thyroid disease     Past Surgical History:  Procedure Laterality Date   ABDOMINAL HYSTERECTOMY     BREAST LUMPECTOMY WITH RADIOACTIVE SEED AND SENTINEL LYMPH NODE BIOPSY Left 06/06/2016   Procedure: LEFT BREAST LUMPECTOMY WITH  RADIOACTIVE SEED AND LEFT AXILLARY SENTINEL LYMPH NODE BIOPSY;  Surgeon: Alphonsa Overall, MD;  Location: [facility];  Service: General;  Laterality: Left;   BREAST SURGERY     left   EP IMPLANTABLE DEVICE N/A 05/15/2016   Procedure: PPM Generator Changeout;  Surgeon: Evans Lance, MD;  Location: [facility] [facility];  Service: Cardiovascular;  Laterality: N/A;   HERNIA REPAIR     LEFT HEART CATH AND CORONARY ANGIOGRAPHY N/A 10/09/2017   Procedure: LEFT HEART CATH AND CORONARY ANGIOGRAPHY;  Surgeon: Belva Crome, MD;  Location: [facility];  Service: Cardiovascular;  Laterality: N/A;   PACEMAKER INSERTION      Current Medications: Current Meds  Medication Sig   aspirin EC 81 MG tablet Take 1 tablet (81 mg total) by mouth every Monday, Wednesday, and Friday. Swallow whole.   ezetimibe (ZETIA) 10 MG tablet TAKE ONE TABLET BY MOUTH EVERYDAY AT BEDTIME   fluticasone (FLONASE) 50 MCG/ACT nasal spray 2 spray in each nostril   levothyroxine (SYNTHROID) 75 MCG tablet Take 75 mcg by mouth daily.   methylPREDNISolone (MEDROL DOSEPAK) 4 MG TBPK tablet Take as directed until finished   MODERNA COVID-19 VACCINE 100 MCG/0.5ML injection    Omega-3 Fatty Acids (FISH OIL) 1000 MG CAPS Take 1,000 mg by mouth daily.   omeprazole (PRILOSEC) 40 MG capsule Take 40 mg by mouth  daily.   ondansetron (ZOFRAN) 4 MG tablet Take 1 tablet (4 mg total) by mouth every 8 (eight) hours as needed for nausea or vomiting.   triamterene-hydrochlorothiazide (MAXZIDE) 75-50 MG tablet Take 1 tablet by mouth every evening.    [DISCONTINUED] tamoxifen (NOLVADEX) 20 MG tablet TAKE ONE TABLET BY MOUTH EVERY MORNING   [DISCONTINUED] valACYclovir (VALTREX) 1000 MG tablet Take 1,000 mg by mouth 3 (three) times daily.     Allergies:   Bee venom, Morphine and related, Nsaids, Wasp venom, Penicillins, and Sulfa antibiotics   Social History   Socioeconomic History   Marital status: Married    Spouse name: Not on file   Number of  children: 2   Years of education: Not on file   Highest education level: Not on file  Occupational History   Occupation: house cleaner  Tobacco Use   Smoking status: Never   Smokeless tobacco: Never  Vaping Use   Vaping Use: Never used  Substance and Sexual Activity   Alcohol use: No   Drug use: No   Sexual activity: Never    Birth control/protection: Abstinence, None  Other Topics Concern   Not on file  Social History Narrative   Not on file   Social Determinants of Health   Financial Resource Strain: Not on file  Food Insecurity: Not on file  Transportation Needs: Not on file  Physical Activity: Not on file  Stress: Not on file  Social Connections: Not on file     Family History: The patient's family history includes Bone cancer in her father; Bradycardia in her cousin; Breast cancer (age of onset: 38) in her cousin and cousin; Breast cancer (age of onset: 36) in her sister; Colon cancer (age of onset: 23) in her cousin; Congestive Heart Failure in her mother; Heart attack in her maternal grandmother and paternal grandmother; Hypertension in her father; Melanoma in her daughter; Pancreatic cancer (age of onset: 29) in her paternal aunt; Rectal cancer (age of onset: 91) in her paternal aunt; Stroke in her paternal grandfather.  ROS:   Please see the history of present illness.     All other systems reviewed and are negative.  EKGs/Labs/Other Studies Reviewed:    The following studies were reviewed today: Cath 2019: 30 to 40% ostial left main.  There is ostial calcification noted as well. LAD wraps around the left ventricular apex.  It contains luminal irregularities from the mid to distal vessel obstructing up to 30%.  No high-grade focal stenosis is noted. Circumflex is dominant.  The PDA contains diffuse 60 to 70% proximal narrowing.  Irregularities are noted in the mid vessel. Nondominant right coronary without obstructive disease. Normal left ventricular systolic  function with EF 55 to 60%.  Elevated LVEDP of 21 millimeters mercury.  Findings are compatible with chronic diastolic heart failure.   RECOMMENDATIONS:   Aggressive risk factor modification including lipid-lowering LDL less than 70.  High blood pressure control 130/80 mmHg or less.  Adjust medications to lower LVEDP.  May need to add low-dose diuretic therapy.  EKG:  EKG is  ordered today.  The ekg ordered today demonstrates A-paced with HR 63bpm  Recent Labs: No results found for requested labs within last 365 days.  Recent Lipid Panel    Component Value Date/Time   CHOL 153 02/28/2020 1116   TRIG 181 (H) 02/28/2020 1116   HDL 48 02/28/2020 1116   CHOLHDL 3.2 02/28/2020 1116   LDLCALC 75 02/28/2020 1116     Risk  Assessment/Calculations:                Physical Exam:    VS:  BP 123/71   Pulse 63   Ht 5' 1"$  (1.549 m)   Wt 155 lb (70.3 kg)   SpO2 94%   BMI 29.29 kg/m     Wt Readings from Last 3 Encounters:  07/16/22 155 lb (70.3 kg)  01/08/22 147 lb (66.7 kg)  06/11/21 156 lb 3.2 oz (70.9 kg)     GEN:  Well nourished, well developed in no acute distress HEENT: Normal NECK: No JVD; No carotid bruits CARDIAC: RRR, 2/6 systolic murmur RESPIRATORY:  Clear to auscultation without rales, wheezing or rhonchi  ABDOMEN: Soft, non-tender, non-distended MUSCULOSKELETAL:  No edema; No deformity  SKIN: Warm and dry NEUROLOGIC:  Alert and oriented x 3 PSYCHIATRIC:  Normal affect   ASSESSMENT:    1. Coronary artery disease involving native coronary artery of native heart without angina pectoris   2. Hyperlipidemia, unspecified hyperlipidemia type   3. Medication management   4. Systolic murmur   5. Chronic diastolic heart failure ([place])   6. Primary hypertension   7. Sick sinus syndrome ([place])   8. History of permanent cardiac pacemaker placement   9. SSS (sick sinus syndrome) ([facility])    PLAN:    In order of problems listed above:  #CAD: LHC in 2019 with 30-40% LM,  60-70% PDA, EF 55-60%. Currently doing well without anginal symptoms. -Continue lipitor 75m daily, zetia 178mdaily -Continue ASA 8163maily  #SSS s/p PPM Placement: Follows with Dr. KleCaryl Comes#Chronic Diastolic HF: Currently euvolemic and compensated on exam with NYHA class I symptoms.  #HLD: #Aortic Atherosclerosis: -Continue lipitor 53m29mily, zetia 10mg32mly -Check lipids -Goal LDL <70  #HTN: Blood pressure very well controlled.  -Continue maxzide 75-5099991111y  #Systolic Murmur: -Check TTE for monitoring          Medication Adjustments/Labs and Tests Ordered: Current medicines are reviewed at length with the patient today.  Concerns regarding medicines are outlined above.  Orders Placed This Encounter  Procedures   Lipid Profile   EKG 12-Lead   ECHOCARDIOGRAM COMPLETE   No orders of the defined types were placed in this encounter.   Patient Instructions  Medication Instructions:   Your physician recommends that you continue on your current medications as directed. Please refer to the Current Medication list given to you today.  *If you need a refill on your cardiac medications before your next appointment, please call your pharmacy*   Lab Work:  ANYTIME [place]--CHECK LIPIDS--PLEASE COME FASTING TO THIS LAB APPOINTMENT  If you have labs (blood work) drawn today and your tests are completely normal, you will receive your results only by: [place] MyChart) OR A paper copy in the mail If you have any lab test that is abnormal or we need to change your treatment, we will call you to review the results.   Testing/Procedures:  Your physician has requested that you have an echocardiogram. Echocardiography is a painless test that uses sound waves to create images of your heart. It provides your doctor with information about the size and shape of your heart and how well your heart's chambers and valves are working. This  procedure takes approximately one hour. There are no restrictions for this procedure. Please do NOT wear cologne, perfume, aftershave, or lotions (deodorant is allowed). Please arrive 15 minutes prior to your appointment time.  Follow-Up: At [facility], you and your health needs are our priority.  As part of our continuing mission to provide you with exceptional heart care, we have created designated Provider Care Teams.  These Care Teams include your primary Cardiologist (physician) and Advanced Practice Providers (APPs -  Physician Assistants and Nurse Practitioners) who all work together to provide you with the care you need, when you need it.  We recommend signing up for the patient portal called "MyChart".  Sign up information is provided on this After Visit Summary.  MyChart is used to connect with patients for Virtual Visits (Telemedicine).  Patients are able to view lab/test results, encounter notes, upcoming appointments, etc.  Non-urgent messages can be sent to your provider as well.   To learn more about what you can do with MyChart, go to [email or web address].    Your next appointment:   1 year(s)  Provider:   DR. Johney Frame     Signed, Freada Bergeron, MD  07/16/2022 5:16 PM    [place]

## 2022-07-16 ENCOUNTER — Encounter: Payer: Self-pay | Admitting: Cardiology

## 2022-07-16 ENCOUNTER — Ambulatory Visit: Payer: Medicare HMO | Attending: Cardiology | Admitting: Cardiology

## 2022-07-16 VITALS — BP 123/71 | HR 63 | Ht 61.0 in | Wt 155.0 lb

## 2022-07-16 DIAGNOSIS — I495 Sick sinus syndrome: Secondary | ICD-10-CM

## 2022-07-16 DIAGNOSIS — I2510 Atherosclerotic heart disease of native coronary artery without angina pectoris: Secondary | ICD-10-CM | POA: Diagnosis not present

## 2022-07-16 DIAGNOSIS — R011 Cardiac murmur, unspecified: Secondary | ICD-10-CM

## 2022-07-16 DIAGNOSIS — Z950 Presence of cardiac pacemaker: Secondary | ICD-10-CM | POA: Diagnosis not present

## 2022-07-16 DIAGNOSIS — E785 Hyperlipidemia, unspecified: Secondary | ICD-10-CM | POA: Diagnosis not present

## 2022-07-16 DIAGNOSIS — I10 Essential (primary) hypertension: Secondary | ICD-10-CM

## 2022-07-16 DIAGNOSIS — I5032 Chronic diastolic (congestive) heart failure: Secondary | ICD-10-CM

## 2022-07-16 DIAGNOSIS — Z79899 Other long term (current) drug therapy: Secondary | ICD-10-CM

## 2022-07-16 NOTE — Patient Instructions (Signed)
Medication Instructions:   Your physician recommends that you continue on your current medications as directed. Please refer to the Current Medication list given to you today.  *If you need a refill on your cardiac medications before your next appointment, please call your pharmacy*   Lab Work:  ANYTIME [place] OFFICE--CHECK LIPIDS--PLEASE COME FASTING TO THIS LAB APPOINTMENT  If you have labs (blood work) drawn today and your tests are completely normal, you will receive your results only by: [place] (if you have MyChart) OR A paper copy in the mail If you have any lab test that is abnormal or we need to change your treatment, we will call you to review the results.   Testing/Procedures:  Your physician has requested that you have an echocardiogram. Echocardiography is a painless test that uses sound waves to create images of your heart. It provides your doctor with information about the size and shape of your heart and how well your heart's chambers and valves are working. This procedure takes approximately one hour. There are no restrictions for this procedure. Please do NOT wear cologne, perfume, aftershave, or lotions (deodorant is allowed). Please arrive 15 minutes prior to your appointment time.     Follow-Up: At [facility], you and your health needs are our priority.  As part of our continuing mission to provide you with exceptional heart care, we have created designated Provider Care Teams.  These Care Teams include your primary Cardiologist (physician) and Advanced Practice Providers (APPs -  Physician Assistants and Nurse Practitioners) who all work together to provide you with the care you need, when you need it.  We recommend signing up for the patient portal called "MyChart".  Sign up information is provided on this After Visit Summary.  MyChart is used to connect with patients for Virtual Visits (Telemedicine).  Patients are able to view  lab/test results, encounter notes, upcoming appointments, etc.  Non-urgent messages can be sent to your provider as well.   To learn more about what you can do with MyChart, go to [email or web address].    Your next appointment:   1 year(s)  Provider:   DR. Johney Frame

## 2022-08-06 ENCOUNTER — Other Ambulatory Visit: Payer: Medicare HMO

## 2022-08-11 ENCOUNTER — Ambulatory Visit (HOSPITAL_COMMUNITY): Payer: Medicare HMO | Attending: Cardiovascular Disease

## 2022-08-11 DIAGNOSIS — R011 Cardiac murmur, unspecified: Secondary | ICD-10-CM | POA: Insufficient documentation

## 2022-08-12 ENCOUNTER — Other Ambulatory Visit: Payer: Medicare HMO

## 2022-08-12 LAB — ECHOCARDIOGRAM COMPLETE
Area-P 1/2: 3.03 cm2
S' Lateral: 4.2 cm

## 2022-08-13 ENCOUNTER — Telehealth: Payer: Self-pay | Admitting: *Deleted

## 2022-08-13 ENCOUNTER — Other Ambulatory Visit: Payer: Self-pay | Admitting: *Deleted

## 2022-08-13 DIAGNOSIS — I2510 Atherosclerotic heart disease of native coronary artery without angina pectoris: Secondary | ICD-10-CM

## 2022-08-13 DIAGNOSIS — E785 Hyperlipidemia, unspecified: Secondary | ICD-10-CM

## 2022-08-13 DIAGNOSIS — I77819 Aortic ectasia, unspecified site: Secondary | ICD-10-CM

## 2022-08-13 DIAGNOSIS — Z950 Presence of cardiac pacemaker: Secondary | ICD-10-CM

## 2022-08-13 DIAGNOSIS — Z79899 Other long term (current) drug therapy: Secondary | ICD-10-CM

## 2022-08-13 NOTE — Telephone Encounter (Signed)
The patient has been notified of the result and verbalized understanding.  All questions (if any) were answered.  Pt aware that she will need a repeat echo in one year for surveillance of dilated aorta.   Pt aware that I will  place the order for this in the system and send a message to our Echo Scheduler to call her back to arrange this appt for that time.   Pt verbalized understanding and agrees with this plan.

## 2022-08-13 NOTE — Telephone Encounter (Signed)
-----   Message from Freada Bergeron, MD sent at 08/12/2022  8:40 PM EDT ----- Her ultrasound shows low normal pumping function and no significant valve disease. Her septum moves a little slowly due to the presence of a pacemaker which is to be expected. 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

## 2022-08-20 ENCOUNTER — Ambulatory Visit: Payer: Medicare HMO | Attending: Cardiology

## 2022-08-20 DIAGNOSIS — I2510 Atherosclerotic heart disease of native coronary artery without angina pectoris: Secondary | ICD-10-CM | POA: Diagnosis not present

## 2022-08-20 DIAGNOSIS — E785 Hyperlipidemia, unspecified: Secondary | ICD-10-CM | POA: Diagnosis not present

## 2022-08-20 DIAGNOSIS — Z79899 Other long term (current) drug therapy: Secondary | ICD-10-CM

## 2022-08-21 LAB — LIPID PANEL
Chol/HDL Ratio: 2.6 ratio (ref 0.0–4.4)
Cholesterol, Total: 161 mg/dL (ref 100–199)
HDL: 62 mg/dL (ref >39)
LDL Chol Calc (NIH): 78 mg/dL (ref 0–99)
Triglycerides: 117 mg/dL (ref 0–149)
VLDL Cholesterol Cal: 21 mg/dL (ref 5–40)

## 2022-08-22 DIAGNOSIS — R059 Cough, unspecified: Secondary | ICD-10-CM | POA: Diagnosis not present

## 2022-08-22 DIAGNOSIS — J45909 Unspecified asthma, uncomplicated: Secondary | ICD-10-CM | POA: Diagnosis not present

## 2022-08-22 DIAGNOSIS — T7840XA Allergy, unspecified, initial encounter: Secondary | ICD-10-CM | POA: Diagnosis not present

## 2022-09-10 DIAGNOSIS — I5032 Chronic diastolic (congestive) heart failure: Secondary | ICD-10-CM | POA: Diagnosis not present

## 2022-09-10 DIAGNOSIS — I10 Essential (primary) hypertension: Secondary | ICD-10-CM | POA: Diagnosis not present

## 2022-09-10 DIAGNOSIS — M8580 Other specified disorders of bone density and structure, unspecified site: Secondary | ICD-10-CM | POA: Diagnosis not present

## 2022-09-10 DIAGNOSIS — E039 Hypothyroidism, unspecified: Secondary | ICD-10-CM | POA: Diagnosis not present

## 2022-09-10 DIAGNOSIS — J4520 Mild intermittent asthma, uncomplicated: Secondary | ICD-10-CM | POA: Diagnosis not present

## 2022-09-10 DIAGNOSIS — E782 Mixed hyperlipidemia: Secondary | ICD-10-CM | POA: Diagnosis not present

## 2022-09-29 DIAGNOSIS — S30861A Insect bite (nonvenomous) of abdominal wall, initial encounter: Secondary | ICD-10-CM | POA: Diagnosis not present

## 2022-09-29 DIAGNOSIS — I700 Atherosclerosis of aorta: Secondary | ICD-10-CM | POA: Diagnosis not present

## 2022-09-29 DIAGNOSIS — W57XXXA Bitten or stung by nonvenomous insect and other nonvenomous arthropods, initial encounter: Secondary | ICD-10-CM | POA: Diagnosis not present

## 2022-09-29 DIAGNOSIS — J019 Acute sinusitis, unspecified: Secondary | ICD-10-CM | POA: Diagnosis not present

## 2022-09-29 DIAGNOSIS — Z6828 Body mass index (BMI) 28.0-28.9, adult: Secondary | ICD-10-CM | POA: Diagnosis not present

## 2022-12-29 ENCOUNTER — Telehealth: Payer: Self-pay | Admitting: Internal Medicine

## 2022-12-29 NOTE — Telephone Encounter (Signed)
Patient is calling because she is having issues with her device transmitting. Patient is requesting help from the device team.

## 2023-01-01 ENCOUNTER — Ambulatory Visit (INDEPENDENT_AMBULATORY_CARE_PROVIDER_SITE_OTHER): Payer: Medicare HMO

## 2023-01-01 DIAGNOSIS — I495 Sick sinus syndrome: Secondary | ICD-10-CM | POA: Diagnosis not present

## 2023-01-01 NOTE — Telephone Encounter (Signed)
I left detailed message on patient voicemail leaving device clinic number and [employer] number to get help with monitor.

## 2023-01-01 NOTE — Telephone Encounter (Signed)
I called tech support with the patient and transmission received 01/01/2023.

## 2023-01-02 NOTE — Telephone Encounter (Signed)
Spoke with Pt.  Advised transmission received.  Normal device function.  Advised she would get a call from scheduler to set up overdue follow up appt.  Pt thanked nurse for call back.

## 2023-01-14 NOTE — Progress Notes (Signed)
Remote pacemaker transmission.   

## 2023-01-28 ENCOUNTER — Other Ambulatory Visit (HOSPITAL_COMMUNITY): Payer: Self-pay

## 2023-01-29 ENCOUNTER — Other Ambulatory Visit (HOSPITAL_COMMUNITY): Payer: Self-pay

## 2023-01-29 MED ORDER — LEVOTHYROXINE SODIUM 75 MCG PO TABS
75.0000 ug | ORAL_TABLET | Freq: Every day | ORAL | 4 refills | Status: DC
  Filled 2023-02-25: qty 30, 30d supply, fill #0

## 2023-01-29 MED ORDER — OMEPRAZOLE 40 MG PO CPDR
40.0000 mg | DELAYED_RELEASE_CAPSULE | Freq: Every morning | ORAL | 4 refills | Status: DC
  Filled 2023-02-25: qty 30, 30d supply, fill #0

## 2023-01-29 MED ORDER — ATORVASTATIN CALCIUM 80 MG PO TABS
80.0000 mg | ORAL_TABLET | Freq: Every evening | ORAL | 4 refills | Status: DC
  Filled 2023-02-25: qty 30, 30d supply, fill #0

## 2023-01-29 MED ORDER — EZETIMIBE 10 MG PO TABS
10.0000 mg | ORAL_TABLET | Freq: Every day | ORAL | 4 refills | Status: DC
  Filled 2023-02-25: qty 30, 30d supply, fill #0

## 2023-01-29 MED ORDER — TRIAMTERENE-HCTZ 75-50 MG PO TABS
1.0000 | ORAL_TABLET | Freq: Every day | ORAL | 4 refills | Status: DC
  Filled 2023-02-25: qty 30, 30d supply, fill #0

## 2023-01-30 ENCOUNTER — Other Ambulatory Visit (HOSPITAL_COMMUNITY): Payer: Self-pay

## 2023-02-03 ENCOUNTER — Other Ambulatory Visit: Payer: Self-pay

## 2023-02-07 ENCOUNTER — Other Ambulatory Visit: Payer: Self-pay

## 2023-02-20 ENCOUNTER — Other Ambulatory Visit (HOSPITAL_COMMUNITY): Payer: Self-pay

## 2023-02-25 ENCOUNTER — Other Ambulatory Visit: Payer: Self-pay

## 2023-02-26 ENCOUNTER — Other Ambulatory Visit: Payer: Self-pay

## 2023-03-16 DIAGNOSIS — R300 Dysuria: Secondary | ICD-10-CM | POA: Diagnosis not present

## 2023-03-16 DIAGNOSIS — R81 Glycosuria: Secondary | ICD-10-CM | POA: Diagnosis not present

## 2023-03-16 DIAGNOSIS — R3589 Other polyuria: Secondary | ICD-10-CM | POA: Diagnosis not present

## 2023-03-23 ENCOUNTER — Other Ambulatory Visit: Payer: Self-pay

## 2023-03-23 ENCOUNTER — Other Ambulatory Visit (HOSPITAL_COMMUNITY): Payer: Self-pay

## 2023-03-23 MED ORDER — OMEPRAZOLE 40 MG PO CPDR
40.0000 mg | DELAYED_RELEASE_CAPSULE | Freq: Every morning | ORAL | 0 refills | Status: DC
Start: 2023-03-23 — End: 2023-04-08
  Filled 2023-03-23: qty 30, 30d supply, fill #0

## 2023-03-23 MED ORDER — TRIAMTERENE-HCTZ 75-50 MG PO TABS
1.0000 | ORAL_TABLET | Freq: Every day | ORAL | 0 refills | Status: DC
  Filled 2023-03-23: qty 30, 30d supply, fill #0

## 2023-03-23 MED ORDER — EZETIMIBE 10 MG PO TABS
10.0000 mg | ORAL_TABLET | Freq: Every day | ORAL | 0 refills | Status: DC
  Filled 2023-03-23: qty 30, 30d supply, fill #0

## 2023-03-23 MED ORDER — ATORVASTATIN CALCIUM 80 MG PO TABS
80.0000 mg | ORAL_TABLET | Freq: Every day | ORAL | 0 refills | Status: DC
  Filled 2023-03-23: qty 30, 30d supply, fill #0

## 2023-03-23 MED ORDER — LEVOTHYROXINE SODIUM 75 MCG PO TABS
75.0000 ug | ORAL_TABLET | Freq: Every day | ORAL | 0 refills | Status: DC
  Filled 2023-03-23: qty 30, 30d supply, fill #0

## 2023-03-24 ENCOUNTER — Other Ambulatory Visit: Payer: Self-pay

## 2023-03-24 ENCOUNTER — Other Ambulatory Visit (HOSPITAL_COMMUNITY): Payer: Self-pay

## 2023-04-02 ENCOUNTER — Ambulatory Visit (INDEPENDENT_AMBULATORY_CARE_PROVIDER_SITE_OTHER): Payer: Medicare HMO

## 2023-04-02 DIAGNOSIS — I495 Sick sinus syndrome: Secondary | ICD-10-CM | POA: Diagnosis not present

## 2023-04-03 ENCOUNTER — Other Ambulatory Visit (HOSPITAL_COMMUNITY): Payer: Self-pay

## 2023-04-03 DIAGNOSIS — I5032 Chronic diastolic (congestive) heart failure: Secondary | ICD-10-CM | POA: Diagnosis not present

## 2023-04-03 DIAGNOSIS — R7303 Prediabetes: Secondary | ICD-10-CM | POA: Diagnosis not present

## 2023-04-03 DIAGNOSIS — Z6828 Body mass index (BMI) 28.0-28.9, adult: Secondary | ICD-10-CM | POA: Diagnosis not present

## 2023-04-03 DIAGNOSIS — J4520 Mild intermittent asthma, uncomplicated: Secondary | ICD-10-CM | POA: Diagnosis not present

## 2023-04-03 DIAGNOSIS — I10 Essential (primary) hypertension: Secondary | ICD-10-CM | POA: Diagnosis not present

## 2023-04-03 DIAGNOSIS — I495 Sick sinus syndrome: Secondary | ICD-10-CM | POA: Diagnosis not present

## 2023-04-03 DIAGNOSIS — L989 Disorder of the skin and subcutaneous tissue, unspecified: Secondary | ICD-10-CM | POA: Diagnosis not present

## 2023-04-03 DIAGNOSIS — I700 Atherosclerosis of aorta: Secondary | ICD-10-CM | POA: Diagnosis not present

## 2023-04-03 DIAGNOSIS — E782 Mixed hyperlipidemia: Secondary | ICD-10-CM | POA: Diagnosis not present

## 2023-04-03 DIAGNOSIS — E039 Hypothyroidism, unspecified: Secondary | ICD-10-CM | POA: Diagnosis not present

## 2023-04-03 DIAGNOSIS — Z0000 Encounter for general adult medical examination without abnormal findings: Secondary | ICD-10-CM | POA: Diagnosis not present

## 2023-04-03 DIAGNOSIS — Z23 Encounter for immunization: Secondary | ICD-10-CM | POA: Diagnosis not present

## 2023-04-03 MED ORDER — EZETIMIBE 10 MG PO TABS
10.0000 mg | ORAL_TABLET | Freq: Every day | ORAL | 4 refills | Status: DC
Start: 2023-04-03 — End: 2024-04-11
  Filled 2023-04-03 – 2023-04-10 (×2): qty 90, 90d supply, fill #0
  Filled 2023-04-15: qty 30, 30d supply, fill #0
  Filled 2023-05-13 – 2023-05-14 (×2): qty 30, 30d supply, fill #1
  Filled 2023-06-01 – 2023-06-09 (×4): qty 30, 30d supply, fill #2
  Filled 2023-06-29 – 2023-07-02 (×2): qty 30, 30d supply, fill #3
  Filled 2023-07-16 – 2023-07-29 (×3): qty 30, 30d supply, fill #4
  Filled 2023-08-06 – 2023-08-24 (×3): qty 30, 30d supply, fill #5
  Filled 2023-09-16 – 2023-09-17 (×2): qty 30, 30d supply, fill #6
  Filled 2023-10-05 – 2023-10-20 (×3): qty 30, 30d supply, fill #7
  Filled 2023-11-04 – 2023-11-12 (×2): qty 30, 30d supply, fill #8
  Filled 2023-12-09: qty 30, 30d supply, fill #9
  Filled 2024-01-12: qty 30, 30d supply, fill #10
  Filled 2024-02-11 – 2024-02-16 (×2): qty 30, 30d supply, fill #11
  Filled 2024-03-09 – 2024-03-10 (×2): qty 30, 30d supply, fill #12

## 2023-04-03 MED ORDER — LEVOTHYROXINE SODIUM 75 MCG PO TABS
75.0000 ug | ORAL_TABLET | Freq: Every morning | ORAL | 4 refills | Status: DC
  Filled 2023-04-03 – 2023-04-10 (×2): qty 90, 90d supply, fill #0
  Filled 2023-04-15: qty 30, 30d supply, fill #0
  Filled 2023-05-13 – 2023-05-14 (×2): qty 30, 30d supply, fill #1
  Filled 2023-06-01 – 2023-06-09 (×4): qty 30, 30d supply, fill #2
  Filled 2023-06-29 – 2023-07-02 (×2): qty 30, 30d supply, fill #3
  Filled 2023-07-16 – 2023-07-29 (×3): qty 30, 30d supply, fill #4
  Filled 2023-08-06 – 2023-08-24 (×3): qty 30, 30d supply, fill #5
  Filled 2023-09-16 – 2023-09-17 (×2): qty 30, 30d supply, fill #6
  Filled 2023-10-05 – 2023-10-20 (×3): qty 30, 30d supply, fill #7
  Filled 2023-11-04 – 2023-11-12 (×2): qty 30, 30d supply, fill #8
  Filled 2023-12-09: qty 30, 30d supply, fill #9
  Filled 2024-01-12: qty 30, 30d supply, fill #10
  Filled 2024-02-11 – 2024-02-16 (×2): qty 30, 30d supply, fill #11
  Filled 2024-03-09 – 2024-03-10 (×2): qty 30, 30d supply, fill #12

## 2023-04-03 MED ORDER — ALBUTEROL SULFATE (2.5 MG/3ML) 0.083% IN NEBU
3.0000 mL | INHALATION_SOLUTION | Freq: Four times a day (QID) | RESPIRATORY_TRACT | 3 refills | Status: AC | PRN
Start: 2023-04-03 — End: ?
  Filled 2023-04-03: qty 360, 30d supply, fill #0

## 2023-04-03 MED ORDER — TRIAMTERENE-HCTZ 75-50 MG PO TABS
1.0000 | ORAL_TABLET | Freq: Every morning | ORAL | 4 refills | Status: DC
  Filled 2023-04-03 – 2023-04-10 (×2): qty 90, 90d supply, fill #0
  Filled 2023-04-15: qty 30, 30d supply, fill #0
  Filled 2023-05-13 – 2023-05-14 (×2): qty 30, 30d supply, fill #1
  Filled 2023-06-01 – 2023-06-09 (×4): qty 30, 30d supply, fill #2
  Filled 2023-06-29 – 2023-07-02 (×2): qty 30, 30d supply, fill #3
  Filled 2023-07-16 – 2023-07-29 (×3): qty 30, 30d supply, fill #4
  Filled 2023-08-06 – 2023-08-24 (×2): qty 30, 30d supply, fill #5

## 2023-04-03 MED ORDER — OMEPRAZOLE 40 MG PO CPDR
40.0000 mg | DELAYED_RELEASE_CAPSULE | Freq: Every day | ORAL | 4 refills | Status: DC
  Filled 2023-04-03 – 2023-04-10 (×2): qty 90, 90d supply, fill #0
  Filled 2023-04-15: qty 30, 30d supply, fill #0
  Filled 2023-05-13 – 2023-05-14 (×2): qty 30, 30d supply, fill #1
  Filled 2023-06-01 – 2023-06-09 (×4): qty 30, 30d supply, fill #2
  Filled 2023-06-29 – 2023-07-02 (×2): qty 30, 30d supply, fill #3
  Filled 2023-07-16 – 2023-07-29 (×3): qty 30, 30d supply, fill #4
  Filled 2023-08-06 – 2023-08-24 (×3): qty 30, 30d supply, fill #5
  Filled 2023-09-16 – 2023-09-17 (×2): qty 30, 30d supply, fill #6
  Filled 2023-10-05 – 2023-10-20 (×3): qty 30, 30d supply, fill #7
  Filled 2023-11-04 – 2023-11-12 (×2): qty 30, 30d supply, fill #8
  Filled 2023-12-09: qty 30, 30d supply, fill #9
  Filled 2024-01-12: qty 30, 30d supply, fill #10
  Filled 2024-02-11 – 2024-02-16 (×2): qty 30, 30d supply, fill #11
  Filled 2024-03-09 – 2024-03-10 (×2): qty 30, 30d supply, fill #12

## 2023-04-03 MED ORDER — ATORVASTATIN CALCIUM 80 MG PO TABS
80.0000 mg | ORAL_TABLET | Freq: Every day | ORAL | 4 refills | Status: DC
Start: 2023-04-03 — End: 2024-04-11
  Filled 2023-04-03 – 2023-04-10 (×2): qty 90, 90d supply, fill #0
  Filled 2023-04-15: qty 30, 30d supply, fill #0
  Filled 2023-05-13 – 2023-05-14 (×2): qty 30, 30d supply, fill #1
  Filled 2023-06-01 – 2023-06-09 (×4): qty 30, 30d supply, fill #2
  Filled 2023-06-29 – 2023-07-02 (×2): qty 30, 30d supply, fill #3
  Filled 2023-07-16 – 2023-07-29 (×3): qty 30, 30d supply, fill #4
  Filled 2023-08-06 – 2023-08-24 (×3): qty 30, 30d supply, fill #5
  Filled 2023-09-16 – 2023-09-17 (×2): qty 30, 30d supply, fill #6
  Filled 2023-10-05 – 2023-10-20 (×3): qty 30, 30d supply, fill #7
  Filled 2023-11-04 – 2023-11-12 (×2): qty 30, 30d supply, fill #8
  Filled 2023-12-09: qty 30, 30d supply, fill #9
  Filled 2024-01-12: qty 30, 30d supply, fill #10
  Filled 2024-02-11 – 2024-02-16 (×2): qty 30, 30d supply, fill #11
  Filled 2024-03-09 – 2024-03-10 (×2): qty 30, 30d supply, fill #12

## 2023-04-03 MED ORDER — ALBUTEROL SULFATE HFA 108 (90 BASE) MCG/ACT IN AERS
2.0000 | INHALATION_SPRAY | RESPIRATORY_TRACT | 5 refills | Status: AC | PRN
Start: 2023-04-03 — End: ?
  Filled 2023-04-03: qty 6.7, 17d supply, fill #0

## 2023-04-05 LAB — CUP PACEART REMOTE DEVICE CHECK
Battery Impedance: 1067 Ohm
Battery Remaining Longevity: 43 mo
Battery Voltage: 2.75 V
Brady Statistic AP VP Percent: 1 %
Brady Statistic AP VS Percent: 83 %
Brady Statistic AS VP Percent: 0 %
Brady Statistic AS VS Percent: 16 %
Date Time Interrogation Session: 20241101135030
Implantable Lead Connection Status: 753985
Implantable Lead Connection Status: 753985
Implantable Lead Implant Date: 19970926
Implantable Lead Implant Date: 19970926
Implantable Lead Location: 753859
Implantable Lead Location: 753860
Implantable Lead Model: 4024
Implantable Lead Model: 4524
Implantable Pulse Generator Implant Date: 20171214
Lead Channel Impedance Value: 322 Ohm
Lead Channel Impedance Value: 892 Ohm
Lead Channel Pacing Threshold Amplitude: 0.875 V
Lead Channel Pacing Threshold Amplitude: 1.5 V
Lead Channel Pacing Threshold Pulse Width: 0.4 ms
Lead Channel Pacing Threshold Pulse Width: 0.4 ms
Lead Channel Setting Pacing Amplitude: 2.5 V
Lead Channel Setting Pacing Amplitude: 2.5 V
Lead Channel Setting Pacing Pulse Width: 0.4 ms
Lead Channel Setting Sensing Sensitivity: 5.6 mV
Zone Setting Status: 755011
Zone Setting Status: 755011

## 2023-04-08 ENCOUNTER — Other Ambulatory Visit (HOSPITAL_COMMUNITY): Payer: Self-pay

## 2023-04-08 ENCOUNTER — Ambulatory Visit: Payer: Medicare HMO | Attending: Internal Medicine | Admitting: Internal Medicine

## 2023-04-08 ENCOUNTER — Encounter: Payer: Self-pay | Admitting: Internal Medicine

## 2023-04-08 VITALS — BP 110/66 | HR 64 | Ht 61.0 in | Wt 151.4 lb

## 2023-04-08 DIAGNOSIS — I495 Sick sinus syndrome: Secondary | ICD-10-CM | POA: Diagnosis not present

## 2023-04-08 DIAGNOSIS — I447 Left bundle-branch block, unspecified: Secondary | ICD-10-CM

## 2023-04-08 DIAGNOSIS — Z950 Presence of cardiac pacemaker: Secondary | ICD-10-CM

## 2023-04-08 NOTE — Progress Notes (Signed)
Patient Care Team: Daisy Floro, MD as PCP - General (Family Medicine) Lyn Records, MD (Inactive) as PCP - Cardiology (Cardiology) Magrinat, Valentino Hue, MD (Inactive) as Consulting Physician (Oncology) Antony Blackbird, MD as Consulting Physician (Radiation Oncology) Ovidio Kin, MD as Consulting Physician (General Surgery) Dorena Cookey, MD (Inactive) as Consulting Physician (Gastroenterology) Axel Filler Larna Daughters, NP as Nurse Practitioner (Hematology and Oncology) Duke Salvia, MD as Consulting Physician (Cardiology) Janalyn Harder, MD (Inactive) as Consulting Physician (Dermatology)   HPI  [name] is a 77 y.o. female Last seen in follow-up for a pacemaker implanted previously on the left side and then moved to the right side to get out of the way of radiation therapy. Device implant originally 1997/05/12, gen change 2007-05-13 and 2023-06-18  Husband died last 2023/06/12.  She is doing pretty well.  They were married 34 years.  The patient denies chest pain, shortness of breath, nocturnal dyspnea, orthopnea or peripheral edema.  There have been no palpitations, lightheadedness or syncope.      DATE TEST EF   8/16 Myoview    No ischemia  4/19 Echo   65 %   5/19 LHC 55-60% LM 30-40; PDA 60-70d  3/24 Echo   50-55       Date Cr K Hgb LDL  8/19 0.69 4.5  87  9/21    75   12/22 0.78 3.7 13.4          Past Medical History:  Diagnosis Date   Allergic rhinitis    Arthritis    Family history of breast cancer    Family history of colon cancer    Family history of pancreatic cancer    GERD (gastroesophageal reflux disease)    GI bleed    History of basal cell cancer 11/08/2008   RIGHT SUBAURICULAR   History of radiation therapy 08/05/2016 - 09/12/2016   Left Breast 50.4 Gy 28 fractions   Hyperlipidemia    Hypertension    Hypothyroidism    Lumbar disc disease    Malignant neoplasm of upper-outer quadrant of left female breast (HCC) 04/11/2016   Pacemaker     Peptic ulcer disease    SCC (squamous cell carcinoma) 09/09/2017   LEFT WRIST CX 3 5FU   Squamous cell carcinoma of skin 05/30/2013   LEFT LOWER BACK   SSS (sick sinus syndrome) (HCC)    Thyroid disease     Past Surgical History:  Procedure Laterality Date   ABDOMINAL HYSTERECTOMY     BREAST LUMPECTOMY WITH RADIOACTIVE SEED AND SENTINEL LYMPH NODE BIOPSY Left 06/06/2016   Procedure: LEFT BREAST LUMPECTOMY WITH RADIOACTIVE SEED AND LEFT AXILLARY SENTINEL LYMPH NODE BIOPSY;  Surgeon: Ovidio Kin, MD;  Location: [facility] OR;  Service: General;  Laterality: Left;   BREAST SURGERY     left   EP IMPLANTABLE DEVICE N/A 05/15/2016   Procedure: PPM Generator Changeout;  Surgeon: Marinus Maw, MD;  Location: [facility] INVASIVE CV LAB;  Service: Cardiovascular;  Laterality: N/A;   HERNIA REPAIR     LEFT HEART CATH AND CORONARY ANGIOGRAPHY N/A 10/09/2017   Procedure: LEFT HEART CATH AND CORONARY ANGIOGRAPHY;  Surgeon: Lyn Records, MD;  Location: [facility] INVASIVE CV LAB;  Service: Cardiovascular;  Laterality: N/A;   PACEMAKER INSERTION      Current Outpatient Medications  Medication Sig Dispense Refill   albuterol (PROVENTIL) (2.5 MG/3ML) 0.083% nebulizer solution Take 3 mLs by nebulization every 6 (six) hours as needed. 360 mL 3  albuterol (VENTOLIN HFA) 108 (90 Base) MCG/ACT inhaler Inhale 2 puffs into the lungs every 4 (four) hours as needed. 6.7 g 5   aspirin EC 81 MG tablet Take 1 tablet (81 mg total) by mouth every Monday, Wednesday, and Friday. Swallow whole. 45 tablet 3   atorvastatin (LIPITOR) 80 MG tablet Take 1 tablet (80 mg total) by mouth daily. 90 tablet 4   ezetimibe (ZETIA) 10 MG tablet Take 1 tablet (10 mg total) by mouth daily. 90 tablet 4   fluticasone (FLONASE) 50 MCG/ACT nasal spray 2 spray in each nostril     levothyroxine (SYNTHROID) 75 MCG tablet Take 1 tablet (75 mcg total) by mouth in the morning on an empty stomach. 90 tablet 4   MODERNA COVID-19 VACCINE 100 MCG/0.5ML injection       Omega-3 Fatty Acids (FISH OIL) 1000 MG CAPS Take 1,000 mg by mouth daily.     omeprazole (PRILOSEC) 40 MG capsule Take 1 capsule (40 mg total) by mouth daily or every other day. 90 capsule 4   ondansetron (ZOFRAN) 4 MG tablet Take 1 tablet (4 mg total) by mouth every 8 (eight) hours as needed for nausea or vomiting. 15 tablet 0   triamterene-hydrochlorothiazide (MAXZIDE) 75-50 MG tablet Take 1 tablet by mouth in the morning. 90 tablet 4   No current facility-administered medications for this visit.    Allergies  Allergen Reactions   Bee Venom Shortness Of Breath and Itching   Morphine And Codeine Shortness Of Breath and Itching   Nsaids Other (See Comments)    Upper GI bleed   Wasp Venom Shortness Of Breath and Itching   Penicillins Itching and Other (See Comments)    Has patient had a PCN reaction causing immediate rash, facial/tongue/throat swelling, SOB or lightheadedness with hypotension:No Has patient had a PCN reaction causing severe rash involving mucus membranes or skin necrosis:No Has patient had a PCN reaction that required hospitalization:No Has patient had a PCN reaction occurring within the last 10 years:No If all of the above answers are "NO", then may proceed with Cephalosporin use.    Sulfa Antibiotics Itching    Review of Systems negative except from HPI and PMH Please see the history of present illness. (+)   All other systems are reviewed and negative.    Physical Exam BP 110/66   Pulse 64   Ht 5\' 1"  (1.549 m)   Wt 151 lb 6.4 oz (68.7 kg)   SpO2 96%   BMI 28.61 kg/m  Well developed and well nourished in no acute distress HENT normal Neck supple with JVP-flat Clear Device pocket well healed; without hematoma or erythema.  There is no tethering  Regular rate and rhythm, no murmur Abd-soft with active BS No Clubbing cyanosis  edema Skin-warm and dry A & Oriented  Grossly normal sensory and motor function  ECG atrial pacing at 64 Intervals  16/15/46  Device function is normal. Programming changes none  See Paceart for details       Assessment and  Plan  Sinus node dysfunction  Left bundle branch block  Breast Cancer   Hypertension  Pacemaker [employer]  L sided translocated to R for  XRT   Doing well  with BP low, will have her stop maxzide  Seh will let us know if accumulates edema

## 2023-04-08 NOTE — Patient Instructions (Addendum)
Medication Instructions:  Your physician recommends that you continue on your current medications as directed. Please refer to the Current Medication list given to you today.  *If you need a refill on your cardiac medications before your next appointment, please call your pharmacy*   Lab Work: None ordered.  If you have labs (blood work) drawn today and your tests are completely normal, you will receive your results only by: MyChart Message (if you have MyChart) OR A paper copy in the mail If you have any lab test that is abnormal or we need to change your treatment, we will call you to review the results.   Testing/Procedures: None ordered.    Follow-Up: At [facility], you and your health needs are our priority.  As part of our continuing mission to provide you with exceptional heart care, we have created designated Provider Care Teams.  These Care Teams include your primary Cardiologist (physician) and Advanced Practice Providers (APPs -  Physician Assistants and Nurse Practitioners) who all work together to provide you with the care you need, when you need it.  We recommend signing up for the patient portal called "MyChart".  Sign up information is provided on this After Visit Summary.  MyChart is used to connect with patients for Virtual Visits (Telemedicine).  Patients are able to view lab/test results, encounter notes, upcoming appointments, etc.  Non-urgent messages can be sent to your provider as well.   To learn more about what you can do with MyChart, go to [email or web address].    Your next appointment:   12 months with Dr Graciela Husbands  Other Instructions

## 2023-04-09 ENCOUNTER — Other Ambulatory Visit: Payer: Self-pay

## 2023-04-09 ENCOUNTER — Other Ambulatory Visit (HOSPITAL_COMMUNITY): Payer: Self-pay

## 2023-04-10 ENCOUNTER — Other Ambulatory Visit: Payer: Self-pay

## 2023-04-15 ENCOUNTER — Other Ambulatory Visit: Payer: Self-pay

## 2023-04-15 ENCOUNTER — Other Ambulatory Visit (HOSPITAL_COMMUNITY): Payer: Self-pay

## 2023-04-16 NOTE — Progress Notes (Signed)
Remote pacemaker transmission.   

## 2023-04-28 ENCOUNTER — Other Ambulatory Visit: Payer: Self-pay

## 2023-05-11 DIAGNOSIS — Z1231 Encounter for screening mammogram for malignant neoplasm of breast: Secondary | ICD-10-CM | POA: Diagnosis not present

## 2023-05-13 ENCOUNTER — Other Ambulatory Visit: Payer: Self-pay

## 2023-05-14 ENCOUNTER — Other Ambulatory Visit: Payer: Self-pay

## 2023-06-01 ENCOUNTER — Other Ambulatory Visit (HOSPITAL_COMMUNITY): Payer: Self-pay

## 2023-06-04 ENCOUNTER — Other Ambulatory Visit (HOSPITAL_COMMUNITY): Payer: Self-pay

## 2023-06-05 ENCOUNTER — Other Ambulatory Visit: Payer: Self-pay

## 2023-06-09 ENCOUNTER — Other Ambulatory Visit: Payer: Self-pay

## 2023-06-25 DIAGNOSIS — J3489 Other specified disorders of nose and nasal sinuses: Secondary | ICD-10-CM | POA: Diagnosis not present

## 2023-06-25 DIAGNOSIS — Z6828 Body mass index (BMI) 28.0-28.9, adult: Secondary | ICD-10-CM | POA: Diagnosis not present

## 2023-06-25 DIAGNOSIS — T753XXA Motion sickness, initial encounter: Secondary | ICD-10-CM | POA: Diagnosis not present

## 2023-06-29 ENCOUNTER — Other Ambulatory Visit: Payer: Self-pay

## 2023-07-02 ENCOUNTER — Other Ambulatory Visit: Payer: Self-pay

## 2023-07-07 ENCOUNTER — Ambulatory Visit (INDEPENDENT_AMBULATORY_CARE_PROVIDER_SITE_OTHER): Payer: Medicare HMO

## 2023-07-07 DIAGNOSIS — I495 Sick sinus syndrome: Secondary | ICD-10-CM | POA: Diagnosis not present

## 2023-07-07 DIAGNOSIS — I447 Left bundle-branch block, unspecified: Secondary | ICD-10-CM

## 2023-07-08 LAB — CUP PACEART REMOTE DEVICE CHECK
Battery Impedance: 1228 Ohm
Battery Remaining Longevity: 39 mo
Battery Voltage: 2.75 V
Brady Statistic AP VP Percent: 1 %
Brady Statistic AP VS Percent: 83 %
Brady Statistic AS VP Percent: 0 %
Brady Statistic AS VS Percent: 16 %
Date Time Interrogation Session: 20250204182055
Implantable Lead Connection Status: 753985
Implantable Lead Connection Status: 753985
Implantable Lead Implant Date: 19970926
Implantable Lead Implant Date: 19970926
Implantable Lead Location: 753859
Implantable Lead Location: 753860
Implantable Lead Model: 4024
Implantable Lead Model: 4524
Implantable Pulse Generator Implant Date: 20171214
Lead Channel Impedance Value: 318 Ohm
Lead Channel Impedance Value: 852 Ohm
Lead Channel Pacing Threshold Amplitude: 0.875 V
Lead Channel Pacing Threshold Amplitude: 1.5 V
Lead Channel Pacing Threshold Pulse Width: 0.4 ms
Lead Channel Pacing Threshold Pulse Width: 0.4 ms
Lead Channel Setting Pacing Amplitude: 2.5 V
Lead Channel Setting Pacing Amplitude: 2.5 V
Lead Channel Setting Pacing Pulse Width: 0.4 ms
Lead Channel Setting Sensing Sensitivity: 4 mV
Zone Setting Status: 755011
Zone Setting Status: 755011

## 2023-07-16 ENCOUNTER — Other Ambulatory Visit: Payer: Self-pay

## 2023-07-16 ENCOUNTER — Other Ambulatory Visit (HOSPITAL_COMMUNITY): Payer: Self-pay

## 2023-07-22 ENCOUNTER — Other Ambulatory Visit: Payer: Self-pay

## 2023-07-28 ENCOUNTER — Other Ambulatory Visit: Payer: Self-pay

## 2023-07-29 ENCOUNTER — Other Ambulatory Visit: Payer: Self-pay

## 2023-07-30 ENCOUNTER — Other Ambulatory Visit: Payer: Self-pay

## 2023-07-31 ENCOUNTER — Encounter: Payer: Self-pay | Admitting: Cardiology

## 2023-07-31 ENCOUNTER — Ambulatory Visit: Payer: Medicare HMO | Attending: Cardiology | Admitting: Cardiology

## 2023-07-31 VITALS — BP 140/78 | HR 72 | Ht 61.0 in | Wt 150.0 lb

## 2023-07-31 DIAGNOSIS — I447 Left bundle-branch block, unspecified: Secondary | ICD-10-CM | POA: Diagnosis not present

## 2023-07-31 DIAGNOSIS — E785 Hyperlipidemia, unspecified: Secondary | ICD-10-CM | POA: Diagnosis not present

## 2023-07-31 DIAGNOSIS — Z79899 Other long term (current) drug therapy: Secondary | ICD-10-CM

## 2023-07-31 DIAGNOSIS — I2510 Atherosclerotic heart disease of native coronary artery without angina pectoris: Secondary | ICD-10-CM

## 2023-07-31 DIAGNOSIS — I495 Sick sinus syndrome: Secondary | ICD-10-CM | POA: Diagnosis not present

## 2023-07-31 DIAGNOSIS — I77819 Aortic ectasia, unspecified site: Secondary | ICD-10-CM

## 2023-07-31 DIAGNOSIS — Z950 Presence of cardiac pacemaker: Secondary | ICD-10-CM | POA: Diagnosis not present

## 2023-07-31 NOTE — Patient Instructions (Signed)
 Medication Instructions:  The current medical regimen is effective;  continue present plan and medications.  *If you need a refill on your cardiac medications before your next appointment, please call your pharmacy*  Follow-Up: At [facility], you and your health needs are our priority.  As part of our continuing mission to provide you with exceptional heart care, we have created designated Provider Care Teams.  These Care Teams include your primary Cardiologist (physician) and Advanced Practice Providers (APPs -  Physician Assistants and Nurse Practitioners) who all work together to provide you with the care you need, when you need it.  We recommend signing up for the patient portal called "MyChart".  Sign up information is provided on this After Visit Summary.  MyChart is used to connect with patients for Virtual Visits (Telemedicine).  Patients are able to view lab/test results, encounter notes, upcoming appointments, etc.  Non-urgent messages can be sent to your provider as well.   To learn more about what you can do with MyChart, go to [email or web address].    Your next appointment:   1 year(s)  Provider:   Donato Schultz, MD           1st Floor: - Lobby - Registration  - Pharmacy  - Lab - Cafe  2nd Floor: - PV Lab - Diagnostic Testing (echo, CT, nuclear med)  3rd Floor: - Vacant  4th Floor: - TCTS (cardiothoracic surgery) - AFib Clinic - Structural Heart Clinic - Vascular Surgery  - Vascular Ultrasound  5th Floor: - [facility] Cardiology (general and EP) - Clinical Pharmacy for coumadin, hypertension, lipid, weight-loss medications, and med management appointments    Valet parking services will be available as well.

## 2023-07-31 NOTE — Progress Notes (Signed)
 . Cardiology Office Note:  .   Date:  07/31/2023  ID:  [name], DOB Oct 26, 1945, MRN [id number] PCP: Daisy Floro, MD  [facility] Providers Cardiologist:  Donato Schultz, MD     History of Present Illness: .   [name] is a 78 y.o. female Discussed the use of AI scribe software for clinical note transcription with the patient, who gave verbal consent to proceed.  History of Present Illness [name] is a 78 year old female with coronary artery disease who presents for follow-up.  She has a known history of coronary artery disease with a cardiac catheterization in 2019 revealing 30-40% stenosis in the osteo left main and left-sided dominant circumflex with diffuse 60-70% proximal narrowing. She continues to take atorvastatin 80 mg, Zetia 10 mg, and aspirin 81 mg daily. Her LDL was recorded at 59 mg/dL on March 31, 2023, meeting her goal of less than 70 mg/dL.  She has a pacemaker due to left bundle branch block and sinus node dysfunction. The pacemaker was relocated to the right side to accommodate radiation therapy for prior breast cancer. She has about six months left on her current pacemaker, which will be her third.  Her blood pressure was previously low, leading to the discontinuation of Maxzide. However, her blood pressure readings at home are generally around 130/70 mmHg. She continues to take Maxzide 75/50 mg daily, as she did not stop the medication despite previous advice.  An echocardiogram performed on August 11, 2022, showed low normal pump function with an ejection fraction of 50% and no significant valvular disease. The aorta was mildly dilated at 41 mm.  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 She also takes her dog for walks, which she finds comforting.        Studies Reviewed: .        Results LABS LDL: 59 (03/31/2023) HbA1c: 5.6 Hb: 13.4 Cr: 0.7  DIAGNOSTIC Echocardiogram:  Low normal ejection fraction (EF 50%), no significant valvular disease, aorta 41 mm, mild ascending aortic dilation (08/11/2022) Risk Assessment/Calculations:           Physical Exam:   VS:  BP (!) 140/78   Pulse 72   Ht 5\' 1"  (1.549 m)   Wt 150 lb (68 kg)   SpO2 93%   BMI 28.34 kg/m    Wt Readings from Last 3 Encounters:  07/31/23 150 lb (68 kg)  04/08/23 151 lb 6.4 oz (68.7 kg)  07/16/22 155 lb (70.3 kg)    GEN: Well nourished, well developed in no acute distress NECK: No JVD; No carotid bruits CARDIAC: RRR, no murmurs, no rubs, no gallops RESPIRATORY:  Clear to auscultation without rales, wheezing or rhonchi  ABDOMEN: Soft, non-tender, non-distended EXTREMITIES:  No edema; No deformity   ASSESSMENT AND PLAN: .    Assessment and Plan Assessment & Plan Coronary Artery Disease (CAD) CAD with cardiac catheterization in 2019 showing 30-40% osteo left main and left-sided dominant circumflex with diffuse 60-70% proximal narrowing. Managed with atorvastatin 80 mg, Zetia 10 mg, and aspirin 81 mg daily. LDL goal <70, currently 59 as of 03/31/2023. Echocardiogram on 08/11/2022 showed EF 50%, no significant valvular disease. 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 No significant valvular disease. EF 50%. Discussed potential causes including genetics, blood pressure, and  age. Plan to recheck ultrasound in one year. - Recheck ultrasound in one year  Hypertension Blood pressure generally 130/70. Patient did not discontinue Maxzide as advised, currently taking Maxzide 75/50 mg daily. Discussed importance of regular monitoring, healthy diet, and physical activity. - Continue Maxzide 75/50 mg daily - Monitor blood pressure regularly  Pacemaker Management Pacemaker with left bundle branch block, translocated to the right side for radiation  therapy. Functioning well. Monthly readings sent to Dr. Graciela Husbands. Battery life approximately six months. Discussed replacement plan and regular monitoring. - Continue monthly pacemaker readings to Dr. Graciela Husbands - Plan for pacemaker replacement in approximately six months  General Health Maintenance Active lifestyle with regular walking and healthy diet. Hemoglobin A1c 5.6, hemoglobin 13.4, creatinine 0.7. Discussed importance of continuing physical activity and diet. - Continue regular physical activity - Maintain a healthy diet - Monitor lab values as needed  Follow-up - Schedule follow-up appointment in one year.           Signed, Donato Schultz, MD

## 2023-08-03 ENCOUNTER — Encounter: Payer: Self-pay | Admitting: Internal Medicine

## 2023-08-06 ENCOUNTER — Other Ambulatory Visit: Payer: Self-pay

## 2023-08-07 ENCOUNTER — Ambulatory Visit (HOSPITAL_COMMUNITY): Payer: Medicare HMO | Attending: Cardiology

## 2023-08-07 DIAGNOSIS — I77819 Aortic ectasia, unspecified site: Secondary | ICD-10-CM

## 2023-08-07 DIAGNOSIS — E785 Hyperlipidemia, unspecified: Secondary | ICD-10-CM | POA: Insufficient documentation

## 2023-08-07 DIAGNOSIS — I10 Essential (primary) hypertension: Secondary | ICD-10-CM | POA: Insufficient documentation

## 2023-08-07 DIAGNOSIS — Z950 Presence of cardiac pacemaker: Secondary | ICD-10-CM | POA: Diagnosis not present

## 2023-08-07 DIAGNOSIS — I5030 Unspecified diastolic (congestive) heart failure: Secondary | ICD-10-CM

## 2023-08-07 LAB — ECHOCARDIOGRAM COMPLETE
Area-P 1/2: 2.91 cm2
S' Lateral: 3.8 cm

## 2023-08-10 ENCOUNTER — Encounter: Payer: Self-pay | Admitting: Cardiology

## 2023-08-12 ENCOUNTER — Telehealth: Payer: Self-pay | Admitting: Cardiology

## 2023-08-12 DIAGNOSIS — Z79899 Other long term (current) drug therapy: Secondary | ICD-10-CM

## 2023-08-12 MED ORDER — SPIRONOLACTONE 25 MG PO TABS
12.5000 mg | ORAL_TABLET | Freq: Every day | ORAL | 3 refills | Status: DC
  Filled 2023-08-12: qty 15, 30d supply, fill #0
  Filled 2023-08-26 (×2): qty 15, 30d supply, fill #1
  Filled 2023-09-16 – 2023-09-17 (×2): qty 15, 30d supply, fill #2

## 2023-08-12 MED ORDER — ENTRESTO 49-51 MG PO TABS
1.0000 | ORAL_TABLET | Freq: Two times a day (BID) | ORAL | 3 refills | Status: DC
  Filled 2023-08-12 – 2023-08-13 (×2): qty 60, 30d supply, fill #0
  Filled 2023-08-13: qty 180, 90d supply, fill #0

## 2023-08-12 NOTE — Telephone Encounter (Signed)
 Spoke with patient she is aware of echo results and provider recommendations. Please see provider message below. She verbalized understanding. Repeated instructions back to me. Meds and lab ordered. APP appointment scheduled. Please see provider message below   Pump function is mild to moderately reduced with EF at 39%.  Aorta was measured 44 mm, personally reviewed may be slightly generous measurement based upon artifact.  More proximal measurement is 39 mm.  Previously 41 mm.   -We will continue to monitor aorta with echocardiogram next year -Given the reduced pump function, previously 50% now approximately 40%, I would like to stop the Maxide and start Entresto 49/51 by mouth twice a day. -Lets also add spironolactone 12.5 mg once a day (similar medication to previously used Maxide) -In 2 weeks lets check a basic metabolic profile. -In 2-3 months lets have her come back in and see an APP to make sure medications are being tolerated. Donato Schultz, MD

## 2023-08-12 NOTE — Telephone Encounter (Signed)
 Pt calling to get Echo results, cannot access her mychart. Requesting cb

## 2023-08-12 NOTE — Telephone Encounter (Signed)
 Left voicemail to return call to office

## 2023-08-13 ENCOUNTER — Other Ambulatory Visit (HOSPITAL_COMMUNITY): Payer: Self-pay

## 2023-08-13 ENCOUNTER — Telehealth: Payer: Self-pay | Admitting: Pharmacy Technician

## 2023-08-13 ENCOUNTER — Telehealth: Payer: Self-pay | Admitting: Cardiology

## 2023-08-13 ENCOUNTER — Other Ambulatory Visit: Payer: Self-pay

## 2023-08-13 DIAGNOSIS — I5032 Chronic diastolic (congestive) heart failure: Secondary | ICD-10-CM | POA: Diagnosis not present

## 2023-08-13 DIAGNOSIS — F4321 Adjustment disorder with depressed mood: Secondary | ICD-10-CM | POA: Diagnosis not present

## 2023-08-13 DIAGNOSIS — J019 Acute sinusitis, unspecified: Secondary | ICD-10-CM | POA: Diagnosis not present

## 2023-08-13 DIAGNOSIS — I359 Nonrheumatic aortic valve disorder, unspecified: Secondary | ICD-10-CM | POA: Diagnosis not present

## 2023-08-13 DIAGNOSIS — I10 Essential (primary) hypertension: Secondary | ICD-10-CM | POA: Diagnosis not present

## 2023-08-13 DIAGNOSIS — Z6829 Body mass index (BMI) 29.0-29.9, adult: Secondary | ICD-10-CM | POA: Diagnosis not present

## 2023-08-13 MED ORDER — ENTRESTO 49-51 MG PO TABS
1.0000 | ORAL_TABLET | Freq: Two times a day (BID) | ORAL | Status: DC
Start: 2023-08-13 — End: 2023-09-22

## 2023-08-13 MED ORDER — ENTRESTO 49-51 MG PO TABS: 1.0000 | ORAL_TABLET | Freq: Two times a day (BID) | ORAL | Status: DC

## 2023-08-13 NOTE — Progress Notes (Unsigned)
 Cardiology Office Note:  .   Date:  08/14/2023  ID:  [name], DOB 1946-01-27, MRN [id number] PCP: Daisy Floro, MD  [facility] Providers Cardiologist:  Donato Schultz, MD {  History of Present Illness: .   [name] is a 78 y.o. female with a past medical history of coronary artery disease presents for follow-up appointment.  History includes cardiac catheterization 2019 revealing 30 to 40% stenosis in the ostial left main and left-sided dominant circumflex with diffuse 60 to 70% proximal narrowing.  Continues to take atorvastatin 80 mg, Zetia 10 mg and aspirin 81 mg daily.  LDL was 59 mg/dL on March 31, 2023 meeting goal of less than 70.  She has a pacemaker due to a left bundle branch block and sinus node dysfunction.  Pacemaker was relocated to the right side to accommodate radiation therapy for prior breast cancer.  She was about 6 months out from needing a repeat pacemaker which would be her third.  Blood pressure was previously low leading to discontinuation of Maxide however blood pressure readings at home have been 130/70.  Continues to take Maxide 75/50 mg daily and did not stop medication despite previous advice.  Echocardiogram performed August 11, 2022 showed low normal pump function with ejection fraction of 50% no significant valvular disease.  The aorta was mildly dilated at 41 mm.  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  Also takes her dog for walks which she finds comforting.  Today, she presents with a history of heart failure, hypertension, and breast cancer. She presents today with concerns about her high blood pressure and fatigue. She reports that her blood pressure has been consistently high, despite her usual diet and lifestyle habits. She also reports feeling "taxed" and "pulled down," with decreased energy and stamina. She has noticed difficulty breathing when lying on her side at night,  which is a new symptom for her. She has been managing this by elevating her bed and doing deep breathing exercises. She is not currently experiencing any other symptoms of heart failure. She is due to start new medications, Entresto and Spironolactone, and has been advised to monitor her blood pressure at home.  Reports no shortness of breath nor dyspnea on exertion. Reports no chest pain, pressure, or tightness. No edema,  PND. Reports no palpitations.   Discussed the use of AI scribe software for clinical note transcription with the patient, who gave verbal consent to proceed.  ROS: pertinent ROS in HPI  Studies Reviewed: .       Echo 08/07/23 IMPRESSIONS     1. Left ventricular ejection fraction, by estimation, is 40 to 45%. Left  ventricular ejection fraction by 3D volume is 39 %. The left ventricle has  mildly decreased function. The left ventricle demonstrates global  hypokinesis. Left ventricular diastolic   parameters are consistent with Grade I diastolic dysfunction (impaired  relaxation). The average left ventricular global longitudinal strain is  -13.8 %. The global longitudinal strain is abnormal.   2. Right ventricular systolic function is normal. The right ventricular  size is normal. There is normal pulmonary artery systolic pressure. The  estimated right ventricular systolic pressure is 24.5 mmHg.   3. The mitral valve is normal in structure. Trivial mitral valve  regurgitation. No evidence of mitral stenosis.   4. The aortic valve is tricuspid. Aortic valve regurgitation is not  visualized. Aortic valve sclerosis/calcification is present, without any  evidence of  aortic stenosis.   5. Aortic dilatation noted. There is mild dilatation of the ascending  aorta, measuring 44 mm.   6. The inferior vena cava is normal in size with greater than 50%  respiratory variability, suggesting right atrial pressure of 3 mmHg.   FINDINGS   Left Ventricle: Left ventricular ejection  fraction, by estimation, is 40  to 45%. Left ventricular ejection fraction by 3D volume is 39 %. The left  ventricle has mildly decreased function. The left ventricle demonstrates  global hypokinesis. The average  left ventricular global longitudinal strain is -13.8 %. Strain was  performed and the global longitudinal strain is abnormal. The left  ventricular internal cavity size was normal in size. There is no left  ventricular hypertrophy. Abnormal (paradoxical)  septal motion, consistent with RV pacemaker. Left ventricular diastolic  parameters are consistent with Grade I diastolic dysfunction (impaired  relaxation). Normal left ventricular filling pressure.   Right Ventricle: The right ventricular size is normal. No increase in  right ventricular wall thickness. Right ventricular systolic function is  normal. There is normal pulmonary artery systolic pressure. The tricuspid  regurgitant velocity is 2.32 m/s, and   with an assumed right atrial pressure of 3 mmHg, the estimated right  ventricular systolic pressure is 24.5 mmHg.   Left Atrium: Left atrial size was normal in size.   Right Atrium: Right atrial size was normal in size.   Pericardium: There is no evidence of pericardial effusion.   Mitral Valve: The mitral valve is normal in structure. Trivial mitral  valve regurgitation. No evidence of mitral valve stenosis.   Tricuspid Valve: The tricuspid valve is normal in structure. Tricuspid  valve regurgitation is trivial. No evidence of tricuspid stenosis.   Aortic Valve: The aortic valve is tricuspid. Aortic valve regurgitation is  not visualized. Aortic valve sclerosis/calcification is present, without  any evidence of aortic stenosis.   Pulmonic Valve: The pulmonic valve was normal in structure. Pulmonic valve  regurgitation is trivial. No evidence of pulmonic stenosis.   Aorta: Aortic dilatation noted. There is mild dilatation of the ascending  aorta, measuring 44 mm.    Venous: The inferior vena cava is normal in size with greater than 50%  respiratory variability, suggesting right atrial pressure of 3 mmHg.   IAS/Shunts: No atrial level shunt detected by color flow Doppler.   Additional Comments: 3D was performed not requiring image post processing  on an independent workstation and was abnormal. A device lead is  visualized.  VS:  BP (!) 148/82   Pulse 62   Ht 5\' 1"  (1.549 m)   Wt 152 lb 12.8 oz (69.3 kg)   SpO2 98%   BMI 28.87 kg/m    Wt Readings from Last 3 Encounters:  08/14/23 152 lb 12.8 oz (69.3 kg)  07/31/23 150 lb (68 kg)  04/08/23 151 lb 6.4 oz (68.7 kg)    GEN: Well nourished, well developed in no acute distress NECK: No JVD; No carotid bruits CARDIAC: RRR, no murmurs, rubs, gallops RESPIRATORY:  Clear to auscultation without rales, wheezing or rhonchi  ABDOMEN: Soft, non-tender, non-distended EXTREMITIES:  No edema; No deformity   ASSESSMENT AND PLAN: .    Heart Failure with Reduced Ejection Fraction (HFrEF) Moderately reduced ejection fraction at 39% with symptoms of fatigue, dyspnea on exertion, and orthopnea. Initiated Entresto and spironolactone to improve cardiac function and manage symptoms. Follow-up echocardiogram in three months to evaluate treatment efficacy. - Start Entresto at a moderate dose, twice daily. -  Start spironolactone once daily in the morning. - Monitor blood pressure at home three to four times daily. - Schedule a follow-up echocardiogram in three months. - Schedule a follow-up appointment in two to three months.  Hypertension Elevated blood pressure with recent readings higher than usual. Managing with Entresto and spironolactone. Goal is to maintain blood pressure under 135/85 mmHg. - Start Entresto and spironolactone as per heart failure management. - Monitor blood pressure at home and report readings. - Schedule a BMP in two weeks to check kidney function.  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 Slightly enlarged aorta. Main concern is blood pressure control to prevent further dilation. - Monitor blood pressure as per hypertension management. - Annual echocardiogram to monitor aortic size.       Dispo: follow-up as scheduled with Robin Searing, NP  Signed, Sharlene Dory, PA-C

## 2023-08-13 NOTE — Addendum Note (Signed)
 Addended by: Neita Goodnight B on: 08/13/2023 05:56 PM   Modules accepted: Orders

## 2023-08-13 NOTE — Telephone Encounter (Signed)
 Patient Advocate Encounter   The patient was approved for a [employer] grant that will help cover the cost of entresto Total amount awarded, [id number].  Effective: 07/14/23 - 07/12/24   [id number] MVH:[id number] [employer] ID: [id number]   Pharmacy provided with approval and processing information. Patient informed via telephone

## 2023-08-13 NOTE — Addendum Note (Signed)
 Addended by: Geralyn Flash D on: 08/13/2023 03:21 PM   Modules accepted: Orders

## 2023-08-13 NOTE — Telephone Encounter (Signed)
 Patient following up to request samples for Entresto + address BP concerns.  Patient calling the office for samples of medication:  1.  What medication and dosage are you requesting samples for? Entresto  2.  Are you currently out of this medication?  Hasn't started yet  Pt c/o BP issue: STAT if pt c/o blurred vision, one-sided weakness or slurred speech.   1. What is your BP concern?  Systolic above 180 today. PCP recommended following up with cardiology.  2. Have you taken any BP medication today?  3. What are your last 5 BP readings? 183/80 then 163/75 30 minutes later  4. Are you having any other symptoms (ex. Dizziness, headache, blurred vision, passed out)?  Tired/fatigued, when laying on her side she has to sit up and take deep breaths

## 2023-08-13 NOTE — Telephone Encounter (Signed)
 Pt c/o medication issue:  1. Name of Medication: sacubitril-valsartan (ENTRESTO) 49-51 MG   2. How are you currently taking this medication (dosage and times per day)?   3. Are you having a reaction (difficulty breathing--STAT)?   4. What is your medication issue? Per [facility], med will cost pt $177.64 which pt cannot afford. Please call to discuss

## 2023-08-13 NOTE — Progress Notes (Signed)
 Remote pacemaker transmission.

## 2023-08-13 NOTE — Telephone Encounter (Signed)
 Spoke with patient and she reports that she was at her PCP due to having acute sinusitis. Pt reports blood pressure was 183/80 and decreased to 163/75 while at their office. Pt reports occasional chest discomfort on the left side, but reports a history of breast cancer on that side and states discomfort is similar to what she normally has. Pt admits to occasional shortness of breath as well. Pt denies dizziness, vision changes, and headaches. Pt admits that she has not started entresto or spironolactone yet. Pt states that she took triamterene-hydrochlorothiazide last night. Spoke to DOD (Dr. Nelly Laurence) and advised pt to take her normal dose of Triamterene-hydrochlorothiazide this evening, leave samples of entresto at the front for the pt to pick up, and schedule appt with APP. Samples are at the front (LOT #: [id number]/ EXP: 05/01/25) for 2 weeks and appointment has been made with Jari Favre, PA-C on 08/14/23. Pt advised of chest pain protocol and to watch for signs and symptoms of stroke. Advised to go to ER if needed. Pt agreed with plan of care.

## 2023-08-14 ENCOUNTER — Encounter: Payer: Self-pay | Admitting: Physician Assistant

## 2023-08-14 ENCOUNTER — Ambulatory Visit: Attending: Physician Assistant | Admitting: Physician Assistant

## 2023-08-14 VITALS — BP 148/82 | HR 62 | Ht 61.0 in | Wt 152.8 lb

## 2023-08-14 DIAGNOSIS — Z79899 Other long term (current) drug therapy: Secondary | ICD-10-CM

## 2023-08-14 DIAGNOSIS — I77819 Aortic ectasia, unspecified site: Secondary | ICD-10-CM | POA: Diagnosis not present

## 2023-08-14 DIAGNOSIS — Z950 Presence of cardiac pacemaker: Secondary | ICD-10-CM | POA: Diagnosis not present

## 2023-08-14 DIAGNOSIS — I5020 Unspecified systolic (congestive) heart failure: Secondary | ICD-10-CM

## 2023-08-14 NOTE — Patient Instructions (Signed)
 Medication Instructions:  Start Entresto 49-51 mg twice daily Start Spironolactone 12.5 mg daily (1/2 tablet) *If you need a refill on your cardiac medications before your next appointment, please call your pharmacy*   Lab Work: BMP in 2 weeks (3/28) If you have labs (blood work) drawn today and your tests are completely normal, you will receive your results only by: MyChart Message (if you have MyChart) OR A paper copy in the mail If you have any lab test that is abnormal or we need to change your treatment, we will call you to review the results.   Testing/Procedures: Echocardiogram Your physician has requested that you have an echocardiogram. Echocardiography is a painless test that uses sound waves to create images of your heart. It provides your doctor with information about the size and shape of your heart and how well your heart's chambers and valves are working. This procedure takes approximately one hour. There are no restrictions for this procedure. Please do NOT wear cologne, perfume, aftershave, or lotions (deodorant is allowed). Please arrive 15 minutes prior to your appointment time.  Please note: We ask at that you not bring children with you during ultrasound (echo/ vascular) testing. Due to room size and safety concerns, children are not allowed in the ultrasound rooms during exams. Our front office staff cannot provide observation of children in our lobby area while testing is being conducted. An adult accompanying a patient to their appointment will only be allowed in the ultrasound room at the discretion of the ultrasound technician under special circumstances. We apologize for any inconvenience.    Follow-Up: At [facility], you and your health needs are our priority.  As part of our continuing mission to provide you with exceptional heart care, we have created designated Provider Care Teams.  These Care Teams include your primary Cardiologist (physician) and  Advanced Practice Providers (APPs -  Physician Assistants and Nurse Practitioners) who all work together to provide you with the care you need, when you need it.  We recommend signing up for the patient portal called "MyChart".  Sign up information is provided on this After Visit Summary.  MyChart is used to connect with patients for Virtual Visits (Telemedicine).  Patients are able to view lab/test results, encounter notes, upcoming appointments, etc.  Non-urgent messages can be sent to your provider as well.   To learn more about what you can do with MyChart, go to [email or web address].    Your next appointment:   As scheduled  Provider:   Robin Searing, NP  Other Instructions   1st Floor: - Lobby - Registration  - Pharmacy  - Lab - Cafe  2nd Floor: - PV Lab - Diagnostic Testing (echo, CT, nuclear med)  3rd Floor: - Vacant  4th Floor: - TCTS (cardiothoracic surgery) - AFib Clinic - Structural Heart Clinic - Vascular Surgery  - Vascular Ultrasound  5th Floor: - [facility] Cardiology (general and EP) - Clinical Pharmacy for coumadin, hypertension, lipid, weight-loss medications, and med management appointments    Valet parking services will be available as well.

## 2023-08-17 NOTE — Telephone Encounter (Signed)
 Pt was seen in office by Jari Favre, PA 08/14/23.  Please see that office visit note for further documentation.

## 2023-08-19 ENCOUNTER — Telehealth: Payer: Self-pay | Admitting: Cardiology

## 2023-08-19 NOTE — Telephone Encounter (Signed)
 Asa Lente, [name] N, PA-C  Clio, Russell, RN3 hours ago (1:16 PM)    Please increase her Sherryll Burger 65/784. Have her keep track for a week. Thanks!  Sharlene Dory, PA-C    Left the pt a message to call the office back to endorse med recommendations as mentioned above per Jari Favre PA-C.

## 2023-08-19 NOTE — Telephone Encounter (Signed)
 Left a message to call back.

## 2023-08-19 NOTE — Telephone Encounter (Signed)
 Pt calling to report BP as requested since starting   sacubitril-valsartan (ENTRESTO) 49-51 MG  And spironolactone (ALDACTONE) 25 MG tablet   Fri 14 - 169/86 -morn.   168/84 - noon   169/81 - night  Sat 15 - 169/79 - morn   164/83 - noon   169/70 - night  Sun 16 - 166/70 - morn   167/96 - noon   169/74 - night  Mon 17 - 169/78 -morn   167/79 - noon   159/80 - night  Tues 18 - 180/79 - morn    178/73 - noon    169/77 - night  Today - 160/79

## 2023-08-19 NOTE — Telephone Encounter (Signed)
  Pt is returning call, she said, she will wait for the callback tomorrow morning

## 2023-08-19 NOTE — Telephone Encounter (Signed)
 Left a message for the pt to call back.

## 2023-08-20 ENCOUNTER — Other Ambulatory Visit (HOSPITAL_COMMUNITY): Payer: Self-pay

## 2023-08-20 ENCOUNTER — Other Ambulatory Visit: Payer: Self-pay

## 2023-08-20 MED ORDER — ENTRESTO 97-103 MG PO TABS
1.0000 | ORAL_TABLET | Freq: Two times a day (BID) | ORAL | 4 refills | Status: DC
  Filled 2023-08-20: qty 60, 30d supply, fill #0
  Filled 2023-09-12 – 2023-09-17 (×2): qty 60, 30d supply, fill #1

## 2023-08-20 NOTE — Telephone Encounter (Signed)
 Pt aware that per Jari Favre PA-C, we will now increase her Entresto to the 97/103 mg po bid and she needs to check/log her pressures for a week, and bring a copy/mychart those to [facility] to further review thereafter.  Confirmed the pharmacy of choice with the pt.   Pt education provided about how to correctly take and log her BP readings and when to do this after med administration.   Pt states she will drop off her recordings to the front desk, when she comes in for lab work next Friday.  Pt verbalized understanding and agrees with this plan.

## 2023-08-20 NOTE — Telephone Encounter (Signed)
 Pt was returning nurse call and is requesting a callback. She'd like to speak with [name]. Please advise

## 2023-08-24 ENCOUNTER — Other Ambulatory Visit: Payer: Self-pay

## 2023-08-26 ENCOUNTER — Other Ambulatory Visit (HOSPITAL_COMMUNITY): Payer: Self-pay

## 2023-08-27 ENCOUNTER — Telehealth: Payer: Self-pay | Admitting: Cardiology

## 2023-08-27 DIAGNOSIS — Z79899 Other long term (current) drug therapy: Secondary | ICD-10-CM

## 2023-08-27 NOTE — Telephone Encounter (Signed)
 Pt c/o medication issue:  1. Name of Medication: spironolactone (ALDACTONE) 25 MG tablet   2. How are you currently taking this medication (dosage and times per day)? 25 mg, Daily   3. Are you having a reaction (difficulty breathing--STAT)? No  4. What is your medication issue? Pt has been taking 25mg  instead of 12.5mg  and is supposed to have labs done in the morning and is requesting cb

## 2023-08-27 NOTE — Telephone Encounter (Signed)
 Per OV note by Jari Favre, PA on 08/14/23: Hypertension Elevated blood pressure with recent readings higher than usual. Managing with Entresto and spironolactone. Goal is to maintain blood pressure under 135/85 mmHg. - Start Entresto and spironolactone as per heart failure management. - Monitor blood pressure at home and report readings. - Schedule a BMP in two weeks to check kidney function.  Patient states that she didn't realize she was supposed to be cutting the Spironolactone in half and has been taking the full 25mg  tablet. She realized this when she called pharmacy for refill. Advised her to continue with plans for blood work tomorrow which will show her K level and kidney functioning (we also increased her Entresto) and we can go from there, but to decrease to half tablet effective today. Verbalized understanding. States she has BP log to drop off here at office and will do that tomorrow as well.

## 2023-08-27 NOTE — Telephone Encounter (Signed)
Pt was returning LPN call and is requesting a callback. Please advise  

## 2023-08-27 NOTE — Telephone Encounter (Signed)
 Left voicemail to return call to office

## 2023-08-28 ENCOUNTER — Other Ambulatory Visit (HOSPITAL_COMMUNITY): Payer: Self-pay

## 2023-08-28 ENCOUNTER — Telehealth: Payer: Self-pay | Admitting: Cardiology

## 2023-08-28 DIAGNOSIS — Z79899 Other long term (current) drug therapy: Secondary | ICD-10-CM | POA: Diagnosis not present

## 2023-08-28 NOTE — Telephone Encounter (Signed)
 Paper Work Dropped Off: Blood pressure readings  Date:08/28/2023  Location of paper: Dr Anne Fu (mailbox)

## 2023-08-29 LAB — BASIC METABOLIC PANEL WITH GFR
BUN/Creatinine Ratio: 20 (ref 12–28)
BUN: 18 mg/dL (ref 8–27)
CO2: 24 mmol/L (ref 20–29)
Calcium: 9.5 mg/dL (ref 8.7–10.3)
Chloride: 104 mmol/L (ref 96–106)
Creatinine, Ser: 0.9 mg/dL (ref 0.57–1.00)
Glucose: 80 mg/dL (ref 70–99)
Potassium: 5.5 mmol/L — ABNORMAL HIGH (ref 3.5–5.2)
Sodium: 141 mmol/L (ref 134–144)
eGFR: 65 mL/min/{1.73_m2} (ref >59)

## 2023-08-31 ENCOUNTER — Encounter: Payer: Self-pay | Admitting: Cardiology

## 2023-08-31 ENCOUNTER — Other Ambulatory Visit (HOSPITAL_BASED_OUTPATIENT_CLINIC_OR_DEPARTMENT_OTHER): Payer: Self-pay | Admitting: *Deleted

## 2023-08-31 DIAGNOSIS — Z79899 Other long term (current) drug therapy: Secondary | ICD-10-CM

## 2023-08-31 DIAGNOSIS — E875 Hyperkalemia: Secondary | ICD-10-CM

## 2023-09-07 DIAGNOSIS — Z79899 Other long term (current) drug therapy: Secondary | ICD-10-CM | POA: Diagnosis not present

## 2023-09-08 ENCOUNTER — Encounter: Payer: Self-pay | Admitting: Physician Assistant

## 2023-09-08 ENCOUNTER — Telehealth: Payer: Self-pay | Admitting: Cardiology

## 2023-09-08 LAB — BASIC METABOLIC PANEL WITH GFR
BUN/Creatinine Ratio: 20 (ref 12–28)
BUN: 15 mg/dL (ref 8–27)
CO2: 20 mmol/L (ref 20–29)
Calcium: 9.5 mg/dL (ref 8.7–10.3)
Chloride: 104 mmol/L (ref 96–106)
Creatinine, Ser: 0.75 mg/dL (ref 0.57–1.00)
Glucose: 79 mg/dL (ref 70–99)
Potassium: 4.8 mmol/L (ref 3.5–5.2)
Sodium: 141 mmol/L (ref 134–144)
eGFR: 81 mL/min/{1.73_m2} (ref >59)

## 2023-09-08 NOTE — Telephone Encounter (Signed)
 Patient stated she is calling to follow-up on her test results because she does not do MyChart.  Patient reported the following BP readings as requested.  3/29 -  AM - 167/77  HR 68 PM  -  3/30 -  AM - 154/74  HR 75 PM - 144/73  HR 60 3/31 -  AM - 149/76  HR 73  PM - 141/68  HR 73 4/1 -  AM - 148/71  HR 68  PM - 150/70  HR 66 4/2 -  AM - 151/72  HR 70  PM - 150/68  HR 67 4/3 -  AM - 148/76  HR 72  PM - 159/79  HR 74 4/4 -  AM - 170/80  HR 68  PM - 150/73  HR 66 4/5 -  AM - 149/70  HR 69  PM - 143/68  HR 66 4/6 -  AM - 149/64  HR 63  PM - 136/64  HR 61 4/7 -  AM - 139/63 HR 67  PM - 140/69  HR 70

## 2023-09-08 NOTE — Telephone Encounter (Signed)
 Reviewed lab results with pt who states understanding.  Pt would like for [name] to review her most recent blood pressures.  Will forward to her for review.

## 2023-09-08 NOTE — Telephone Encounter (Signed)
 Left message for pt to call back

## 2023-09-08 NOTE — Telephone Encounter (Signed)
 Pt returning call, requesting cb

## 2023-09-12 ENCOUNTER — Other Ambulatory Visit (HOSPITAL_COMMUNITY): Payer: Self-pay

## 2023-09-16 ENCOUNTER — Other Ambulatory Visit: Payer: Self-pay

## 2023-09-17 ENCOUNTER — Other Ambulatory Visit: Payer: Self-pay

## 2023-09-18 ENCOUNTER — Other Ambulatory Visit: Payer: Self-pay

## 2023-09-22 ENCOUNTER — Telehealth: Payer: Self-pay | Admitting: Cardiology

## 2023-09-22 DIAGNOSIS — Z79899 Other long term (current) drug therapy: Secondary | ICD-10-CM

## 2023-09-22 MED ORDER — SPIRONOLACTONE 25 MG PO TABS
12.5000 mg | ORAL_TABLET | Freq: Every day | ORAL | 3 refills | Status: AC
  Filled 2023-09-22 – 2023-10-13 (×3): qty 45, 90d supply, fill #0
  Filled 2023-10-20: qty 15, 30d supply, fill #0
  Filled 2023-11-04 – 2023-11-12 (×2): qty 15, 30d supply, fill #1
  Filled 2023-12-09: qty 15, 30d supply, fill #2
  Filled 2024-01-12: qty 15, 30d supply, fill #3
  Filled 2024-02-05: qty 15, 30d supply, fill #4
  Filled 2024-03-06: qty 15, 30d supply, fill #5
  Filled 2024-04-05: qty 15, 30d supply, fill #6
  Filled 2024-04-29: qty 15, 30d supply, fill #7
  Filled 2024-05-29: qty 15, 30d supply, fill #8
  Filled 2024-06-27: qty 15, 30d supply, fill #9

## 2023-09-22 MED ORDER — ENTRESTO 49-51 MG PO TABS
1.0000 | ORAL_TABLET | Freq: Two times a day (BID) | ORAL | 3 refills | Status: DC
  Filled 2023-09-22 – 2023-10-05 (×2): qty 180, 90d supply, fill #0
  Filled 2023-10-13 – 2023-10-20 (×2): qty 60, 30d supply, fill #0

## 2023-09-22 NOTE — Telephone Encounter (Signed)
 Reviewed concerns with Dr Renna Cary who advised to decrease Entresto  49-51 mg BID.  Pt aware and prescription sent into pharmacy of choice.  She will keep a blood pressure log and notify us  if further concerns. Reviewed upcoming appointments.  She had no further concerns at the end of the call.

## 2023-09-22 NOTE — Telephone Encounter (Signed)
 Spoke with pt regarding her blood pressures. Pt's blood pressure was 93/50 on Sun AM and 95/52 Sun PM. Pt did not take her Entresto  97-103 that day because of her low bp. Pt stated she was not advised to hold it but was afraid to take it with such a low bp. Monday morning the pt's bp was 151/70. She took her Entresto  and in the AM. Her bp was 128/52 that evening and she took her Entresto . Pt stated that she has been feeling fatigued and gets dizzy occassional with position changes. Pt was told that the information provided would be sent to Dr. Renna Cary and his nurse for suggestions. Pt verbalized understanding. All questions if any were answered.

## 2023-09-22 NOTE — Telephone Encounter (Signed)
 See telephone note from today in regards to BP readings and recommendations from pts Primary Cards, Dr. Renna Cary.

## 2023-09-22 NOTE — Telephone Encounter (Signed)
 Pt c/o BP issue:  1. What are your last 5 BP readings? 136/66 2. Are you having any other symptoms (ex. Dizziness, headache, blurred vision, passed out)? Exhausted, a little lightheadedness 3. What is your medication issue? Fluctuating blood pressure reading

## 2023-09-23 ENCOUNTER — Other Ambulatory Visit: Payer: Self-pay

## 2023-09-23 ENCOUNTER — Other Ambulatory Visit (HOSPITAL_COMMUNITY): Payer: Self-pay

## 2023-10-05 ENCOUNTER — Other Ambulatory Visit: Payer: Self-pay

## 2023-10-06 ENCOUNTER — Ambulatory Visit (INDEPENDENT_AMBULATORY_CARE_PROVIDER_SITE_OTHER): Payer: Medicare HMO

## 2023-10-06 DIAGNOSIS — I495 Sick sinus syndrome: Secondary | ICD-10-CM

## 2023-10-12 DIAGNOSIS — I10 Essential (primary) hypertension: Secondary | ICD-10-CM | POA: Diagnosis not present

## 2023-10-12 DIAGNOSIS — W57XXXA Bitten or stung by nonvenomous insect and other nonvenomous arthropods, initial encounter: Secondary | ICD-10-CM | POA: Diagnosis not present

## 2023-10-12 DIAGNOSIS — I7121 Aneurysm of the ascending aorta, without rupture: Secondary | ICD-10-CM | POA: Diagnosis not present

## 2023-10-12 DIAGNOSIS — L299 Pruritus, unspecified: Secondary | ICD-10-CM | POA: Diagnosis not present

## 2023-10-12 DIAGNOSIS — Z6828 Body mass index (BMI) 28.0-28.9, adult: Secondary | ICD-10-CM | POA: Diagnosis not present

## 2023-10-12 LAB — CUP PACEART REMOTE DEVICE CHECK
Battery Impedance: 1337 Ohm
Battery Remaining Longevity: 36 mo
Battery Voltage: 2.75 V
Brady Statistic AP VP Percent: 1 %
Brady Statistic AP VS Percent: 85 %
Brady Statistic AS VP Percent: 0 %
Brady Statistic AS VS Percent: 14 %
Date Time Interrogation Session: 20250509144443
Implantable Lead Connection Status: 753985
Implantable Lead Connection Status: 753985
Implantable Lead Implant Date: 19970926
Implantable Lead Implant Date: 19970926
Implantable Lead Location: 753859
Implantable Lead Location: 753860
Implantable Lead Model: 4024
Implantable Lead Model: 4524
Implantable Pulse Generator Implant Date: 20171214
Lead Channel Impedance Value: 322 Ohm
Lead Channel Impedance Value: 801 Ohm
Lead Channel Pacing Threshold Amplitude: 0.75 V
Lead Channel Pacing Threshold Amplitude: 1.5 V
Lead Channel Pacing Threshold Pulse Width: 0.4 ms
Lead Channel Pacing Threshold Pulse Width: 0.4 ms
Lead Channel Setting Pacing Amplitude: 2.5 V
Lead Channel Setting Pacing Amplitude: 2.5 V
Lead Channel Setting Pacing Pulse Width: 0.4 ms
Lead Channel Setting Sensing Sensitivity: 4 mV
Zone Setting Status: 755011
Zone Setting Status: 755011

## 2023-10-13 ENCOUNTER — Other Ambulatory Visit: Payer: Self-pay

## 2023-10-13 ENCOUNTER — Other Ambulatory Visit (HOSPITAL_COMMUNITY): Payer: Self-pay

## 2023-10-14 ENCOUNTER — Other Ambulatory Visit (HOSPITAL_COMMUNITY): Payer: Self-pay

## 2023-10-14 ENCOUNTER — Other Ambulatory Visit: Payer: Self-pay

## 2023-10-15 ENCOUNTER — Other Ambulatory Visit (HOSPITAL_COMMUNITY): Payer: Self-pay

## 2023-10-15 ENCOUNTER — Other Ambulatory Visit: Payer: Self-pay

## 2023-10-15 NOTE — Progress Notes (Addendum)
 Cardiology Office Note    Patient Name: [name] Date of Encounter: 10/15/2023  Primary Care Provider:  Jimmey Mould, MD Primary Cardiologist:  Dorothye Gathers, MD Primary Electrophysiologist: None   Past Medical History    Past Medical History:  Diagnosis Date   Allergic rhinitis    Arthritis    Family history of breast cancer    Family history of colon cancer    Family history of pancreatic cancer    GERD (gastroesophageal reflux disease)    GI bleed    History of basal cell cancer 11/08/2008   RIGHT SUBAURICULAR   History of radiation therapy 08/05/2016 - 09/12/2016   Left Breast 50.4 Gy 28 fractions   Hyperlipidemia    Hypertension    Hypothyroidism    Lumbar disc disease    Malignant neoplasm of upper-outer quadrant of left female breast (HCC) 04/11/2016   Pacemaker    Peptic ulcer disease    SCC (squamous cell carcinoma) 09/09/2017   LEFT WRIST CX 3 5FU   Squamous cell carcinoma of skin 05/30/2013   LEFT LOWER BACK   SSS (sick sinus syndrome) (HCC)    Thyroid  disease     History of Present Illness  [name] is a 78 y.o. female with a PMH of CAD with LHC 2019 and 30-40% stenosis in the ostial left main and diffuse 60 to 70% narrowing of the circumflex, HFrEF, HLD, hypothyroidism, GERD, sinus node dysfunction s/p Medtronic PPM 2001, LBBB, breast CA s/p XRT, aortic dilation, aortic atherosclerosis who presents today for labile BP.  [name] has been followed by Dr. Rodolfo Clan for management of PPM that was placed due to sinus node dysfunction and BP.  She had device transplanted to the right side to accommodate for XRT therapy for breast CA.  She is currently managed by Dr. Renna Cary for cardiac management and was previously followed by Dr. Felipe Horton.  She underwent a LHC in 2019 that showed 30 to 40% stenosis of the ostial left main with 60 to 70% proximal narrowing and was continued on medical therapy.   She was seen and establish care with Dr. Renna Cary on 07/31/2023  reported no chest pain and BP was well-controlled on Maxide.  She had an updated 2D echo on 08/07/2023 that showed moderately reduced EF of 39% with left ventricular hypokinesis and grade 1 DD and trivial MR with aortic dilation measuring 44 mm.  Aortic dilation is monitored with yearly echoes.  She was started on GDMT with Entresto  49/51 mg and spironolactone  with Maxide discontinued.. She contacted our office on 09/22/2023 with complaint of labile BP on increased dose of Entresto  97/103.  She noted BP of 93/50 and 95/52.  She also noted fatigue and dizziness.  Dr. Renna Cary recommended decreasing dose to 49/51 mg twice daily on 09/22/2023.   [name] presents today for follow-up of blood pressure fatigued with dizziness. She is currently taking Entresto  49/51 mg twice daily with improvement to blood pressures and no labile readings. She experiences increased fatigue and restlessness, particularly at night, which she attributes to her medication. She has difficulty sleeping, often staying awake until 2-4 AM, and feels more tired during the day despite trying to stay active. No dizziness is reported, but she feels more tired since the medication adjustment. She takes a diuretic in the morning and avoids consuming fluids at night to prevent nocturia. She has a history of congestive heart failure and aortic dilation, which she attributes to high blood pressure and possibly  past radiation therapy for breast cancer. Her heart function was measured at 39% in March, a decrease from previous measurements. She is managing her health independently following the death of her husband in 04-Jun-2022. 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 Patient denies chest pain, palpitations, dyspnea, PND, orthopnea, nausea, vomiting, dizziness, syncope, edema, weight gain, or early satiety.  Discussed the use of AI scribe software for clinical note transcription with the patient,  who gave verbal consent to proceed.  History of Present Illness   Review of Systems  Please see the history of present illness.    All other systems reviewed and are otherwise negative except as noted above.  Physical Exam     Wt Readings from Last 3 Encounters:  08/14/23 152 lb 12.8 oz (69.3 kg)  07/31/23 150 lb (68 kg)  04/08/23 151 lb 6.4 oz (68.7 kg)   JX:BJYNW were no vitals filed for this visit.,There is no height or weight on file to calculate BMI. GEN: Well nourished, well developed in no acute distress Neck: No JVD; No carotid bruits Pulmonary: Clear to auscultation without rales, wheezing or rhonchi  Cardiovascular: Normal rate. Regular rhythm. Normal S1. Normal S2.   Murmurs: There is no murmur.  ABDOMEN: Soft, non-tender, non-distended EXTREMITIES:  No edema; No deformity   EKG/LABS/ Recent Cardiac Studies   ECG personally reviewed by me today -none completed today  Risk Assessment/Calculations:          Lab Results  Component Value Date   WBC 5.2 05/14/2021   HGB 13.4 05/14/2021   HCT 39.0 05/14/2021   MCV 85.7 05/14/2021   PLT 276 05/14/2021   Lab Results  Component Value Date   CREATININE 0.75 09/07/2023   BUN 15 09/07/2023   NA 141 09/07/2023   K 4.8 09/07/2023   CL 104 09/07/2023   CO2 20 09/07/2023   Lab Results  Component Value Date   CHOL 161 08/20/2022   HDL 62 08/20/2022   LDLCALC 78 08/20/2022   TRIG 117 08/20/2022   CHOLHDL 2.6 08/20/2022    No results found for: "HGBA1C" Assessment & Plan    Assessment & Plan  1.  HFrEF -Ejection fraction decreased to 39%. Fatigue likely due to decreased heart function. Entresto  and spironolactone  prescribed to improve heart function. Goal to increase ejection fraction to 55-60%. - Continue Entresto  49/51 mg twice daily and spironolactone  12.5 mg twice daily as prescribed. - Order echocardiogram for June 6 to assess heart function improvement. - Educate on medication role in improving heart  function and potential need for additional medications based on echocardiogram results.  2.  Essential hypertension: - Patient's blood pressure today was stable at 122/72 -Continue Entresto  49/51 mg twice daily. - Monitor blood pressure regularly and report readings. - Consider increasing Entresto  to maximum dose if blood pressure remains elevated. - Educate on importance of blood pressure control to prevent aortic dilation. - Advise against heavy lifting to prevent increased blood pressure.  3.  Sinus node dysfunction: -Medtronic PPM in situ managed by Dr. Rodolfo Clan -Paceart report with normal battery function and no abnormalities noted  4.  Hyperlipidemia: - Patient's LDL cholesterol was 59 on 29/56/2130 - Continue Lipitor 80 mg daily and ezetimibe  10 mg daily  5.  Aortic dilation: -No increase noted in aortic dilation on previous echocardiogram. Blood pressure control crucial to prevent further dilation. - Continue monitoring aortic dilation with annual echocardiograms. - Educate on avoiding heavy lifting  to prevent increased aortic pressure.  6.  Coronary artery disease: - Patient reports no chest pain or angina since previous follow-up. - Continue current GDMT with ezetimibe  10 mg, Lipitor 80 mg, ASA 81 mg   Disposition: Follow-up with Dorothye Gathers, MD or APP in 6 months    Signed, Francene Ing, Retha Cast, NP 10/15/2023, 6:30 PM [facility]

## 2023-10-16 ENCOUNTER — Ambulatory Visit: Attending: Nurse Practitioner | Admitting: Nurse Practitioner

## 2023-10-16 ENCOUNTER — Encounter: Payer: Self-pay | Admitting: Nurse Practitioner

## 2023-10-16 ENCOUNTER — Other Ambulatory Visit (HOSPITAL_COMMUNITY): Payer: Self-pay

## 2023-10-16 VITALS — BP 122/72 | HR 63 | Ht 61.0 in | Wt 152.0 lb

## 2023-10-16 DIAGNOSIS — I5020 Unspecified systolic (congestive) heart failure: Secondary | ICD-10-CM

## 2023-10-16 DIAGNOSIS — I495 Sick sinus syndrome: Secondary | ICD-10-CM

## 2023-10-16 DIAGNOSIS — I77819 Aortic ectasia, unspecified site: Secondary | ICD-10-CM

## 2023-10-16 DIAGNOSIS — I2510 Atherosclerotic heart disease of native coronary artery without angina pectoris: Secondary | ICD-10-CM

## 2023-10-16 DIAGNOSIS — Z950 Presence of cardiac pacemaker: Secondary | ICD-10-CM

## 2023-10-16 NOTE — Patient Instructions (Signed)
 Medication Instructions:  MAGNESIUM 250 MG  MELATONIN 5 MG  ASHWAGAHANDA  Your physician recommends that you continue on your current medications as directed. Please refer to the Current Medication list given to you today. *If you need a refill on your cardiac medications before your next appointment, please call your pharmacy*  Lab Work: TODAY-CMET & CBC If you have labs (blood work) drawn today and your tests are completely normal, you will receive your results only by: MyChart Message (if you have MyChart) OR A paper copy in the mail If you have any lab test that is abnormal or we need to change your treatment, we will call you to review the results.  Testing/Procedures: NONE ORDERED  Follow-Up: At [facility], you and your health needs are our priority.  As part of our continuing mission to provide you with exceptional heart care, our providers are all part of one team.  This team includes your primary Cardiologist (physician) and Advanced Practice Providers or APPs (Physician Assistants and Nurse Practitioners) who all work together to provide you with the care you need, when you need it.  Your next appointment:   6 month(s)  Provider:   Dorothye Gathers, MD    We recommend signing up for the patient portal called "MyChart".  Sign up information is provided on this After Visit Summary.  MyChart is used to connect with patients for Virtual Visits (Telemedicine).  Patients are able to view lab/test results, encounter notes, upcoming appointments, etc.  Non-urgent messages can be sent to your provider as well.   To learn more about what you can do with MyChart, go to [email or web address].   Other Instructions Check your blood pressure daily for 2 weeks, then contact the office with your readings.  Contact the office either by phone or MyChart with your readings.  Make sure to check your blood pressure 2 hours after taking your medications.   AVOID these things for 30  minutes before checking your blood pressure: No Drinking caffeine. No Drinking alcohol. No Eating. No Smoking. No Exercising.  Five minutes before checking your blood pressure: Pee. Sit in a dining chair. Avoid sitting in a soft couch or armchair. Be quiet. Do not talk.

## 2023-10-17 LAB — CBC
Hematocrit: 41 % (ref 34.0–46.6)
Hemoglobin: 13.3 g/dL (ref 11.1–15.9)
MCH: 29.2 pg (ref 26.6–33.0)
MCHC: 32.4 g/dL (ref 31.5–35.7)
MCV: 90 fL (ref 79–97)
Platelets: 336 10*3/uL (ref 150–450)
RBC: 4.56 x10E6/uL (ref 3.77–5.28)
RDW: 13.1 % (ref 11.7–15.4)
WBC: 7.1 10*3/uL (ref 3.4–10.8)

## 2023-10-17 LAB — COMPREHENSIVE METABOLIC PANEL WITH GFR
ALT: 13 IU/L (ref 0–32)
AST: 17 IU/L (ref 0–40)
Albumin: 4.4 g/dL (ref 3.8–4.8)
Alkaline Phosphatase: 73 IU/L (ref 44–121)
BUN/Creatinine Ratio: 18 (ref 12–28)
BUN: 16 mg/dL (ref 8–27)
Bilirubin Total: 0.7 mg/dL (ref 0.0–1.2)
CO2: 23 mmol/L (ref 20–29)
Calcium: 9.6 mg/dL (ref 8.7–10.3)
Chloride: 104 mmol/L (ref 96–106)
Creatinine, Ser: 0.89 mg/dL (ref 0.57–1.00)
Globulin, Total: 2.3 g/dL (ref 1.5–4.5)
Glucose: 81 mg/dL (ref 70–99)
Potassium: 4.6 mmol/L (ref 3.5–5.2)
Sodium: 141 mmol/L (ref 134–144)
Total Protein: 6.7 g/dL (ref 6.0–8.5)
eGFR: 66 mL/min/{1.73_m2} (ref >59)

## 2023-10-18 ENCOUNTER — Ambulatory Visit: Payer: Self-pay | Admitting: Nurse Practitioner

## 2023-10-20 ENCOUNTER — Other Ambulatory Visit (HOSPITAL_COMMUNITY): Payer: Self-pay

## 2023-10-20 ENCOUNTER — Other Ambulatory Visit: Payer: Self-pay

## 2023-10-28 ENCOUNTER — Ambulatory Visit: Payer: Self-pay | Admitting: Cardiovascular Disease

## 2023-10-30 ENCOUNTER — Other Ambulatory Visit (HOSPITAL_COMMUNITY): Payer: Self-pay

## 2023-10-30 ENCOUNTER — Telehealth: Payer: Self-pay | Admitting: Cardiology

## 2023-10-30 MED ORDER — ENTRESTO 97-103 MG PO TABS
1.0000 | ORAL_TABLET | Freq: Two times a day (BID) | ORAL | 3 refills | Status: DC
  Filled 2023-10-30: qty 180, 90d supply, fill #0
  Filled 2024-01-12: qty 60, 30d supply, fill #1
  Filled 2024-02-11: qty 60, 30d supply, fill #2

## 2023-10-30 NOTE — Addendum Note (Signed)
 Addended by: Reyes Caul, Benjie Ricketson L on: 10/30/2023 03:38 PM   Modules accepted: Orders

## 2023-10-30 NOTE — Telephone Encounter (Signed)
 Patient stopped by to drop off blood pressure readings that are located in Dr Renna Cary mail box. Patient was confused regarding her medication for the blood pressure. Per Triage send telephone note.Patient did not want to leave without speaking to someone due to dosage concern. Informed pt someone would reach out.

## 2023-10-30 NOTE — Telephone Encounter (Signed)
 Patient stated she is suppose to have Entresto  increased to the highest dose. Patient have us  a log of her BPs since last visit with Charles Connor NP. Patient stated she needs to be on the higher dose, due to her BP still being elevated. Consulted with Renelda Carry, and he recommend patient go up on her Entresto  to the highest dose, and patient will need to keep a log of her BPs. Patient verbalized understanding.

## 2023-11-04 ENCOUNTER — Other Ambulatory Visit: Payer: Self-pay

## 2023-11-06 ENCOUNTER — Telehealth: Payer: Self-pay | Admitting: Nurse Practitioner

## 2023-11-06 ENCOUNTER — Ambulatory Visit (HOSPITAL_COMMUNITY)
Admission: RE | Admit: 2023-11-06 | Discharge: 2023-11-06 | Disposition: A | Discharge status: Home / Self Care | Source: Ambulatory Visit | Attending: Internal Medicine | Admitting: Internal Medicine

## 2023-11-06 DIAGNOSIS — I5020 Unspecified systolic (congestive) heart failure: Secondary | ICD-10-CM | POA: Diagnosis not present

## 2023-11-06 LAB — ECHOCARDIOGRAM COMPLETE
Area-P 1/2: 3.62 cm2
S' Lateral: 3.5 cm

## 2023-11-06 NOTE — Telephone Encounter (Signed)
 Blood pressure readings are stable.  Please verify that patient is taking Entresto  97/103 mg twice daily.  Charles Connor, NP

## 2023-11-06 NOTE — Telephone Encounter (Signed)
 Left message for the patient to contact the office to confirm if she is taking Entresto .

## 2023-11-09 ENCOUNTER — Ambulatory Visit: Payer: Self-pay | Admitting: Physician Assistant

## 2023-11-09 DIAGNOSIS — I5020 Unspecified systolic (congestive) heart failure: Secondary | ICD-10-CM

## 2023-11-10 ENCOUNTER — Other Ambulatory Visit: Payer: Self-pay

## 2023-11-10 ENCOUNTER — Other Ambulatory Visit (HOSPITAL_COMMUNITY): Payer: Self-pay

## 2023-11-10 MED ORDER — CARVEDILOL 3.125 MG PO TABS
3.1250 mg | ORAL_TABLET | Freq: Two times a day (BID) | ORAL | 1 refills | Status: DC
  Filled 2023-11-10: qty 180, 90d supply, fill #0
  Filled 2023-11-12: qty 60, 30d supply, fill #0
  Filled 2023-11-20: qty 24, 12d supply, fill #0
  Filled 2023-12-09: qty 60, 30d supply, fill #1
  Filled 2024-01-12: qty 60, 30d supply, fill #2

## 2023-11-10 NOTE — Telephone Encounter (Signed)
-----   Message from Marlyse Single sent at 11/10/2023  9:58 AM EDT ----- Results sent to [name] via [employer].   PLAN: - Start Carvedilol 3.125 mg twice daily  - Arrange follow up with [name] in 1 month  I have reviewed your results for [name] while she is out of the office. Your echocardiogram shows that your ejection fraction (heart function) remains low. I reviewed this with Dr. Renna Cary as well. We need to continue to try to add medications that will be beneficial to improve heart function.  You are already on Entresto  and Spironolactone . Other medications that will help are called beta blockers and SGLT2 inhibitors. We will start by adding Carvedilol 3.125 mg twice daily. This is a beta blocker. I will make sure you have follow up in a month to consider starting an SGLT2 inhibitor (Jardiance or [place]).  Marlyse Single, PA-C    11/10/2023 9:51 AM

## 2023-11-10 NOTE — Telephone Encounter (Signed)
 The patient has been notified of the result and verbalized understanding.  All questions (if any) were answered.  Pt is aware we will start her on carvedilol 3.125 mg po bid and schedule her a follow-up appt to see either Lovette Rud PA-C or Charles Connor NP, for consideration of starting SGLT2 inhibitors (Jardiance/Farxiga).  Pt aware the coreg will be in addition to her other cardiac medications she is taking.  Confirmed the pharmacy of choice with the pt.   Pt mentioned she prefers being scheduled on a Friday with either Deneice Finland or Renelda Carry.  Only availability on a Friday for that time is with Charles Connor NP, whom pt has recently seen for follow-up.   Scheduled the pt a one month follow-up appt with Charles Connor NP for Friday 12/18/23 at 2:20 pm.  Pt aware to arrive 15 mins prior to this appt.   Pt verbalized understanding and agrees with this plan.

## 2023-11-12 ENCOUNTER — Other Ambulatory Visit: Payer: Self-pay

## 2023-11-12 ENCOUNTER — Other Ambulatory Visit (HOSPITAL_COMMUNITY): Payer: Self-pay

## 2023-11-13 ENCOUNTER — Other Ambulatory Visit: Payer: Self-pay

## 2023-11-14 ENCOUNTER — Other Ambulatory Visit (HOSPITAL_COMMUNITY): Payer: Self-pay

## 2023-11-19 NOTE — Addendum Note (Signed)
 Addended by: Lott Rouleau A on: 11/19/2023 08:52 AM   Modules accepted: Orders

## 2023-11-19 NOTE — Progress Notes (Signed)
 Remote pacemaker transmission.

## 2023-11-20 ENCOUNTER — Telehealth: Payer: Self-pay

## 2023-11-20 ENCOUNTER — Other Ambulatory Visit (HOSPITAL_COMMUNITY): Payer: Self-pay

## 2023-11-20 ENCOUNTER — Other Ambulatory Visit: Payer: Self-pay

## 2023-11-20 DIAGNOSIS — E875 Hyperkalemia: Secondary | ICD-10-CM | POA: Diagnosis not present

## 2023-11-20 DIAGNOSIS — Z79899 Other long term (current) drug therapy: Secondary | ICD-10-CM | POA: Diagnosis not present

## 2023-11-20 NOTE — Telephone Encounter (Signed)
 Please let [name] know that her blood pressures are elevated in the mornings however normalized throughout the day. Heart rate is stable and we recommend continuing your current dose of medications for now.  Please let us  know if you have any questions.  Charles Connor, NP

## 2023-11-20 NOTE — Telephone Encounter (Signed)
 Patient came in to the office to pay her bill and asked about getting an additional eleven days of coreg . I told the patient from our end the rx was sent. I advised the patient to go to the pharmacy on the first floor and seek help there.  Spoken with a pharmacy from the pharmacy pool and was informed that they gave the patient a 12 day supply since she had received her pill pack before the refill was sent.   Left patient a message seeking confirmation that she got the coreg . Advised the patient to contact the office with any questions or concerns.

## 2023-11-21 ENCOUNTER — Ambulatory Visit: Payer: Self-pay | Admitting: Cardiology

## 2023-11-21 LAB — BASIC METABOLIC PANEL WITH GFR
BUN/Creatinine Ratio: 35 — ABNORMAL HIGH (ref 12–28)
BUN: 29 mg/dL — ABNORMAL HIGH (ref 8–27)
CO2: 22 mmol/L (ref 20–29)
Calcium: 9.5 mg/dL (ref 8.7–10.3)
Chloride: 104 mmol/L (ref 96–106)
Creatinine, Ser: 0.84 mg/dL (ref 0.57–1.00)
Glucose: 64 mg/dL — ABNORMAL LOW (ref 70–99)
Potassium: 5 mmol/L (ref 3.5–5.2)
Sodium: 142 mmol/L (ref 134–144)
eGFR: 71 mL/min/{1.73_m2} (ref >59)

## 2023-11-23 ENCOUNTER — Other Ambulatory Visit: Payer: Self-pay

## 2023-11-23 ENCOUNTER — Other Ambulatory Visit (HOSPITAL_COMMUNITY): Payer: Self-pay

## 2023-12-03 DIAGNOSIS — J3489 Other specified disorders of nose and nasal sinuses: Secondary | ICD-10-CM | POA: Diagnosis not present

## 2023-12-03 DIAGNOSIS — Z6829 Body mass index (BMI) 29.0-29.9, adult: Secondary | ICD-10-CM | POA: Diagnosis not present

## 2023-12-03 DIAGNOSIS — H6690 Otitis media, unspecified, unspecified ear: Secondary | ICD-10-CM | POA: Diagnosis not present

## 2023-12-09 ENCOUNTER — Other Ambulatory Visit: Payer: Self-pay

## 2023-12-10 ENCOUNTER — Other Ambulatory Visit: Payer: Self-pay

## 2023-12-11 DIAGNOSIS — R300 Dysuria: Secondary | ICD-10-CM | POA: Diagnosis not present

## 2023-12-11 DIAGNOSIS — H6121 Impacted cerumen, right ear: Secondary | ICD-10-CM | POA: Diagnosis not present

## 2023-12-11 DIAGNOSIS — H9201 Otalgia, right ear: Secondary | ICD-10-CM | POA: Diagnosis not present

## 2023-12-11 DIAGNOSIS — Z6829 Body mass index (BMI) 29.0-29.9, adult: Secondary | ICD-10-CM | POA: Diagnosis not present

## 2023-12-18 ENCOUNTER — Ambulatory Visit: Admitting: Nurse Practitioner

## 2023-12-21 ENCOUNTER — Telehealth: Payer: Self-pay | Admitting: Cardiology

## 2023-12-21 ENCOUNTER — Telehealth: Payer: Self-pay | Admitting: Nurse Practitioner

## 2023-12-21 NOTE — Telephone Encounter (Signed)
 I spoke with patient.  She weighs every AM and reports the following weights  6/27- 151.3 lbs 7/20-156.3 lbs 7/21- 154.6 lbs  She is not home and does not have the weights from 6/28-7/19 available but reports weight gain has been gradual.    No shortness of breath most of the time but will sometimes have shortness of breath when lying on her left side.  Feels a little more tired in the last week. No swelling. Had diarrhea and felt weak yesterday morning but diarrhea is gone today Ate at [employer] Saturday but this was unusual for her.  Usually does not eat out or have take out food or frozen meals. Is taking medications as listed. Has appointment with Orren Fabry, PA on August 4.  Will send to Dr Jeffrie for review/recommendations.

## 2023-12-21 NOTE — Telephone Encounter (Signed)
 Paper Work Dropped Off:  BP Log  Date:12/21/23  Location of paper:  [employer]

## 2023-12-21 NOTE — Telephone Encounter (Signed)
 Pt states that she has gained 5 lbs since the 6/27 but has not noticed any swelling anywhere. Has not had any hanges in eating habits or anything else. Requesting cb to discuss

## 2023-12-22 NOTE — Telephone Encounter (Signed)
 Jeffrie Oneil BROCKS, MD to Me  Theotis Sharlet PARAS, RN  [facility] Triage     12/22/23 11:09 AM Okay to continue with current plan.  No changes made.

## 2023-12-22 NOTE — Telephone Encounter (Signed)
 Left message to call office

## 2023-12-22 NOTE — Telephone Encounter (Signed)
 Left message for patient to call back

## 2023-12-23 ENCOUNTER — Other Ambulatory Visit: Payer: Self-pay

## 2023-12-23 NOTE — Telephone Encounter (Addendum)
 Spoke to pt, she reported weight gain (see call-in note) on 7/21 and was directed by Dr. Jeffrie to not make any changes at this time. Pt reports now only gaining 0.3lbs since that call. We discussed that she will continue to monitor, stay away from salty foods. Answered all questions on hydrating with her diuretic.   Pt also mentioned that she has been monitoring her BP daily and recently brought in her July readings for our review. She states her BP was 100/69 yesterday morning when she woke up, but after med dosing it dropped to 81/59. She was dizzy with this reading, she hydrated and sat down with feet up and started feeling better. She hydrates well daily. BP this morning 135/59. When asking if she has had other low readings lately, she says yes and they are noted on her log she brought into our office.   12/21/23 note states most recent BP log is in Wyn Manus, NP's mailbox, will move to Dr. Jeffrie mailbox for review. Please advise after reviewing blood pressures. It looks like she only had one other SBP under 100 on 6/27, do you want her to just continue hydrating and monitoring?

## 2023-12-23 NOTE — Telephone Encounter (Signed)
 Patient returned RN's call.

## 2023-12-24 ENCOUNTER — Other Ambulatory Visit: Payer: Self-pay

## 2023-12-24 NOTE — Addendum Note (Signed)
 Addended by: THEOTIS SHARLET PARAS on: 12/24/2023 04:32 PM   Modules accepted: Orders

## 2023-12-24 NOTE — Telephone Encounter (Signed)
 Pt is aware to discontinue carvedilol  3.125 mg.  All questions, if any were answered at the time of the call.  She is hopeful her fatigue will improve if not resolve.

## 2023-12-31 NOTE — Progress Notes (Signed)
 Cardiology Office Note:  .   Date:  01/04/2024  ID:  BOBBY RAGAN, DOB 14-Nov-1945, MRN [id number] PCP: Okey Carlin Redbird, MD  [facility] Providers Cardiologist:  Oneil Parchment, MD {  History of Present Illness: .   [name] is a 78 y.o. female with a past medical history of coronary artery disease presents for follow-up appointment.  History includes cardiac catheterization 2019 revealing 30 to 40% stenosis in the ostial left main and left-sided dominant circumflex with diffuse 60 to 70% proximal narrowing.  Continues to take atorvastatin  80 mg, Zetia  10 mg and aspirin  81 mg daily.  LDL was 59 mg/dL on March 31, 2023 meeting goal of less than 70.  She has a pacemaker due to a left bundle branch block and sinus node dysfunction.  Pacemaker was relocated to the right side to accommodate radiation therapy for prior breast cancer.  She was about 6 months out from needing a repeat pacemaker which would be her third.  Blood pressure was previously low leading to discontinuation of Maxide however blood pressure readings at home have been 130/70.  Continues to take Maxide 75/50 mg daily and did not stop medication despite previous advice.  Echocardiogram performed August 11, 2022 showed low normal pump function with ejection fraction of 50% no significant valvular disease.  The aorta was mildly dilated at 41 mm.  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  Also takes her dog for walks which she finds comforting.  I saw her in March of this year, she presents with a history of heart failure, hypertension, and breast cancer. She presents today with concerns about her high blood pressure and fatigue. She reports that her blood pressure has been consistently high, despite her usual diet and lifestyle habits. She also reports feeling taxed and pulled down, with decreased energy and stamina. She has noticed difficulty breathing when lying  on her side at night, which is a new symptom for her. She has been managing this by elevating her bed and doing deep breathing exercises. She is not currently experiencing any other symptoms of heart failure. She is due to start new medications, Entresto  and Spironolactone , and has been advised to monitor her blood pressure at home.  Reports no shortness of breath nor dyspnea on exertion. Reports no chest pain, pressure, or tightness. No edema,  PND. Reports no palpitations.   Discussed the use of AI scribe software for clinical note transcription with the patient, who gave verbal consent to proceed.  Today, she presents with heart failure and ascending aortic aneurysm here with dizziness and lightheadedness.  Dizziness and lightheadedness occur primarily in the mornings, with a sensation of being jittery. These symptoms started 12 days ago after discontinuing her beta blocker due to fatigue and shortness of breath. No significant changes in symptoms have been observed since stopping the medication.  Her ejection fraction is 35-40%. She is on Entresto , which has been adjusted previously due to blood pressure fluctuations. She has gained approximately 6.5 pounds over the past few months without visible swelling. Her spironolactone  dose was reduced due to elevated potassium levels.  She has mild aortic enlargement, previously measured at 41 mm, requiring ongoing monitoring. She has a pacemaker and is due for a follow-up regarding its status. There is a family history of congestive heart failure  Reports no chest pain, pressure, or tightness. No edema, orthopnea, PND. Reports no palpitations.   Discussed the use of AI scribe  software for clinical note transcription with the patient, who gave verbal consent to proceed.  ROS: pertinent ROS in HPI  Studies Reviewed: .        Echo 11/06/23 IMPRESSIONS     1. Left ventricular ejection fraction, by estimation, is 35 to 40%. Left  ventricular  ejection fraction by 3D volume is 54 %. The left ventricle has  moderately decreased function. The left ventricle demonstrates global  hypokinesis. The left ventricular  internal cavity size was mildly dilated. There is mild concentric left  ventricular hypertrophy. Left ventricular diastolic parameters are  consistent with Grade I diastolic dysfunction (impaired relaxation). The  average left ventricular global  longitudinal strain is -21.6 %.   2. Right ventricular systolic function is normal. The right ventricular  size is normal.   3. Left atrial size was moderately dilated.   4. The mitral valve is normal in structure. Trivial mitral valve  regurgitation. No evidence of mitral stenosis.   5. The aortic valve is normal in structure. Aortic valve regurgitation is  not visualized. No aortic stenosis is present.   6. Aortic dilatation noted. There is mild dilatation of the ascending  aorta, measuring 41 mm.   7. The inferior vena cava is normal in size with greater than 50%  respiratory variability, suggesting right atrial pressure of 3 mmHg.     Echo 08/07/23 IMPRESSIONS     1. Left ventricular ejection fraction, by estimation, is 40 to 45%. Left  ventricular ejection fraction by 3D volume is 39 %. The left ventricle has  mildly decreased function. The left ventricle demonstrates global  hypokinesis. Left ventricular diastolic   parameters are consistent with Grade I diastolic dysfunction (impaired  relaxation). The average left ventricular global longitudinal strain is  -13.8 %. The global longitudinal strain is abnormal.   2. Right ventricular systolic function is normal. The right ventricular  size is normal. There is normal pulmonary artery systolic pressure. The  estimated right ventricular systolic pressure is 24.5 mmHg.   3. The mitral valve is normal in structure. Trivial mitral valve  regurgitation. No evidence of mitral stenosis.   4. The aortic valve is tricuspid.  Aortic valve regurgitation is not  visualized. Aortic valve sclerosis/calcification is present, without any  evidence of aortic stenosis.   5. Aortic dilatation noted. There is mild dilatation of the ascending  aorta, measuring 44 mm.   6. The inferior vena cava is normal in size with greater than 50%  respiratory variability, suggesting right atrial pressure of 3 mmHg.   FINDINGS   Left Ventricle: Left ventricular ejection fraction, by estimation, is 40  to 45%. Left ventricular ejection fraction by 3D volume is 39 %. The left  ventricle has mildly decreased function. The left ventricle demonstrates  global hypokinesis. The average  left ventricular global longitudinal strain is -13.8 %. Strain was  performed and the global longitudinal strain is abnormal. The left  ventricular internal cavity size was normal in size. There is no left  ventricular hypertrophy. Abnormal (paradoxical)  septal motion, consistent with RV pacemaker. Left ventricular diastolic  parameters are consistent with Grade I diastolic dysfunction (impaired  relaxation). Normal left ventricular filling pressure.   Right Ventricle: The right ventricular size is normal. No increase in  right ventricular wall thickness. Right ventricular systolic function is  normal. There is normal pulmonary artery systolic pressure. The tricuspid  regurgitant velocity is 2.32 m/s, and   with an assumed right atrial pressure of 3 mmHg, the  estimated right  ventricular systolic pressure is 24.5 mmHg.   Left Atrium: Left atrial size was normal in size.   Right Atrium: Right atrial size was normal in size.   Pericardium: There is no evidence of pericardial effusion.   Mitral Valve: The mitral valve is normal in structure. Trivial mitral  valve regurgitation. No evidence of mitral valve stenosis.   Tricuspid Valve: The tricuspid valve is normal in structure. Tricuspid  valve regurgitation is trivial. No evidence of tricuspid  stenosis.   Aortic Valve: The aortic valve is tricuspid. Aortic valve regurgitation is  not visualized. Aortic valve sclerosis/calcification is present, without  any evidence of aortic stenosis.   Pulmonic Valve: The pulmonic valve was normal in structure. Pulmonic valve  regurgitation is trivial. No evidence of pulmonic stenosis.   Aorta: Aortic dilatation noted. There is mild dilatation of the ascending  aorta, measuring 44 mm.   Venous: The inferior vena cava is normal in size with greater than 50%  respiratory variability, suggesting right atrial pressure of 3 mmHg.   IAS/Shunts: No atrial level shunt detected by color flow Doppler.   Additional Comments: 3D was performed not requiring image post processing  on an independent workstation and was abnormal. A device lead is  visualized.  VS:  There were no vitals taken for this visit.   Wt Readings from Last 3 Encounters:  10/16/23 152 lb (68.9 kg)  08/14/23 152 lb 12.8 oz (69.3 kg)  07/31/23 150 lb (68 kg)    GEN: Well nourished, well developed in no acute distress NECK: No JVD; No carotid bruits CARDIAC: RRR, no murmurs, rubs, gallops RESPIRATORY:  Clear to auscultation without rales, wheezing or rhonchi  ABDOMEN: Soft, non-tender, non-distended EXTREMITIES:  No edema; No deformity   ASSESSMENT AND PLAN: .    Heart failure with reduced ejection fraction with fluid retention and dizziness Heart failure with reduced ejection fraction, fluid retention, and dizziness. Blood pressure and heart rate management challenging due to low readings. Current medications include Entresto  and spironolactone . Jardiance or Farxiga  considered for fluid management without affecting potassium levels. Discussed potential side effects of Jardiance/Farxiga , including risk of UTIs, and need for verification with urologist before starting. - Prescribe Farxiga  10mg  daily - Adjust Entresto  dosage if blood pressure remains low.(Her BP trend on paperwork  looked great with most values > 100 SBP) - Schedule close follow-up to monitor response to medication changes.  Aortic root dilation (mild, stable) Mild aortic root dilation, currently well-managed. Echocardiogram shows consistent size with previous findings. No immediate intervention required. - Schedule CT scan in six months to monitor aortic root dilation.  Coronary artery disease, mild Mild coronary artery disease, previously assessed in 2019 with cardiac catheterization. Current management focuses on optimizing heart failure treatment and monitoring for changes.  Cardiac pacemaker management Pacemaker management required, with upcoming need for evaluation and potential replacement. Transition to Dr. Almetta due to Dr. Celine absence. - Schedule appointment with Dr. Almetta for pacemaker evaluation.  Urinary tract infection, under treatment Currently under treatment for urinary tract infection with antibiotics. Concerns about potential exacerbation with new medication (Jardiance/Farxiga ) due to diuretic effect and risk of UTIs. Shared decision-making to verify with urologist before starting new medication. - Verify with urologist regarding starting Jardiance/Farxiga . She has an appointment tomorrow.    Dispo: Follow-up with Dr. Jeffrie  Signed, Orren LOISE Fabry, PA-C

## 2024-01-04 ENCOUNTER — Encounter: Payer: Self-pay | Admitting: Physician Assistant

## 2024-01-04 ENCOUNTER — Other Ambulatory Visit: Payer: Self-pay

## 2024-01-04 ENCOUNTER — Telehealth: Payer: Self-pay

## 2024-01-04 ENCOUNTER — Ambulatory Visit: Attending: Physician Assistant | Admitting: Physician Assistant

## 2024-01-04 ENCOUNTER — Other Ambulatory Visit (HOSPITAL_COMMUNITY): Payer: Self-pay

## 2024-01-04 VITALS — BP 90/62 | HR 62 | Ht 61.0 in | Wt 156.6 lb

## 2024-01-04 DIAGNOSIS — R438 Other disturbances of smell and taste: Secondary | ICD-10-CM | POA: Diagnosis not present

## 2024-01-04 DIAGNOSIS — I77819 Aortic ectasia, unspecified site: Secondary | ICD-10-CM

## 2024-01-04 DIAGNOSIS — H9193 Unspecified hearing loss, bilateral: Secondary | ICD-10-CM | POA: Diagnosis not present

## 2024-01-04 DIAGNOSIS — I10 Essential (primary) hypertension: Secondary | ICD-10-CM | POA: Diagnosis not present

## 2024-01-04 DIAGNOSIS — R0982 Postnasal drip: Secondary | ICD-10-CM | POA: Diagnosis not present

## 2024-01-04 DIAGNOSIS — I5020 Unspecified systolic (congestive) heart failure: Secondary | ICD-10-CM | POA: Diagnosis not present

## 2024-01-04 DIAGNOSIS — Z79899 Other long term (current) drug therapy: Secondary | ICD-10-CM

## 2024-01-04 DIAGNOSIS — H6121 Impacted cerumen, right ear: Secondary | ICD-10-CM | POA: Diagnosis not present

## 2024-01-04 DIAGNOSIS — J3489 Other specified disorders of nose and nasal sinuses: Secondary | ICD-10-CM | POA: Diagnosis not present

## 2024-01-04 MED ORDER — DAPAGLIFLOZIN PROPANEDIOL 10 MG PO TABS
10.0000 mg | ORAL_TABLET | Freq: Every day | ORAL | 11 refills | Status: AC
  Filled 2024-01-04 – 2024-01-12 (×2): qty 30, 30d supply, fill #0
  Filled 2024-02-11 – 2024-02-16 (×2): qty 30, 30d supply, fill #1
  Filled 2024-03-09 – 2024-03-10 (×2): qty 30, 30d supply, fill #2
  Filled 2024-04-12: qty 30, 30d supply, fill #3
  Filled 2024-05-13: qty 30, 30d supply, fill #4
  Filled 2024-06-13: qty 30, 30d supply, fill #5

## 2024-01-04 MED ORDER — DAPAGLIFLOZIN PROPANEDIOL 10 MG PO TABS: 10.0000 mg | ORAL_TABLET | Freq: Every day | ORAL | 0 refills | Status: AC

## 2024-01-04 NOTE — Telephone Encounter (Signed)
 Farxiga  10 mg by mouth daily samples is ready for a patient.

## 2024-01-04 NOTE — Telephone Encounter (Signed)
 Medication name/dosage: Samples List: Farxiga  10 mg  Administration instructions:   Reason for samples: Reason for samples: new start  Ordering provider: Orren Fabry, PA-C  *Once above information entered, route the phone encounter to [facility] SAMPLES and send Teams message to team member assigned to Samples for the day.

## 2024-01-04 NOTE — Patient Instructions (Signed)
 Medication Instructions:  Your physician has recommended you make the following change in your medication:  START FARXIGA  10 MG DAILY.  *If you need a refill on your cardiac medications before your next appointment, please call your pharmacy*  Lab Work: TO BE DONE BEFORE CTA: BMET If you have labs (blood work) drawn today and your tests are completely normal, you will receive your results only by: MyChart Message (if you have MyChart) OR A paper copy in the mail If you have any lab test that is abnormal or we need to change your treatment, we will call you to review the results.  Testing/Procedures: Non-Cardiac CT Angiography (CTA), is a special type of CT scan that uses a computer to produce multi-dimensional views of major blood vessels throughout the body. In CT angiography, a contrast material is injected through an IV to help visualize the blood vessels   Follow-Up: At [facility], you and your health needs are our priority.  As part of our continuing mission to provide you with exceptional heart care, our providers are all part of one team.  This team includes your primary Cardiologist (physician) and Advanced Practice Providers or APPs (Physician Assistants and Nurse Practitioners) who all work together to provide you with the care you need, when you need it.  Your next appointment:   3 month(s)  Provider:   Oneil Parchment, MD   We recommend signing up for the patient portal called MyChart.  Sign up information is provided on this After Visit Summary.  MyChart is used to connect with patients for Virtual Visits (Telemedicine).  Patients are able to view lab/test results, encounter notes, upcoming appointments, etc.  Non-urgent messages can be sent to your provider as well.   To learn more about what you can do with MyChart, go to [email or web address].   Other Instructions CONTINUE TO CHECK BLOOD PRESSURE, HEART RATE AND WEIGHT DAILY.

## 2024-01-05 ENCOUNTER — Other Ambulatory Visit: Payer: Self-pay

## 2024-01-05 ENCOUNTER — Ambulatory Visit (INDEPENDENT_AMBULATORY_CARE_PROVIDER_SITE_OTHER): Payer: Medicare HMO

## 2024-01-05 DIAGNOSIS — N3941 Urge incontinence: Secondary | ICD-10-CM | POA: Diagnosis not present

## 2024-01-05 DIAGNOSIS — R350 Frequency of micturition: Secondary | ICD-10-CM | POA: Diagnosis not present

## 2024-01-05 DIAGNOSIS — I495 Sick sinus syndrome: Secondary | ICD-10-CM

## 2024-01-11 LAB — CUP PACEART REMOTE DEVICE CHECK
Battery Impedance: 1476 Ohm
Battery Remaining Longevity: 33 mo
Battery Voltage: 2.75 V
Brady Statistic AP VP Percent: 1 %
Brady Statistic AP VS Percent: 87 %
Brady Statistic AS VP Percent: 0 %
Brady Statistic AS VS Percent: 12 %
Date Time Interrogation Session: 20250809102901
Implantable Lead Connection Status: 753985
Implantable Lead Connection Status: 753985
Implantable Lead Implant Date: 19970926
Implantable Lead Implant Date: 19970926
Implantable Lead Location: 753859
Implantable Lead Location: 753860
Implantable Lead Model: 4024
Implantable Lead Model: 4524
Implantable Pulse Generator Implant Date: 20171214
Lead Channel Impedance Value: 318 Ohm
Lead Channel Impedance Value: 832 Ohm
Lead Channel Pacing Threshold Amplitude: 0.875 V
Lead Channel Pacing Threshold Amplitude: 1.375 V
Lead Channel Pacing Threshold Pulse Width: 0.4 ms
Lead Channel Pacing Threshold Pulse Width: 0.4 ms
Lead Channel Setting Pacing Amplitude: 2.5 V
Lead Channel Setting Pacing Amplitude: 2.5 V
Lead Channel Setting Pacing Pulse Width: 0.4 ms
Lead Channel Setting Sensing Sensitivity: 4 mV
Zone Setting Status: 755011
Zone Setting Status: 755011

## 2024-01-12 ENCOUNTER — Other Ambulatory Visit: Payer: Self-pay

## 2024-01-14 ENCOUNTER — Other Ambulatory Visit: Payer: Self-pay

## 2024-01-19 DIAGNOSIS — R350 Frequency of micturition: Secondary | ICD-10-CM | POA: Diagnosis not present

## 2024-01-19 DIAGNOSIS — N390 Urinary tract infection, site not specified: Secondary | ICD-10-CM | POA: Diagnosis not present

## 2024-01-20 ENCOUNTER — Telehealth: Payer: Self-pay | Admitting: Physician Assistant

## 2024-01-20 DIAGNOSIS — I10 Essential (primary) hypertension: Secondary | ICD-10-CM

## 2024-01-20 DIAGNOSIS — Z79899 Other long term (current) drug therapy: Secondary | ICD-10-CM

## 2024-01-20 DIAGNOSIS — I5020 Unspecified systolic (congestive) heart failure: Secondary | ICD-10-CM

## 2024-01-20 NOTE — Telephone Encounter (Signed)
 Patient states she continues to gain weight despite being on Farxiga  and spironolactone . She denies any swelling. She has gained 5.2 lbs in 15 days. She does have SOB with activity and feels more fatigued.  Patient states at her office visit with Orren she was told to call if she continues to gain weight to discuss other medication options.  She denies any change in diet or fluid intake. She states she is urinating frequently, but continues to gain weight.  Will forward message to Orren Fabry, PA-C to review and advise.  She requests any long-term medication be sent to [facility], and short-term or if needed to start right away to [facility] [address].

## 2024-01-20 NOTE — Telephone Encounter (Signed)
 Pt c/o swelling/edema: STAT if pt has developed SOB within 24 hours  If swelling, where is the swelling located? no  How much weight have you gained and in what time span? 5.2 pounds in 15 days  Have you gained 2 pounds in a day or 5 pounds in a week? no  Do you have a log of your daily weights (if so, list)? Yes   Are you currently taking a fluid pill? She's not sure, she states that she is on Farxiga  and Spironolactone   Are you currently SOB? At times  Have you traveled recently in a car or plane for an extended period of time? No  Patient states that she came in on 08/04 to see Orren Fabry, PA for weight gain and was put on Farxiga  10mg  for possible build up of fluid around her heart. She wants to know if she should switch medications since she continues to gain weight.

## 2024-01-21 ENCOUNTER — Telehealth: Payer: Self-pay

## 2024-01-21 NOTE — Telephone Encounter (Signed)
 Patient was instructed to bring her BP and weight record in today for T Conte.  Put in Conte's mailbox in the back on [address] fl.  01-21-24 VB

## 2024-01-22 MED ORDER — FUROSEMIDE 40 MG PO TABS: 40.0000 mg | ORAL_TABLET | Freq: Every day | ORAL | 3 refills | Status: AC

## 2024-01-22 NOTE — Telephone Encounter (Signed)
 Call to patient to discuss [name]'s recommendations.  Advised patient to take furosemide  40 mg for the next 3 days in a row to get some of the excess fluid off, and after that she can take it as needed.  Orders placed for Monday including BMP and BNP at any [facility]. Unfortunately patient is traveling to [facility] over the weekend, meds called to pharmacy in that area with patient's permission.   Asked patient to weigh daily in the morning after using the restroom and before eating breakfast and to avoid sodium in her diet. Patient verbalizes understanding to go to ED if she continues to gain weight over the weekend.   I did ask patient to provide her recent BP readings. She states she dropped off her readings at our office yesterday and no longer has them. I checked Alexis Box as well as epic and was not able to find any readings. Asked patient to track readings over the weekend.   [facility] message sent with summary of instructions.

## 2024-01-25 ENCOUNTER — Telehealth: Payer: Self-pay | Admitting: Physician Assistant

## 2024-01-25 DIAGNOSIS — I5020 Unspecified systolic (congestive) heart failure: Secondary | ICD-10-CM | POA: Diagnosis not present

## 2024-01-25 DIAGNOSIS — Z79899 Other long term (current) drug therapy: Secondary | ICD-10-CM | POA: Diagnosis not present

## 2024-01-25 DIAGNOSIS — I10 Essential (primary) hypertension: Secondary | ICD-10-CM | POA: Diagnosis not present

## 2024-01-25 NOTE — Telephone Encounter (Signed)
 Patient dropped off a BP log for [name]'s review - I will be placing this log in Alexis's mailbox this afternoon.  Thank you.

## 2024-01-26 ENCOUNTER — Ambulatory Visit: Payer: Self-pay | Admitting: Physician Assistant

## 2024-01-26 LAB — PRO B NATRIURETIC PEPTIDE: NT-Pro BNP: 105 pg/mL (ref 0–738)

## 2024-01-26 LAB — BASIC METABOLIC PANEL WITH GFR
BUN/Creatinine Ratio: 26 (ref 12–28)
BUN: 26 mg/dL (ref 8–27)
CO2: 25 mmol/L (ref 20–29)
Calcium: 9.5 mg/dL (ref 8.7–10.3)
Chloride: 102 mmol/L (ref 96–106)
Creatinine, Ser: 1 mg/dL (ref 0.57–1.00)
Glucose: 85 mg/dL (ref 70–99)
Potassium: 4.8 mmol/L (ref 3.5–5.2)
Sodium: 139 mmol/L (ref 134–144)
eGFR: 57 mL/min/1.73 — ABNORMAL LOW (ref >59)

## 2024-02-05 ENCOUNTER — Other Ambulatory Visit: Payer: Self-pay

## 2024-02-05 ENCOUNTER — Telehealth: Payer: Self-pay | Admitting: Physician Assistant

## 2024-02-05 ENCOUNTER — Other Ambulatory Visit: Payer: Self-pay | Admitting: Physician Assistant

## 2024-02-05 NOTE — Telephone Encounter (Signed)
 Pt c/o BP issue: STAT if pt c/o blurred vision, one-sided weakness or slurred speech.  STAT if BP is GREATER than 180/120 TODAY.  STAT if BP is LESS than 90/60 and SYMPTOMATIC TODAY  1. What is your BP concern?  97/63 93/57 93/59  89/66  2. Have you taken any BP medication today? Yes  3. What are your last 5 BP readings?  4. Are you having any other symptoms (ex. Dizziness, headache, blurred vision, passed out)? Lightheaded at times   Pt states that she would also like to get a refill on Entresto  49-51 MG as well

## 2024-02-05 NOTE — Telephone Encounter (Signed)
 Forwarding to provider.

## 2024-02-08 ENCOUNTER — Other Ambulatory Visit: Payer: Self-pay

## 2024-02-08 MED ORDER — SACUBITRIL-VALSARTAN 49-51 MG PO TABS
1.0000 | ORAL_TABLET | Freq: Two times a day (BID) | ORAL | 11 refills | Status: AC
  Filled 2024-02-16 – 2024-02-19 (×3): qty 60, 30d supply, fill #0
  Filled 2024-03-03: qty 42, 21d supply, fill #0
  Filled 2024-03-03: qty 60, 30d supply, fill #0
  Filled 2024-03-17: qty 42, 21d supply, fill #1
  Filled 2024-03-24: qty 60, 30d supply, fill #1
  Filled 2024-04-12 – 2024-04-20 (×4): qty 60, 30d supply, fill #2
  Filled 2024-05-13 – 2024-05-16 (×3): qty 60, 30d supply, fill #3
  Filled 2024-06-13: qty 60, 30d supply, fill #4

## 2024-02-08 NOTE — Telephone Encounter (Signed)
 Patient is calling back for update. She states that she doesn't have any of the lower dose left. Please advise

## 2024-02-08 NOTE — Telephone Encounter (Signed)
 Left message for patient informing her Orren said OK to send in Rx for lower dosage of Entrest[facility]  [facility]. Rx sent. Provided office number for callback if any questions.

## 2024-02-08 NOTE — Telephone Encounter (Signed)
 Asking to be changed to Entresto  49-51 mg due to her bp's being low last week- Friday reported:  97/63 93/57 93/59  89/66 She was very tired and has felt faint at times due to low bp.    She reports that over the weekend on the lower dose of Entresto  49-51mg . Her lowest bp = 112/71, and she has felt better. She does not have any of the lower dose left and needs prescription called in. [facility] [address]. Informed her that I would send this to her provider and call back with recommendations.   (She reports that she had gained 12 pounds- then she was put on lasix  2 weeks ago. She has lost 4.2 pounds since starting, but it is going slow. She can tell that she is still swollen in her arms and abdomen).

## 2024-02-11 ENCOUNTER — Other Ambulatory Visit: Payer: Self-pay

## 2024-02-12 ENCOUNTER — Other Ambulatory Visit (HOSPITAL_COMMUNITY): Payer: Self-pay

## 2024-02-12 ENCOUNTER — Other Ambulatory Visit: Payer: Self-pay

## 2024-02-16 ENCOUNTER — Other Ambulatory Visit: Payer: Self-pay

## 2024-02-17 ENCOUNTER — Other Ambulatory Visit: Payer: Self-pay

## 2024-02-19 ENCOUNTER — Other Ambulatory Visit: Payer: Self-pay

## 2024-02-22 ENCOUNTER — Other Ambulatory Visit: Payer: Self-pay

## 2024-02-23 ENCOUNTER — Telehealth: Payer: Self-pay | Admitting: Physician Assistant

## 2024-02-23 NOTE — Telephone Encounter (Signed)
 Will review once we have the letter from [facility].

## 2024-02-23 NOTE — Telephone Encounter (Signed)
 Patient walked in and dropped off a letter from [employer] saying her medications are not on their list of covered drugs.  I am leaving a copy of this letter in Dr. Jeffrie mail box and Orren Conte's for their review.  Please reach to to the patient do discuss.  Thank you.

## 2024-02-24 ENCOUNTER — Telehealth: Payer: Self-pay | Admitting: Pharmacy Technician

## 2024-02-24 ENCOUNTER — Other Ambulatory Visit (HOSPITAL_COMMUNITY): Payer: Self-pay

## 2024-02-24 NOTE — Telephone Encounter (Signed)
 [name], CPhT    02/24/24 10:34 AM Note      Entresto  will be covered. Too soon until 03/03/24.   I called the patient and lmom       Staff message was sent to rx med assist pool with the above response.

## 2024-02-24 NOTE — Telephone Encounter (Signed)
    Entresto  will be covered. Too soon until 03/03/24.  I called the patient and lmom

## 2024-02-25 ENCOUNTER — Other Ambulatory Visit: Payer: Self-pay

## 2024-02-29 NOTE — Progress Notes (Signed)
 Remote PPM Transmission

## 2024-03-01 DIAGNOSIS — R350 Frequency of micturition: Secondary | ICD-10-CM | POA: Diagnosis not present

## 2024-03-03 ENCOUNTER — Other Ambulatory Visit: Payer: Self-pay

## 2024-03-03 ENCOUNTER — Other Ambulatory Visit (HOSPITAL_COMMUNITY): Payer: Self-pay

## 2024-03-09 ENCOUNTER — Other Ambulatory Visit: Payer: Self-pay

## 2024-03-10 ENCOUNTER — Other Ambulatory Visit: Payer: Self-pay

## 2024-03-11 DIAGNOSIS — I5032 Chronic diastolic (congestive) heart failure: Secondary | ICD-10-CM | POA: Diagnosis not present

## 2024-03-11 DIAGNOSIS — J0101 Acute recurrent maxillary sinusitis: Secondary | ICD-10-CM | POA: Diagnosis not present

## 2024-03-11 DIAGNOSIS — Z23 Encounter for immunization: Secondary | ICD-10-CM | POA: Diagnosis not present

## 2024-03-11 DIAGNOSIS — J4520 Mild intermittent asthma, uncomplicated: Secondary | ICD-10-CM | POA: Diagnosis not present

## 2024-03-14 ENCOUNTER — Other Ambulatory Visit: Payer: Self-pay

## 2024-03-17 ENCOUNTER — Other Ambulatory Visit (HOSPITAL_COMMUNITY): Payer: Self-pay

## 2024-03-17 ENCOUNTER — Other Ambulatory Visit: Payer: Self-pay

## 2024-03-24 ENCOUNTER — Other Ambulatory Visit: Payer: Self-pay

## 2024-03-24 ENCOUNTER — Other Ambulatory Visit (HOSPITAL_COMMUNITY): Payer: Self-pay

## 2024-03-31 DIAGNOSIS — R059 Cough, unspecified: Secondary | ICD-10-CM | POA: Diagnosis not present

## 2024-03-31 DIAGNOSIS — J019 Acute sinusitis, unspecified: Secondary | ICD-10-CM | POA: Diagnosis not present

## 2024-03-31 DIAGNOSIS — Z6830 Body mass index (BMI) 30.0-30.9, adult: Secondary | ICD-10-CM | POA: Diagnosis not present

## 2024-03-31 DIAGNOSIS — J45909 Unspecified asthma, uncomplicated: Secondary | ICD-10-CM | POA: Diagnosis not present

## 2024-04-03 NOTE — Progress Notes (Signed)
 " Cardiology Office Note   Date:  04/06/2024  ID:  [name], [name] 17-Aug-1945, MRN [id number] PCP: Okey Carlin Redbird, MD  [facility] Providers Cardiologist:  Oneil Parchment, MD Electrophysiologist:  Donnice DELENA Primus, MD   History of Present Illness [name] is a 78 y.o. female with SND s/p LEFT MDT PPM (DOI 02/26/96) with subsequent tunneling to the R side with L breast CA requiring XRT, LBBB, HTN, nonobstructive CAD, and aortic atherosclerosis who presents for device f/u.  History of Present Illness She has a history of heart failure with a current ejection fraction of 35-40%. She is on Lasix , spironolactone , and Entresto , and monitors her blood pressure daily. Over the past four months, she has experienced weight gain of five to eight pounds, attributed to fluid retention, along with episodes of lightheadedness, dizziness, and extreme fatigue, impacting her ability to work. She describes an incident of feeling faint after walking two blocks, requiring rest in her car. Her sleep requirements have increased to eight to ten hours, a change from her previous five to six hours. No difficulty sleeping flat on her back, but she prefers sleeping on her side.  She has a pacemaker with approximately two and a half years of battery life remaining. The device was relocated during radiation therapy for breast cancer. She reports occasional difficulty in locating the device for home monitoring, but it generally functions well.  She has a history of aortic dilation and was previously taken off blood pressure medication, but resumed it after noticing increased blood pressure. She lives on a fixed income and has concerns about the cost of medical supplies, such as a blood pressure monitor.  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 She remains active  in her church and tries to maintain some physical activity by walking her dog and doing light yard work.  ROS: DOE, occasional wt gain   Studies Reviewed  ECG review 04/08/23: APVS 64, PR 142, QRS 152, QT/c 466/480, LBBB 10/09/17: APVS 66, PR 192, QRS 144, QT/c 456/478, LBBB  TTE Result date: 11/06/23  1. Left ventricular ejection fraction, by estimation, is 35 to 40%. Left  ventricular ejection fraction by 3D volume is 54 %. The left ventricle has  moderately decreased function. The left ventricle demonstrates global  hypokinesis. The left ventricular  internal cavity size was mildly dilated. There is mild concentric left  ventricular hypertrophy. Left ventricular diastolic parameters are  consistent with Grade I diastolic dysfunction (impaired relaxation). The  average left ventricular global  longitudinal strain is -21.6 %.   2. Right ventricular systolic function is normal. The right ventricular  size is normal.   3. Left atrial size was moderately dilated.   4. The mitral valve is normal in structure. Trivial mitral valve  regurgitation. No evidence of mitral stenosis.   5. The aortic valve is normal in structure. Aortic valve regurgitation is  not visualized. No aortic stenosis is present.   6. Aortic dilatation noted. There is mild dilatation of the ascending  aorta, measuring 41 mm.   7. The inferior vena cava is normal in size with greater than 50%  respiratory variability, suggesting right atrial pressure of 3 mmHg.   Physical Exam VS:  BP 128/73   Pulse 86   Ht 5' 1 (1.549 m)   Wt 157 lb 4.8 oz (71.4 kg)  SpO2 98%   BMI 29.72 kg/m       Wt Readings from Last 3 Encounters:  04/06/24 157 lb 4.8 oz (71.4 kg)  04/04/24 161 lb (73 kg)  01/04/24 156 lb 9.6 oz (71 kg)    GEN: Well nourished, well developed in no acute distress NECK: No JVD; No carotid bruits CARDIAC: RRR, no murmurs, rubs, gallops RESPIRATORY:  Clear to auscultation without rales, wheezing or  rhonchi  EXTREMITIES:  No edema; No deformity  SKIN: RIGHT infraclavicular incision well healed, no erythema or swelling  Device information PPM: MDT Andrew LITTIE ALVINE, SN [id number], DOI 05/15/16 RA: MDT 4024 CapSure SP, SN [id number], DOI 02/26/96 RV: MDT 4524 CapSure SP, SN [id number], DOI 02/26/96  ASSESSMENT AND PLAN [name] is a 78 y.o. female with SND s/p LEFT MDT PPM (DOI 02/26/96) with subsequent tunneling to the R side with L breast CA requiring XRT, LBBB, HTN, nonobstructive CAD, and aortic atherosclerosis who presents for device f/u.   SND LEFT MDT PPM  LBBB/IVCD HFrEF/NICM Device function stable lead parameters all within normal limits.  2.5 years remaining for device longevity. AP 85.5%, VP 1.1%.  Reviewed recent ECG with QRS 148 and IVCD.  Previously read as LBBB however with R wave in V1-V3 more c/w intramyocardial delay.  LVEF currently 35-40% so no indication for device upgrade for now.  No changes to medication regimen and plan for 1 year follow-up.     Dispo: RTC 1 year  A total of 25 minutes was spent preparing for the patient, reviewing history, performing exam, document encounter, coordinating care and counseling the patient. 15 minutes was spent with direct patient care.   Signed, Donnice DELENA Primus, MD  "

## 2024-04-04 ENCOUNTER — Ambulatory Visit: Payer: Self-pay | Admitting: Cardiology

## 2024-04-04 ENCOUNTER — Ambulatory Visit: Attending: Cardiology | Admitting: Cardiology

## 2024-04-04 ENCOUNTER — Encounter: Payer: Self-pay | Admitting: Cardiology

## 2024-04-04 VITALS — BP 147/82 | HR 65 | Ht 61.0 in | Wt 161.0 lb

## 2024-04-04 DIAGNOSIS — I10 Essential (primary) hypertension: Secondary | ICD-10-CM | POA: Diagnosis not present

## 2024-04-04 DIAGNOSIS — I447 Left bundle-branch block, unspecified: Secondary | ICD-10-CM

## 2024-04-04 DIAGNOSIS — I77819 Aortic ectasia, unspecified site: Secondary | ICD-10-CM | POA: Diagnosis not present

## 2024-04-04 DIAGNOSIS — I5020 Unspecified systolic (congestive) heart failure: Secondary | ICD-10-CM

## 2024-04-04 DIAGNOSIS — Z79899 Other long term (current) drug therapy: Secondary | ICD-10-CM

## 2024-04-04 DIAGNOSIS — Z950 Presence of cardiac pacemaker: Secondary | ICD-10-CM | POA: Diagnosis not present

## 2024-04-04 NOTE — Patient Instructions (Signed)
 Medication Instructions:  The current medical regimen is effective;  continue present plan and medications.  *If you need a refill on your cardiac medications before your next appointment, please call your pharmacy*  Testing/Procedures: Your physician has requested that you have an echocardiogram before seeing Dr Jeffrie in 1 yr. Echocardiography is a painless test that uses sound waves to create images of your heart. It provides your doctor with information about the size and shape of your heart and how well your heart's chambers and valves are working. This procedure takes approximately one hour. There are no restrictions for this procedure. Please do NOT wear cologne, perfume, aftershave, or lotions (deodorant is allowed). Please arrive 15 minutes prior to your appointment time.  Please note: We ask at that you not bring children with you during ultrasound (echo/ vascular) testing. Due to room size and safety concerns, children are not allowed in the ultrasound rooms during exams. Our front office staff cannot provide observation of children in our lobby area while testing is being conducted. An adult accompanying a patient to their appointment will only be allowed in the ultrasound room at the discretion of the ultrasound technician under special circumstances. We apologize for any inconvenience.   Follow-Up: At [facility], you and your health needs are our priority.  As part of our continuing mission to provide you with exceptional heart care, our providers are all part of one team.  This team includes your primary Cardiologist (physician) and Advanced Practice Providers or APPs (Physician Assistants and Nurse Practitioners) who all work together to provide you with the care you need, when you need it.  Your next appointment:   1 year(s)  Provider:   Oneil Jeffrie, MD    We recommend signing up for the patient portal called MyChart.  Sign up information is provided on this After  Visit Summary.  MyChart is used to connect with patients for Virtual Visits (Telemedicine).  Patients are able to view lab/test results, encounter notes, upcoming appointments, etc.  Non-urgent messages can be sent to your provider as well.   To learn more about what you can do with MyChart, go to [email or web address].

## 2024-04-04 NOTE — Progress Notes (Signed)
 Cardiology Office Note:  .   Date:  04/04/2024  ID:  FAITHE ARIOLA, DOB 1946-03-27, MRN [id number] PCP: Okey Carlin Redbird, MD  [facility] Providers Cardiologist:  Oneil Parchment, MD Electrophysiologist:  Donnice DELENA Primus, MD     History of Present Illness: .   [name] is a 78 y.o. female Discussed the use of AI scribe software   History of Present Illness [name] is a 79 year old female with heart failure and a pacemaker who presents for a cardiovascular follow-up.  She has a history of heart failure with an ejection fraction of 35-40% as noted in an echocardiogram from November 06, 2023. She manages her condition with Entresto , spironolactone , and Lasix . She experiences fluctuations in heart function and has been adjusting medication dosages to find the right balance. She is concerned about her heart's pumping ability.  She has a pacemaker, last checked on January 09, 2024, showing atrial pacing with ventricular sensing 87% of the time. The pacemaker was last checked on January 09, 2024, showing atrial pacing with ventricular sensing 87% of the time, and has about two years and nine months of battery life remaining. This is her third pacemaker, relocated due to prior breast cancer treatment involving radiation.  She experiences episodes of dizziness and lightheadedness, particularly when standing in line or after physical exertion, such as walking two blocks. She manages these episodes by resting and practicing deep breathing exercises. These symptoms do not occur frequently but are concerning when they do.  She has noticed a weight increase from 150 to 161 pounds over the past three months despite taking Lasix  daily. She attributes this to fluid retention and plans to continue her current diuretic regimen. She also consumes a daily protein shake with fresh fruit as a meal replacement.  Her social history includes living alone since her husband's passing in December 2024. She has  two daughters living in different cities and has set up a home system for emergencies, including a lockbox for keys and a list of medications and contacts. 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  She reports a mildly dilated ascending aorta, which has been monitored through echocardiograms.      Studies Reviewed: SABRA   EKG Interpretation Date/Time:  Monday April 04 2024 08:49:08 EST Ventricular Rate:  65 PR Interval:  188 QRS Duration:  148 QT Interval:  430 QTC Calculation: 447 R Axis:   96  Text Interpretation: AV PACED RHYTHM When compared with ECG of 08-Apr-2023 15:45, No significant change since last tracing  No significant change since last tracing Confirmed by Parchment Oneil ([id number]) on 04/04/2024 8:58:01 AM    Results DIAGNOSTIC Echocardiogram: Ejection Fraction (EF) 35-40% (11/06/2023) Cardiac catheterization: 30-40% ostial left main stenosis, left anterior descending (LAD) artery wraps around apex, mild luminal irregularities (10/09/2017) Risk Assessment/Calculations:           Physical Exam:   VS:  BP (!) 147/82   Pulse 65   Ht 5' 1 (1.549 m)   Wt 161 lb (73 kg)   SpO2 97%   BMI 30.42 kg/m    Wt Readings from Last 3 Encounters:  04/04/24 161 lb (73 kg)  01/04/24 156 lb 9.6 oz (71 kg)  10/16/23 152 lb (68.9 kg)    GEN: Well nourished, well developed in no acute distress NECK: No JVD; No carotid bruits CARDIAC: RRR, no murmurs,  no rubs, no gallops RESPIRATORY:  Clear to auscultation without rales, wheezing or rhonchi  ABDOMEN: Soft, non-tender, non-distended EXTREMITIES:  No edema; No deformity   ASSESSMENT AND PLAN: .    Assessment and Plan Assessment & Plan Heart failure with reduced ejection fraction Moderately reduced ejection fraction with EF of 35-40%. Symptoms include occasional lightheadedness and dizziness, particularly after exertion. Current  management includes Entresto , Lasix , and spironolactone . Weight gain noted, likely due to fluid retention. Prognosis is stable with potential for years ahead, but condition can change. - Continue Entresto  49/51 mg daily - Continue Lasix  40 mg daily - Continue spironolactone  12.5 mg daily - Monitor weight and fluid status - Encouraged daily blood pressure and oxygen level monitoring  Coronary artery disease Previous cardiac catheterization showing 30-40% stenosis in the left main artery. Current management includes atorvastatin  and aspirin . - Continue atorvastatin  80 mg daily - Continue aspirin  81 mg daily  Mildly dilated ascending aorta Mild dilation of the ascending aorta, previously measured from 41 mm to 44 mm. No immediate concerns, but requires monitoring. - Will monitor with echocardiogram next year  Essential hypertension Blood pressure management is ongoing with current medications. - Continue current antihypertensive regimen - Monitor blood pressure regularly  Cardiac pacemaker management Pacemaker functioning well with atrial pacing and minimal ventricular pacing. Longevity of pacemaker is approximately two years and nine months. - Continue regular follow-up with pacemaker specialist  Left bundle branch block Noted on EKG. No acute changes or concerns at this time.         Dispo: 1 yr  Signed, Oneil Parchment, MD

## 2024-04-05 ENCOUNTER — Ambulatory Visit: Payer: Medicare HMO

## 2024-04-05 DIAGNOSIS — I447 Left bundle-branch block, unspecified: Secondary | ICD-10-CM

## 2024-04-06 ENCOUNTER — Ambulatory Visit: Payer: Self-pay | Admitting: Student in an Organized Health Care Education/Training Program

## 2024-04-06 ENCOUNTER — Encounter: Payer: Self-pay | Admitting: Student in an Organized Health Care Education/Training Program

## 2024-04-06 ENCOUNTER — Ambulatory Visit: Attending: Cardiology | Admitting: Student in an Organized Health Care Education/Training Program

## 2024-04-06 VITALS — BP 128/73 | HR 86 | Ht 61.0 in | Wt 157.3 lb

## 2024-04-06 DIAGNOSIS — I495 Sick sinus syndrome: Secondary | ICD-10-CM

## 2024-04-06 LAB — CUP PACEART REMOTE DEVICE CHECK
Battery Impedance: 1619 Ohm
Battery Remaining Longevity: 30 mo
Battery Voltage: 2.74 V
Brady Statistic AP VP Percent: 1 %
Brady Statistic AP VS Percent: 86 %
Brady Statistic AS VP Percent: 0 %
Brady Statistic AS VS Percent: 13 %
Date Time Interrogation Session: 20251105065021
Implantable Lead Connection Status: 753985
Implantable Lead Connection Status: 753985
Implantable Lead Implant Date: 19970926
Implantable Lead Implant Date: 19970926
Implantable Lead Location: 753859
Implantable Lead Location: 753860
Implantable Lead Model: 4024
Implantable Lead Model: 4524
Implantable Pulse Generator Implant Date: 20171214
Lead Channel Impedance Value: 322 Ohm
Lead Channel Impedance Value: 794 Ohm
Lead Channel Pacing Threshold Amplitude: 0.75 V
Lead Channel Pacing Threshold Amplitude: 1.375 V
Lead Channel Pacing Threshold Pulse Width: 0.4 ms
Lead Channel Pacing Threshold Pulse Width: 0.4 ms
Lead Channel Setting Pacing Amplitude: 2.5 V
Lead Channel Setting Pacing Amplitude: 2.5 V
Lead Channel Setting Pacing Pulse Width: 0.4 ms
Lead Channel Setting Sensing Sensitivity: 4 mV
Zone Setting Status: 755011
Zone Setting Status: 755011

## 2024-04-06 LAB — CUP PACEART INCLINIC DEVICE CHECK
Battery Impedance: 1670 Ohm
Battery Remaining Longevity: 29 mo
Battery Voltage: 2.75 V
Brady Statistic AP VP Percent: 1 %
Brady Statistic AP VS Percent: 85 %
Brady Statistic AS VP Percent: 0 %
Brady Statistic AS VS Percent: 13 %
Date Time Interrogation Session: 20251105144119
Implantable Lead Connection Status: 753985
Implantable Lead Connection Status: 753985
Implantable Lead Implant Date: 19970926
Implantable Lead Implant Date: 19970926
Implantable Lead Location: 753859
Implantable Lead Location: 753860
Implantable Lead Model: 4024
Implantable Lead Model: 4524
Implantable Pulse Generator Implant Date: 20171214
Lead Channel Impedance Value: 314 Ohm
Lead Channel Impedance Value: 826 Ohm
Lead Channel Pacing Threshold Amplitude: 0.75 V
Lead Channel Pacing Threshold Amplitude: 0.75 V
Lead Channel Pacing Threshold Amplitude: 1 V
Lead Channel Pacing Threshold Amplitude: 1.375 V
Lead Channel Pacing Threshold Pulse Width: 0.4 ms
Lead Channel Pacing Threshold Pulse Width: 0.4 ms
Lead Channel Pacing Threshold Pulse Width: 0.4 ms
Lead Channel Pacing Threshold Pulse Width: 1 ms
Lead Channel Sensing Intrinsic Amplitude: 11.2 mV
Lead Channel Sensing Intrinsic Amplitude: 2 mV
Lead Channel Setting Pacing Amplitude: 2.5 V
Lead Channel Setting Pacing Amplitude: 2.5 V
Lead Channel Setting Pacing Pulse Width: 0.4 ms
Lead Channel Setting Sensing Sensitivity: 4 mV
Zone Setting Status: 755011
Zone Setting Status: 755011

## 2024-04-06 NOTE — Patient Instructions (Signed)

## 2024-04-08 NOTE — Progress Notes (Signed)
 Remote PPM Transmission

## 2024-04-11 ENCOUNTER — Other Ambulatory Visit (HOSPITAL_COMMUNITY): Payer: Self-pay

## 2024-04-11 ENCOUNTER — Other Ambulatory Visit: Payer: Self-pay

## 2024-04-11 MED ORDER — ATORVASTATIN CALCIUM 80 MG PO TABS
80.0000 mg | ORAL_TABLET | Freq: Every day | ORAL | 0 refills | Status: AC
  Filled 2024-04-11: qty 30, 30d supply, fill #0
  Filled 2024-05-13: qty 30, 30d supply, fill #1
  Filled 2024-06-13: qty 30, 30d supply, fill #2

## 2024-04-11 MED ORDER — LEVOTHYROXINE SODIUM 75 MCG PO TABS
75.0000 ug | ORAL_TABLET | Freq: Every morning | ORAL | 0 refills | Status: DC
  Filled 2024-04-11: qty 30, 30d supply, fill #0

## 2024-04-11 MED ORDER — OMEPRAZOLE 40 MG PO CPDR
40.0000 mg | DELAYED_RELEASE_CAPSULE | Freq: Every day | ORAL | 1 refills | Status: AC
  Filled 2024-04-11: qty 30, 30d supply, fill #0
  Filled 2024-05-13: qty 30, 30d supply, fill #1
  Filled 2024-06-13: qty 30, 30d supply, fill #2

## 2024-04-11 MED ORDER — EZETIMIBE 10 MG PO TABS
10.0000 mg | ORAL_TABLET | Freq: Every day | ORAL | 0 refills | Status: AC
  Filled 2024-04-11: qty 30, 30d supply, fill #0
  Filled 2024-05-13: qty 30, 30d supply, fill #1
  Filled 2024-06-13: qty 30, 30d supply, fill #2

## 2024-04-12 ENCOUNTER — Other Ambulatory Visit: Payer: Self-pay

## 2024-04-15 ENCOUNTER — Other Ambulatory Visit: Payer: Self-pay

## 2024-04-15 ENCOUNTER — Other Ambulatory Visit (HOSPITAL_COMMUNITY): Payer: Self-pay

## 2024-04-15 DIAGNOSIS — Z91030 Bee allergy status: Secondary | ICD-10-CM | POA: Diagnosis not present

## 2024-04-15 DIAGNOSIS — I10 Essential (primary) hypertension: Secondary | ICD-10-CM | POA: Diagnosis not present

## 2024-04-15 DIAGNOSIS — J019 Acute sinusitis, unspecified: Secondary | ICD-10-CM | POA: Diagnosis not present

## 2024-04-15 DIAGNOSIS — E039 Hypothyroidism, unspecified: Secondary | ICD-10-CM | POA: Diagnosis not present

## 2024-04-15 DIAGNOSIS — Z0000 Encounter for general adult medical examination without abnormal findings: Secondary | ICD-10-CM | POA: Diagnosis not present

## 2024-04-15 DIAGNOSIS — J4520 Mild intermittent asthma, uncomplicated: Secondary | ICD-10-CM | POA: Diagnosis not present

## 2024-04-15 DIAGNOSIS — I5032 Chronic diastolic (congestive) heart failure: Secondary | ICD-10-CM | POA: Diagnosis not present

## 2024-04-15 DIAGNOSIS — R7303 Prediabetes: Secondary | ICD-10-CM | POA: Diagnosis not present

## 2024-04-15 DIAGNOSIS — E782 Mixed hyperlipidemia: Secondary | ICD-10-CM | POA: Diagnosis not present

## 2024-04-15 DIAGNOSIS — Z8719 Personal history of other diseases of the digestive system: Secondary | ICD-10-CM | POA: Diagnosis not present

## 2024-04-15 DIAGNOSIS — Z950 Presence of cardiac pacemaker: Secondary | ICD-10-CM | POA: Diagnosis not present

## 2024-04-15 DIAGNOSIS — I495 Sick sinus syndrome: Secondary | ICD-10-CM | POA: Diagnosis not present

## 2024-04-15 MED ORDER — ALBUTEROL SULFATE HFA 108 (90 BASE) MCG/ACT IN AERS
2.0000 | INHALATION_SPRAY | RESPIRATORY_TRACT | 5 refills | Status: AC
  Filled 2024-04-15: qty 6.7, 17d supply, fill #0
  Filled 2024-04-28: qty 6.7, 17d supply, fill #1
  Filled 2024-05-15: qty 6.7, 17d supply, fill #2
  Filled 2024-06-01: qty 6.7, 17d supply, fill #3
  Filled 2024-06-12: qty 6.7, 17d supply, fill #4
  Filled 2024-07-08: qty 6.7, 17d supply, fill #5

## 2024-04-15 MED ORDER — ALBUTEROL SULFATE (2.5 MG/3ML) 0.083% IN NEBU
3.0000 mL | INHALATION_SOLUTION | Freq: Four times a day (QID) | RESPIRATORY_TRACT | 3 refills | Status: AC
  Filled 2024-04-15: qty 360, 30d supply, fill #0
  Filled 2024-05-11: qty 360, 30d supply, fill #1
  Filled 2024-06-10: qty 360, 30d supply, fill #2

## 2024-04-15 MED ORDER — OMEPRAZOLE 40 MG PO CPDR
DELAYED_RELEASE_CAPSULE | ORAL | 2 refills | Status: AC
  Filled 2024-04-15: qty 45, 90d supply, fill #0
  Filled 2024-04-16 – 2024-04-19 (×2): qty 15, 30d supply, fill #0

## 2024-04-15 MED ORDER — EZETIMIBE 10 MG PO TABS
10.0000 mg | ORAL_TABLET | Freq: Every day | ORAL | 4 refills | Status: AC
  Filled 2024-04-15: qty 90, 90d supply, fill #0

## 2024-04-15 MED ORDER — LEVOTHYROXINE SODIUM 100 MCG PO TABS
100.0000 ug | ORAL_TABLET | Freq: Every morning | ORAL | 4 refills | Status: AC
  Filled 2024-04-15: qty 30, 30d supply, fill #0
  Filled 2024-05-13: qty 30, 30d supply, fill #1
  Filled 2024-06-13: qty 30, 30d supply, fill #2

## 2024-04-15 MED ORDER — ATORVASTATIN CALCIUM 80 MG PO TABS
80.0000 mg | ORAL_TABLET | Freq: Every day | ORAL | 4 refills | Status: AC
  Filled 2024-04-15: qty 90, 90d supply, fill #0

## 2024-04-16 ENCOUNTER — Other Ambulatory Visit (HOSPITAL_COMMUNITY): Payer: Self-pay

## 2024-04-18 ENCOUNTER — Other Ambulatory Visit (HOSPITAL_COMMUNITY): Payer: Self-pay

## 2024-04-18 ENCOUNTER — Other Ambulatory Visit: Payer: Self-pay

## 2024-04-19 ENCOUNTER — Other Ambulatory Visit (HOSPITAL_COMMUNITY): Payer: Self-pay

## 2024-04-19 ENCOUNTER — Other Ambulatory Visit: Payer: Self-pay

## 2024-04-20 ENCOUNTER — Other Ambulatory Visit: Payer: Self-pay

## 2024-04-21 ENCOUNTER — Other Ambulatory Visit: Payer: Self-pay

## 2024-04-29 ENCOUNTER — Other Ambulatory Visit (HOSPITAL_COMMUNITY): Payer: Self-pay

## 2024-04-29 ENCOUNTER — Other Ambulatory Visit: Payer: Self-pay

## 2024-05-12 DIAGNOSIS — Z6830 Body mass index (BMI) 30.0-30.9, adult: Secondary | ICD-10-CM | POA: Diagnosis not present

## 2024-05-12 DIAGNOSIS — R200 Anesthesia of skin: Secondary | ICD-10-CM | POA: Diagnosis not present

## 2024-05-12 DIAGNOSIS — M79603 Pain in arm, unspecified: Secondary | ICD-10-CM | POA: Diagnosis not present

## 2024-05-12 DIAGNOSIS — M542 Cervicalgia: Secondary | ICD-10-CM | POA: Diagnosis not present

## 2024-05-12 DIAGNOSIS — J3489 Other specified disorders of nose and nasal sinuses: Secondary | ICD-10-CM | POA: Diagnosis not present

## 2024-05-12 DIAGNOSIS — R202 Paresthesia of skin: Secondary | ICD-10-CM | POA: Diagnosis not present

## 2024-05-12 DIAGNOSIS — J329 Chronic sinusitis, unspecified: Secondary | ICD-10-CM | POA: Diagnosis not present

## 2024-05-12 DIAGNOSIS — M25511 Pain in right shoulder: Secondary | ICD-10-CM | POA: Diagnosis not present

## 2024-05-12 DIAGNOSIS — I10 Essential (primary) hypertension: Secondary | ICD-10-CM | POA: Diagnosis not present

## 2024-05-13 ENCOUNTER — Other Ambulatory Visit: Payer: Self-pay

## 2024-05-13 DIAGNOSIS — Z1231 Encounter for screening mammogram for malignant neoplasm of breast: Secondary | ICD-10-CM | POA: Diagnosis not present

## 2024-05-15 ENCOUNTER — Other Ambulatory Visit (HOSPITAL_COMMUNITY): Payer: Self-pay

## 2024-05-15 ENCOUNTER — Other Ambulatory Visit: Payer: Self-pay

## 2024-05-16 ENCOUNTER — Other Ambulatory Visit: Payer: Self-pay

## 2024-05-16 ENCOUNTER — Other Ambulatory Visit (HOSPITAL_COMMUNITY): Payer: Self-pay

## 2024-05-24 DIAGNOSIS — Z961 Presence of intraocular lens: Secondary | ICD-10-CM | POA: Diagnosis not present

## 2024-05-24 DIAGNOSIS — H52203 Unspecified astigmatism, bilateral: Secondary | ICD-10-CM | POA: Diagnosis not present

## 2024-05-24 DIAGNOSIS — H26493 Other secondary cataract, bilateral: Secondary | ICD-10-CM | POA: Diagnosis not present

## 2024-05-30 ENCOUNTER — Other Ambulatory Visit: Payer: Self-pay

## 2024-06-01 ENCOUNTER — Other Ambulatory Visit: Payer: Self-pay

## 2024-06-10 ENCOUNTER — Other Ambulatory Visit: Payer: Self-pay

## 2024-06-10 ENCOUNTER — Other Ambulatory Visit (HOSPITAL_COMMUNITY): Payer: Self-pay

## 2024-06-13 ENCOUNTER — Other Ambulatory Visit (HOSPITAL_COMMUNITY): Payer: Self-pay

## 2024-06-13 ENCOUNTER — Other Ambulatory Visit: Payer: Self-pay

## 2024-06-20 ENCOUNTER — Other Ambulatory Visit: Payer: Self-pay

## 2024-06-27 ENCOUNTER — Other Ambulatory Visit: Payer: Self-pay

## 2024-06-27 ENCOUNTER — Other Ambulatory Visit (HOSPITAL_COMMUNITY): Payer: Self-pay

## 2024-07-05 LAB — CUP PACEART REMOTE DEVICE CHECK
Battery Impedance: 1732 Ohm
Battery Remaining Longevity: 29 mo
Battery Voltage: 2.73 V
Brady Statistic AP VP Percent: 1 %
Brady Statistic AP VS Percent: 81 %
Brady Statistic AS VP Percent: 0 %
Brady Statistic AS VS Percent: 17 %
Date Time Interrogation Session: 20260202125306
Implantable Lead Connection Status: 753985
Implantable Lead Connection Status: 753985
Implantable Lead Implant Date: 19970926
Implantable Lead Implant Date: 19970926
Implantable Lead Location: 753859
Implantable Lead Location: 753860
Implantable Lead Model: 4024
Implantable Lead Model: 4524
Implantable Pulse Generator Implant Date: 20171214
Lead Channel Impedance Value: 330 Ohm
Lead Channel Impedance Value: 828 Ohm
Lead Channel Pacing Threshold Amplitude: 0.75 V
Lead Channel Pacing Threshold Amplitude: 1.375 V
Lead Channel Pacing Threshold Pulse Width: 0.4 ms
Lead Channel Pacing Threshold Pulse Width: 0.4 ms
Lead Channel Setting Pacing Amplitude: 2.5 V
Lead Channel Setting Pacing Amplitude: 2.5 V
Lead Channel Setting Pacing Pulse Width: 0.4 ms
Lead Channel Setting Sensing Sensitivity: 4 mV
Zone Setting Status: 755011
Zone Setting Status: 755011

## 2024-07-08 ENCOUNTER — Other Ambulatory Visit: Payer: Self-pay

## 2024-07-08 ENCOUNTER — Other Ambulatory Visit (HOSPITAL_COMMUNITY): Payer: Self-pay

## 2024-10-04 ENCOUNTER — Ambulatory Visit

## 2025-01-03 ENCOUNTER — Ambulatory Visit

## 2025-04-04 ENCOUNTER — Ambulatory Visit

## 2025-07-04 ENCOUNTER — Ambulatory Visit

## 2025-10-03 ENCOUNTER — Ambulatory Visit

## 2026-01-02 ENCOUNTER — Ambulatory Visit

## 2026-04-03 ENCOUNTER — Ambulatory Visit

## 2026-07-03 ENCOUNTER — Ambulatory Visit
